# Patient Record
Sex: Female | Born: 1945
Health system: Southern US, Community
[De-identification: ages and names within clinical notes are randomized; demographics above are authoritative.]

## PROBLEM LIST (undated history)

## (undated) DIAGNOSIS — E785 Hyperlipidemia, unspecified: Secondary | ICD-10-CM

## (undated) DIAGNOSIS — M069 Rheumatoid arthritis, unspecified: Secondary | ICD-10-CM

## (undated) DIAGNOSIS — N289 Disorder of kidney and ureter, unspecified: Secondary | ICD-10-CM

## (undated) DIAGNOSIS — K2970 Gastritis, unspecified, without bleeding: Secondary | ICD-10-CM

## (undated) DIAGNOSIS — M869 Osteomyelitis, unspecified: Secondary | ICD-10-CM

## (undated) DIAGNOSIS — I10 Essential (primary) hypertension: Secondary | ICD-10-CM

## (undated) DIAGNOSIS — T7840XA Allergy, unspecified, initial encounter: Secondary | ICD-10-CM

## (undated) DIAGNOSIS — Z86010 Personal history of colonic polyps: Secondary | ICD-10-CM

## (undated) DIAGNOSIS — M1990 Unspecified osteoarthritis, unspecified site: Secondary | ICD-10-CM

## (undated) DIAGNOSIS — B9681 Helicobacter pylori [H. pylori] as the cause of diseases classified elsewhere: Secondary | ICD-10-CM

## (undated) DIAGNOSIS — J45909 Unspecified asthma, uncomplicated: Secondary | ICD-10-CM

## (undated) DIAGNOSIS — A159 Respiratory tuberculosis unspecified: Secondary | ICD-10-CM

## (undated) DIAGNOSIS — L03211 Cellulitis of face: Secondary | ICD-10-CM

## (undated) DIAGNOSIS — R7989 Other specified abnormal findings of blood chemistry: Secondary | ICD-10-CM

## (undated) DIAGNOSIS — E559 Vitamin D deficiency, unspecified: Secondary | ICD-10-CM

## (undated) DIAGNOSIS — R7689 Other specified abnormal immunological findings in serum: Secondary | ICD-10-CM

## (undated) DIAGNOSIS — M810 Age-related osteoporosis without current pathological fracture: Secondary | ICD-10-CM

## (undated) DIAGNOSIS — M109 Gout, unspecified: Secondary | ICD-10-CM

## (undated) HISTORY — DX: Other specified abnormal findings of blood chemistry: R79.89

## (undated) HISTORY — DX: Allergy, unspecified, initial encounter: T78.40XA

## (undated) HISTORY — PX: UPPER GASTROINTESTINAL ENDOSCOPY: SHX188

## (undated) HISTORY — DX: Vitamin D deficiency, unspecified: E55.9

## (undated) HISTORY — PX: OTHER SURGICAL HISTORY: SHX169

## (undated) HISTORY — PX: BREAST LUMPECTOMY: SHX2

## (undated) HISTORY — DX: Unspecified asthma, uncomplicated: J45.909

## (undated) HISTORY — DX: Cellulitis of face: L03.211

## (undated) HISTORY — DX: Rheumatoid arthritis, unspecified: M06.9

## (undated) HISTORY — DX: Respiratory tuberculosis unspecified: A15.9

## (undated) HISTORY — DX: Helicobacter pylori (H. pylori) as the cause of diseases classified elsewhere: B96.81

## (undated) HISTORY — DX: Personal history of colonic polyps: Z86.010

## (undated) HISTORY — DX: Age-related osteoporosis without current pathological fracture: M81.0

## (undated) HISTORY — DX: Gastritis, unspecified, without bleeding: K29.70

## (undated) HISTORY — PX: COLONOSCOPY: SHX174

## (undated) HISTORY — DX: Hyperlipidemia, unspecified: E78.5

## (undated) HISTORY — PX: JOINT REPLACEMENT: SHX530

## (undated) HISTORY — DX: Osteomyelitis, unspecified: M86.9

## (undated) HISTORY — DX: Essential (primary) hypertension: I10

## (undated) HISTORY — DX: Other specified abnormal immunological findings in serum: R76.89

---

## 2012-11-17 ENCOUNTER — Encounter (HOSPITAL_COMMUNITY): Payer: Self-pay | Admitting: *Deleted

## 2012-11-17 ENCOUNTER — Emergency Department (HOSPITAL_COMMUNITY): Payer: Self-pay

## 2012-11-17 ENCOUNTER — Inpatient Hospital Stay (HOSPITAL_COMMUNITY)
Admission: EM | Admit: 2012-11-17 | Discharge: 2012-11-19 | DRG: 603 | Disposition: A | Discharge status: Home / Self Care | Payer: MEDICAID | Attending: Family Medicine | Admitting: Family Medicine

## 2012-11-17 DIAGNOSIS — N289 Disorder of kidney and ureter, unspecified: Secondary | ICD-10-CM | POA: Diagnosis present

## 2012-11-17 DIAGNOSIS — F17200 Nicotine dependence, unspecified, uncomplicated: Secondary | ICD-10-CM | POA: Diagnosis present

## 2012-11-17 DIAGNOSIS — I10 Essential (primary) hypertension: Secondary | ICD-10-CM | POA: Diagnosis present

## 2012-11-17 DIAGNOSIS — K089 Disorder of teeth and supporting structures, unspecified: Secondary | ICD-10-CM | POA: Diagnosis present

## 2012-11-17 DIAGNOSIS — M129 Arthropathy, unspecified: Secondary | ICD-10-CM | POA: Diagnosis present

## 2012-11-17 DIAGNOSIS — K029 Dental caries, unspecified: Secondary | ICD-10-CM | POA: Diagnosis present

## 2012-11-17 DIAGNOSIS — M109 Gout, unspecified: Secondary | ICD-10-CM | POA: Diagnosis present

## 2012-11-17 DIAGNOSIS — H532 Diplopia: Secondary | ICD-10-CM | POA: Diagnosis present

## 2012-11-17 DIAGNOSIS — L03211 Cellulitis of face: Secondary | ICD-10-CM

## 2012-11-17 DIAGNOSIS — L0201 Cutaneous abscess of face: Principal | ICD-10-CM

## 2012-11-17 HISTORY — DX: Disorder of kidney and ureter, unspecified: N28.9

## 2012-11-17 HISTORY — DX: Unspecified osteoarthritis, unspecified site: M19.90

## 2012-11-17 HISTORY — DX: Essential (primary) hypertension: I10

## 2012-11-17 HISTORY — DX: Cellulitis of face: L03.211

## 2012-11-17 LAB — CBC
HCT: 38 % (ref 36.0–46.0)
MCHC: 35.3 g/dL (ref 30.0–36.0)
MCV: 85.6 fL (ref 78.0–100.0)
Platelets: 343 10*3/uL (ref 150–400)
RDW: 13.5 % (ref 11.5–15.5)

## 2012-11-17 LAB — CBC WITH DIFFERENTIAL/PLATELET
Eosinophils Absolute: 0.1 10*3/uL (ref 0.0–0.7)
Eosinophils Relative: 1 % (ref 0–5)
HCT: 39.8 % (ref 36.0–46.0)
Lymphs Abs: 1.4 10*3/uL (ref 0.7–4.0)
MCH: 30.5 pg (ref 26.0–34.0)
MCV: 85.6 fL (ref 78.0–100.0)
Monocytes Absolute: 0.6 10*3/uL (ref 0.1–1.0)
Platelets: 392 10*3/uL (ref 150–400)
RBC: 4.65 MIL/uL (ref 3.87–5.11)

## 2012-11-17 LAB — CREATININE, SERUM: GFR calc non Af Amer: >90 mL/min (ref >90)

## 2012-11-17 LAB — URINALYSIS, ROUTINE W REFLEX MICROSCOPIC
Leukocytes, UA: NEGATIVE
Nitrite: NEGATIVE
Specific Gravity, Urine: 1.012 (ref 1.005–1.030)
Urobilinogen, UA: 0.2 mg/dL (ref 0.0–1.0)
pH: 7.5 (ref 5.0–8.0)

## 2012-11-17 LAB — COMPREHENSIVE METABOLIC PANEL
ALT: 8 U/L (ref 0–35)
BUN: 5 mg/dL — ABNORMAL LOW (ref 6–23)
CO2: 25 mEq/L (ref 19–32)
Calcium: 9.3 mg/dL (ref 8.4–10.5)
Creatinine, Ser: 0.53 mg/dL (ref 0.50–1.10)
GFR calc Af Amer: >90 mL/min (ref >90)
GFR calc non Af Amer: >90 mL/min (ref >90)
Glucose, Bld: 96 mg/dL (ref 70–99)
Sodium: 139 mEq/L (ref 135–145)
Total Protein: 8.9 g/dL — ABNORMAL HIGH (ref 6.0–8.3)

## 2012-11-17 LAB — URINE MICROSCOPIC-ADD ON

## 2012-11-17 MED ORDER — CLINDAMYCIN PHOSPHATE 600 MG/50ML IV SOLN
600.0000 mg | Freq: Once | INTRAVENOUS | Status: AC
  Administered 2012-11-17: 600 mg via INTRAVENOUS
  Filled 2012-11-17: qty 50

## 2012-11-17 MED ORDER — SODIUM CHLORIDE 0.9 % IJ SOLN
3.0000 mL | Freq: Two times a day (BID) | INTRAMUSCULAR | Status: DC
  Administered 2012-11-17 – 2012-11-19 (×4): 3 mL via INTRAVENOUS

## 2012-11-17 MED ORDER — HYDROCODONE-ACETAMINOPHEN 5-325 MG PO TABS: 1.0000 | ORAL_TABLET | Freq: Once | ORAL | Status: DC

## 2012-11-17 MED ORDER — LABETALOL HCL 5 MG/ML IV SOLN
10.0000 mg | INTRAVENOUS | Status: DC | PRN
  Administered 2012-11-17: 10 mg via INTRAVENOUS
  Filled 2012-11-17 (×2): qty 4

## 2012-11-17 MED ORDER — CLINDAMYCIN PHOSPHATE 600 MG/50ML IV SOLN
600.0000 mg | Freq: Three times a day (TID) | INTRAVENOUS | Status: DC
  Administered 2012-11-18 – 2012-11-19 (×5): 600 mg via INTRAVENOUS
  Filled 2012-11-17 (×7): qty 50

## 2012-11-17 MED ORDER — MORPHINE SULFATE 4 MG/ML IJ SOLN
4.0000 mg | Freq: Once | INTRAMUSCULAR | Status: AC
  Administered 2012-11-17: 4 mg via INTRAVENOUS
  Filled 2012-11-17: qty 1

## 2012-11-17 MED ORDER — HEPARIN SODIUM (PORCINE) 5000 UNIT/ML IJ SOLN
5000.0000 [IU] | Freq: Three times a day (TID) | INTRAMUSCULAR | Status: DC
  Administered 2012-11-17 – 2012-11-18 (×2): 5000 [IU] via SUBCUTANEOUS
  Filled 2012-11-17 (×5): qty 1

## 2012-11-17 MED ORDER — MORPHINE SULFATE 2 MG/ML IJ SOLN: 2.0000 mg | INTRAMUSCULAR | Status: DC | PRN

## 2012-11-17 MED ORDER — LABETALOL HCL 5 MG/ML IV SOLN
10.0000 mg | Freq: Once | INTRAVENOUS | Status: AC
  Administered 2012-11-17: 10 mg via INTRAVENOUS
  Filled 2012-11-17: qty 4

## 2012-11-17 NOTE — ED Notes (Signed)
Called report to unit 5W. 

## 2012-11-17 NOTE — ED Notes (Signed)
Pt here for left side facial swelling that she first noticed this am and it has become worse throughout the day. Small amount of swelling and redness noted to face, reports having upper dental pain in that area. Airway is intact. Hypertensive at triage, manual bp 240/130 pt reports hx of htn but not currently taking meds.

## 2012-11-17 NOTE — H&P (Signed)
[facility] History and Physical  Noralee Dutko YNW:[id number] DOB: 1945-07-30 DOA: 11/17/2012  Referring physician: ED PCP: No PCP Per Patient   Chief Complaint: Facial swelling  HPI: Kristy Oliver is a 67 y.o. female with poor dentition and a history of HTN but not taking meds for > 9 months.  Patient presents to the ED with L facial swelling, redness, and pain.  Patient has chronic dental problems that have been worse for the last day.  In the ED her BP was initially 241/157 which improved to 168/85 with IV labetalol.  Hospitalist has been asked to admit.  Review of Systems: No: SOB, difficulty speaking, cp abd pain, N/V/D, does have several weeks of generalized weakness and episodic side-by-side double vision.  Past Medical History  Diagnosis Date  . Hypertension   . Arthritis   . Renal disorder    Past Surgical History  Procedure Laterality Date  . Breast lumpectomy     Social History:  reports that she has been smoking Cigarettes.  She has been smoking about 0.00 packs per day. She does not have any smokeless tobacco history on file. She reports that  drinks alcohol. She reports that she does not use illicit drugs.  Allergies  Allergen Reactions  . Iodinated Diagnostic Agents     unknown  . Motrin (Ibuprofen)     States she is only allergic to 800 mg formulation  . Shrimp (Shellfish Allergy) Hives, Swelling and Rash    History reviewed. No pertinent family history.  Prior to Admission medications   Medication Sig Start Date End Date Taking? Authorizing Provider  ibuprofen (ADVIL,MOTRIN) 200 MG tablet Take 400 mg by mouth every 6 (six) hours as needed for pain.   Yes Historical Provider, MD   Physical Exam: Filed Vitals:   11/17/12 1845 11/17/12 1900 11/17/12 1915 11/17/12 1935  BP: 194/102 177/90 179/85 168/85  Pulse: 81 77 83   Temp:      TempSrc:      Resp: 20 18 24 19   SpO2: 92% 93% 95% 93%    General:  NAD, resting comfortably in bed Eyes:  PEERLA EOMI ENT: mucous membranes moist, very poor dentition with receeding gum line, no obvious intraoral abscess Neck: supple w/o JVD Cardiovascular: RRR w/o MRG Respiratory: CTA B Abdomen: soft, nt, nd, bs+ Skin: no rash nor lesion Musculoskeletal: MAE, tophaceous gout noted in all extremities Psychiatric: normal tone and affect Neurologic: AAOx3, grossly non-focal  Labs on Admission:  Basic Metabolic Panel:  Recent Labs Lab 11/17/12 1657  NA 139  K 3.0*  CL 101  CO2 25  GLUCOSE 96  BUN 5*  CREATININE 0.53  CALCIUM 9.3   Liver Function Tests:  Recent Labs Lab 11/17/12 1657  AST 18  ALT 8  ALKPHOS 147*  BILITOT 0.4  PROT 8.9*  ALBUMIN 3.6   No results found for this basename: LIPASE, AMYLASE,  in the last 168 hours No results found for this basename: AMMONIA,  in the last 168 hours CBC:  Recent Labs Lab 11/17/12 1657  WBC 11.4*  NEUTROABS 9.3*  HGB 14.2  HCT 39.8  MCV 85.6  PLT 392   Cardiac Enzymes: No results found for this basename: CKTOTAL, CKMB, CKMBINDEX, TROPONINI,  in the last 168 hours  BNP (last 3 results) No results found for this basename: PROBNP,  in the last 8760 hours CBG: No results found for this basename: GLUCAP,  in the last 168 hours  Radiological Exams on Admission: Ct Head Wo  Contrast  11/17/2012   *RADIOLOGY REPORT*  Clinical Data:  Left facial swelling.  Double vision.  CT HEAD WITHOUT CONTRAST CT MAXILLOFACIAL WITHOUT CONTRAST  Technique:  Multidetector CT imaging of the head and maxillofacial structures were performed using the standard protocol without intravenous contrast. Multiplanar CT image reconstructions of the maxillofacial structures were also generated.  Comparison:  None  CT HEAD  Findings: Patchy hypodensity throughout the cerebral white matter bilaterally most consistent with chronic microvascular ischemia. No acute infarct.  Negative for hemorrhage or mass lesion.  Ventricle size is normal.  No midline shift.  No  skull lesion.  IMPRESSION: Moderate to advanced chronic microvascular ischemia in the white matter.  No acute abnormality.  CT MAXILLOFACIAL  Findings:   Extensive dental disease with multiple caries and periapical lucency around multiple teeth. Soft tissue swelling over the left maxilla consistent with cellulitis.  No definite abscess. Infection most likely related to dental infection in the left maxilla.  Negative for fracture.  Chronic sinusitis with mild mucosal edema in the paranasal sinuses. Mastoid sinuses are clear.  Degenerative change in the temporal mandibular joint bilaterally.  Negative for soft tissue mass or edema.  Orbital contents are normal.  The visualized intracranial contents are negative.  IMPRESSION: Extensive dental infection. Cellulitis left maxilla most likely related to dental infection.  Chronic sinusitis  No acute abnormality.   Original Report Authenticated By: Janeece Riggers, M.D.   Ct Maxillofacial Wo Cm  11/17/2012   *RADIOLOGY REPORT*  Clinical Data:  Left facial swelling.  Double vision.  CT HEAD WITHOUT CONTRAST CT MAXILLOFACIAL WITHOUT CONTRAST  Technique:  Multidetector CT imaging of the head and maxillofacial structures were performed using the standard protocol without intravenous contrast. Multiplanar CT image reconstructions of the maxillofacial structures were also generated.  Comparison:  None  CT HEAD  Findings: Patchy hypodensity throughout the cerebral white matter bilaterally most consistent with chronic microvascular ischemia. No acute infarct.  Negative for hemorrhage or mass lesion.  Ventricle size is normal.  No midline shift.  No skull lesion.  IMPRESSION: Moderate to advanced chronic microvascular ischemia in the white matter.  No acute abnormality.  CT MAXILLOFACIAL  Findings:   Extensive dental disease with multiple caries and periapical lucency around multiple teeth. Soft tissue swelling over the left maxilla consistent with cellulitis.  No definite abscess.  Infection most likely related to dental infection in the left maxilla.  Negative for fracture.  Chronic sinusitis with mild mucosal edema in the paranasal sinuses. Mastoid sinuses are clear.  Degenerative change in the temporal mandibular joint bilaterally.  Negative for soft tissue mass or edema.  Orbital contents are normal.  The visualized intracranial contents are negative.  IMPRESSION: Extensive dental infection. Cellulitis left maxilla most likely related to dental infection.  Chronic sinusitis  No acute abnormality.   Original Report Authenticated By: Janeece Riggers, M.D.    EKG: Independently reviewed.  Assessment/Plan Principal Problem:   Facial cellulitis Active Problems:   Poor dentition   HTN (hypertension), malignant   1. Facial cellulitis - putting patient on clindamycin, keeping patient NPO after midnight, Oral surgeon is coming on call tomorrow at 7am he should be consulted at that time to see if he can perform the needed tooth extraction(s) to gain source control of the infection. 2. Malignant HTN - tele monitor, has responded to labetalol, have ordered additional labetalol PRN for SBP > 200 or DBP > 110.  Likely will need to be started on PO HTN meds and get  routine follow up as outpatient for this long term. 3. Gout - needs to be treated long term with meds such as allopurinol, not in acute gout attack at this point so will hold off for now on starting this.    Code Status: Full Code (must indicate code status--if unknown or must be presumed, indicate so) Family Communication: Spoke with grandson at bedside (indicate person spoken with, if applicable, with phone number if by telephone) Disposition Plan: Admit to inpatient (indicate anticipated LOS)  Time spent: 70 min  Mahasin Riviere M. [facility] Pager [phone]  If 7PM-7AM, please contact night-coverage [email or web address] Password [facility] 11/17/2012, 8:12 PM

## 2012-11-17 NOTE — ED Notes (Signed)
Patient states she has a broken upper left tooth that has not been tended to yet. She has left facial swelling with redness. Patient states she has been having double vision left greater than right for approx a month now.

## 2012-11-17 NOTE — ED Notes (Signed)
Family at bedside. 

## 2012-11-17 NOTE — ED Provider Notes (Signed)
History    CSN: [id number] Arrival date & time 11/17/12  1548  First MD Initiated Contact with Patient 11/17/12 1642     Chief Complaint  Patient presents with  . Facial Swelling  . Hypertension   (Consider location/radiation/quality/duration/timing/severity/associated sxs/prior Treatment) HPI Pt with poor dentition and known history of HTN not taking meds >9 months. P/w L facial swelling, redness, and pain. Pt states she has chronic dental that is worse for the last day. No intraoral swelling, SOB, difficulty speaking. No HA, neck pain, SOB, CP, abd pain, N/V/D, focal weakness or numbness. Pt c/o several weeks of generalized weakness and episodic side-by-side double vision. Past Medical History  Diagnosis Date  . Hypertension   . Arthritis   . Renal disorder    Past Surgical History  Procedure Laterality Date  . Breast lumpectomy     History reviewed. No pertinent family history. History  Substance Use Topics  . Smoking status: Current Every Day Smoker    Types: Cigarettes  . Smokeless tobacco: Not on file  . Alcohol Use: Yes     Comment: occ   OB History   Grav Para Term Preterm Abortions TAB SAB Ect Mult Living                 Review of Systems  Constitutional: Negative for fever and chills.  HENT: Positive for facial swelling and dental problem. Negative for sore throat, trouble swallowing, neck pain, neck stiffness and voice change.   Eyes: Positive for visual disturbance.  Respiratory: Negative for shortness of breath and wheezing.   Cardiovascular: Negative for chest pain, palpitations and leg swelling.  Gastrointestinal: Negative for nausea, vomiting, abdominal pain and diarrhea.  Genitourinary: Negative for dysuria, frequency, hematuria and flank pain.  Musculoskeletal: Negative for myalgias and arthralgias.  Skin: Positive for color change. Negative for rash and wound.  Neurological: Negative for dizziness, syncope, weakness, light-headedness, numbness and  headaches.  All other systems reviewed and are negative.    Allergies  Iodinated diagnostic agents; Motrin; and Shrimp  Home Medications   Current Outpatient Rx  Name  Route  Sig  Dispense  Refill  . ibuprofen (ADVIL,MOTRIN) 200 MG tablet   Oral   Take 400 mg by mouth every 6 (six) hours as needed for pain.          BP 168/85  Pulse 83  Temp(Src) 99.5 F (37.5 C) (Oral)  Resp 19  SpO2 93% Physical Exam  Nursing note and vitals reviewed. Constitutional: She is oriented to person, place, and time. She appears well-developed and well-nourished. No distress.  HENT:  Head: Normocephalic and atraumatic.  Mouth/Throat: Oropharynx is clear and moist.  No intraoral swelling. Missing most teeth. Has several fractured superior anterior teeth that are TTP. No swelling or abscess.  +L infraorbital redness, swelling and tenderness. No definite abscess.   Eyes: EOM are normal. Pupils are equal, round, and reactive to light.  Neck: Normal range of motion. Neck supple.  No nuchal rigidity  Cardiovascular: Normal rate and regular rhythm.  Exam reveals no gallop and no friction rub.   No murmur heard. Pulmonary/Chest: Effort normal and breath sounds normal. No respiratory distress. She has no wheezes. She has no rales. She exhibits no tenderness.  Abdominal: Soft. Bowel sounds are normal. She exhibits no distension and no mass. There is no tenderness. There is no rebound and no guarding.  Musculoskeletal: Normal range of motion. She exhibits no edema and no tenderness.  Lymphadenopathy:  She has no cervical adenopathy.  Neurological: She is alert and oriented to person, place, and time.  5/5 motor  Skin: Skin is warm and dry. No rash noted. No erythema.  Psychiatric: She has a normal mood and affect. Her behavior is normal.    ED Course  Procedures (including critical care time) Labs Reviewed  CBC WITH DIFFERENTIAL - Abnormal; Notable for the following:    WBC 11.4 (*)     Neutrophils Relative % 81 (*)    Neutro Abs 9.3 (*)    All other components within normal limits  COMPREHENSIVE METABOLIC PANEL - Abnormal; Notable for the following:    Potassium 3.0 (*)    BUN 5 (*)    Total Protein 8.9 (*)    Alkaline Phosphatase 147 (*)    All other components within normal limits  URINALYSIS, ROUTINE W REFLEX MICROSCOPIC - Abnormal; Notable for the following:    Protein, ur 100 (*)    All other components within normal limits  URINE MICROSCOPIC-ADD ON   Ct Head Wo Contrast  11/17/2012   *RADIOLOGY REPORT*  Clinical Data:  Left facial swelling.  Double vision.  CT HEAD WITHOUT CONTRAST CT MAXILLOFACIAL WITHOUT CONTRAST  Technique:  Multidetector CT imaging of the head and maxillofacial structures were performed using the standard protocol without intravenous contrast. Multiplanar CT image reconstructions of the maxillofacial structures were also generated.  Comparison:  None  CT HEAD  Findings: Patchy hypodensity throughout the cerebral white matter bilaterally most consistent with chronic microvascular ischemia. No acute infarct.  Negative for hemorrhage or mass lesion.  Ventricle size is normal.  No midline shift.  No skull lesion.  IMPRESSION: Moderate to advanced chronic microvascular ischemia in the white matter.  No acute abnormality.  CT MAXILLOFACIAL  Findings:   Extensive dental disease with multiple caries and periapical lucency around multiple teeth. Soft tissue swelling over the left maxilla consistent with cellulitis.  No definite abscess. Infection most likely related to dental infection in the left maxilla.  Negative for fracture.  Chronic sinusitis with mild mucosal edema in the paranasal sinuses. Mastoid sinuses are clear.  Degenerative change in the temporal mandibular joint bilaterally.  Negative for soft tissue mass or edema.  Orbital contents are normal.  The visualized intracranial contents are negative.  IMPRESSION: Extensive dental infection. Cellulitis  left maxilla most likely related to dental infection.  Chronic sinusitis  No acute abnormality.   Original Report Authenticated By: Janeece Riggers, M.D.   Ct Maxillofacial Wo Cm  11/17/2012   *RADIOLOGY REPORT*  Clinical Data:  Left facial swelling.  Double vision.  CT HEAD WITHOUT CONTRAST CT MAXILLOFACIAL WITHOUT CONTRAST  Technique:  Multidetector CT imaging of the head and maxillofacial structures were performed using the standard protocol without intravenous contrast. Multiplanar CT image reconstructions of the maxillofacial structures were also generated.  Comparison:  None  CT HEAD  Findings: Patchy hypodensity throughout the cerebral white matter bilaterally most consistent with chronic microvascular ischemia. No acute infarct.  Negative for hemorrhage or mass lesion.  Ventricle size is normal.  No midline shift.  No skull lesion.  IMPRESSION: Moderate to advanced chronic microvascular ischemia in the white matter.  No acute abnormality.  CT MAXILLOFACIAL  Findings:   Extensive dental disease with multiple caries and periapical lucency around multiple teeth. Soft tissue swelling over the left maxilla consistent with cellulitis.  No definite abscess. Infection most likely related to dental infection in the left maxilla.  Negative for fracture.  Chronic sinusitis  with mild mucosal edema in the paranasal sinuses. Mastoid sinuses are clear.  Degenerative change in the temporal mandibular joint bilaterally.  Negative for soft tissue mass or edema.  Orbital contents are normal.  The visualized intracranial contents are negative.  IMPRESSION: Extensive dental infection. Cellulitis left maxilla most likely related to dental infection.  Chronic sinusitis  No acute abnormality.   Original Report Authenticated By: Janeece Riggers, M.D.   1. Facial cellulitis   2. Uncontrolled hypertension      Date: 11/17/2012  Rate: 79  Rhythm: normal sinus rhythm  QRS Axis: normal  Intervals: normal  ST/T Wave abnormalities:  normal  Conduction Disutrbances:none  Narrative Interpretation:   Old EKG Reviewed: no prev EKG  MDM  Discussed with Dr Julian Reil. Will see in ED and admit. Bp improved with IV labetalol    Loren Racer, MD 11/17/12 [phone]

## 2012-11-18 ENCOUNTER — Inpatient Hospital Stay (HOSPITAL_COMMUNITY): Payer: MEDICAID

## 2012-11-18 LAB — BASIC METABOLIC PANEL
BUN: 5 mg/dL — ABNORMAL LOW (ref 6–23)
Calcium: 8.8 mg/dL (ref 8.4–10.5)
Creatinine, Ser: 0.58 mg/dL (ref 0.50–1.10)
GFR calc non Af Amer: >90 mL/min (ref >90)
Glucose, Bld: 107 mg/dL — ABNORMAL HIGH (ref 70–99)

## 2012-11-18 LAB — CBC
HCT: 36 % (ref 36.0–46.0)
Hemoglobin: 12.7 g/dL (ref 12.0–15.0)
MCH: 30.2 pg (ref 26.0–34.0)
MCHC: 35.3 g/dL (ref 30.0–36.0)
RDW: 13.6 % (ref 11.5–15.5)

## 2012-11-18 MED ORDER — HEPARIN SODIUM (PORCINE) 5000 UNIT/ML IJ SOLN
5000.0000 [IU] | Freq: Three times a day (TID) | INTRAMUSCULAR | Status: DC
  Administered 2012-11-18 – 2012-11-19 (×2): 5000 [IU] via SUBCUTANEOUS
  Filled 2012-11-18 (×5): qty 1

## 2012-11-18 MED ORDER — CHLORHEXIDINE GLUCONATE 0.12 % MT SOLN
15.0000 mL | Freq: Two times a day (BID) | OROMUCOSAL | Status: DC
  Administered 2012-11-18 – 2012-11-19 (×3): 15 mL via OROMUCOSAL
  Filled 2012-11-18 (×5): qty 15

## 2012-11-18 MED ORDER — BIOTENE DRY MOUTH MT LIQD
15.0000 mL | Freq: Two times a day (BID) | OROMUCOSAL | Status: DC
  Administered 2012-11-18 (×2): 15 mL via OROMUCOSAL

## 2012-11-18 NOTE — Progress Notes (Signed)
Pt brought up from ed via stretcher. Pt's grandson at bedside. Pt oriented to room, safety video viewed. VS WDL except bp elevated. Pt given labetalol iv for elevated bp. rn will continue to monitor pt.

## 2012-11-18 NOTE — Consult Note (Signed)
Kristy Oliver is an 67 y.o. female.  NF:AOZH and swelling left cheek.   HPI: Patient began having facial swelling 2 days PTA.   Past Medical History  Diagnosis Date  . Hypertension   . Arthritis   . Renal disorder     Past Surgical History  Procedure Laterality Date  . Breast lumpectomy      History reviewed. No pertinent family history.  Social History:  reports that she has been smoking Cigarettes.  She has been smoking about 0.00 packs per day. She does not have any smokeless tobacco history on file. She reports that  drinks alcohol. She reports that she does not use illicit drugs.  Allergies:  Allergies  Allergen Reactions  . Iodinated Diagnostic Agents     unknown  . Motrin (Ibuprofen)     States she is only allergic to 800 mg formulation  . Shrimp (Shellfish Allergy) Hives, Swelling and Rash    Medications: I have reviewed the patient's current medications.  Results for orders placed during the hospital encounter of 11/17/12 (from the past 48 hour(s))  CBC WITH DIFFERENTIAL     Status: Abnormal   Collection Time    11/17/12  4:57 PM      Result Value Range   WBC 11.4 (*) 4.0 - 10.5 K/uL   RBC 4.65  3.87 - 5.11 MIL/uL   Hemoglobin 14.2  12.0 - 15.0 g/dL   HCT 08.6  57.8 - 46.9 %   MCV 85.6  78.0 - 100.0 fL   MCH 30.5  26.0 - 34.0 pg   MCHC 35.7  30.0 - 36.0 g/dL   RDW 62.9  52.8 - 41.3 %   Platelets 392  150 - 400 K/uL   Neutrophils Relative % 81 (*) 43 - 77 %   Neutro Abs 9.3 (*) 1.7 - 7.7 K/uL   Lymphocytes Relative 12  12 - 46 %   Lymphs Abs 1.4  0.7 - 4.0 K/uL   Monocytes Relative 5  3 - 12 %   Monocytes Absolute 0.6  0.1 - 1.0 K/uL   Eosinophils Relative 1  0 - 5 %   Eosinophils Absolute 0.1  0.0 - 0.7 K/uL   Basophils Relative 1  0 - 1 %   Basophils Absolute 0.1  0.0 - 0.1 K/uL  COMPREHENSIVE METABOLIC PANEL     Status: Abnormal   Collection Time    11/17/12  4:57 PM      Result Value Range   Sodium 139  135 - 145 mEq/L   Potassium 3.0  (*) 3.5 - 5.1 mEq/L   Chloride 101  96 - 112 mEq/L   CO2 25  19 - 32 mEq/L   Glucose, Bld 96  70 - 99 mg/dL   BUN 5 (*) 6 - 23 mg/dL   Creatinine, Ser 2.44  0.50 - 1.10 mg/dL   Calcium 9.3  8.4 - 01.0 mg/dL   Total Protein 8.9 (*) 6.0 - 8.3 g/dL   Albumin 3.6  3.5 - 5.2 g/dL   AST 18  0 - 37 U/L   ALT 8  0 - 35 U/L   Alkaline Phosphatase 147 (*) 39 - 117 U/L   Total Bilirubin 0.4  0.3 - 1.2 mg/dL   GFR calc non Af Amer >90  >90 mL/min   GFR calc Af Amer >90  >90 mL/min   Comment:            The eGFR has been calculated  using the CKD EPI equation.     This calculation has not been     validated in all clinical     situations.     eGFR's persistently     <90 mL/min signify     possible Chronic Kidney Disease.  URINALYSIS, ROUTINE W REFLEX MICROSCOPIC     Status: Abnormal   Collection Time    11/17/12  5:51 PM      Result Value Range   Color, Urine YELLOW  YELLOW   APPearance CLEAR  CLEAR   Specific Gravity, Urine 1.012  1.005 - 1.030   pH 7.5  5.0 - 8.0   Glucose, UA NEGATIVE  NEGATIVE mg/dL   Hgb urine dipstick NEGATIVE  NEGATIVE   Bilirubin Urine NEGATIVE  NEGATIVE   Ketones, ur NEGATIVE  NEGATIVE mg/dL   Protein, ur 782 (*) NEGATIVE mg/dL   Urobilinogen, UA 0.2  0.0 - 1.0 mg/dL   Nitrite NEGATIVE  NEGATIVE   Leukocytes, UA NEGATIVE  NEGATIVE  URINE MICROSCOPIC-ADD ON     Status: None   Collection Time    11/17/12  5:51 PM      Result Value Range   Squamous Epithelial / LPF RARE  RARE   RBC / HPF 3-6  <3 RBC/hpf   Bacteria, UA RARE  RARE  CBC     Status: Abnormal   Collection Time    11/17/12  8:52 PM      Result Value Range   WBC 11.6 (*) 4.0 - 10.5 K/uL   RBC 4.44  3.87 - 5.11 MIL/uL   Hemoglobin 13.4  12.0 - 15.0 g/dL   HCT 95.6  21.3 - 08.6 %   MCV 85.6  78.0 - 100.0 fL   MCH 30.2  26.0 - 34.0 pg   MCHC 35.3  30.0 - 36.0 g/dL   RDW 57.8  46.9 - 62.9 %   Platelets 343  150 - 400 K/uL  CREATININE, SERUM     Status: None   Collection Time    11/17/12   8:52 PM      Result Value Range   Creatinine, Ser 0.51  0.50 - 1.10 mg/dL   GFR calc non Af Amer >90  >90 mL/min   GFR calc Af Amer >90  >90 mL/min   Comment:            The eGFR has been calculated     using the CKD EPI equation.     This calculation has not been     validated in all clinical     situations.     eGFR's persistently     <90 mL/min signify     possible Chronic Kidney Disease.  CBC     Status: Abnormal   Collection Time    11/18/12  5:00 AM      Result Value Range   WBC 11.5 (*) 4.0 - 10.5 K/uL   RBC 4.20  3.87 - 5.11 MIL/uL   Hemoglobin 12.7  12.0 - 15.0 g/dL   HCT 52.8  41.3 - 24.4 %   MCV 85.7  78.0 - 100.0 fL   MCH 30.2  26.0 - 34.0 pg   MCHC 35.3  30.0 - 36.0 g/dL   RDW 01.0  27.2 - 53.6 %   Platelets 349  150 - 400 K/uL  BASIC METABOLIC PANEL     Status: Abnormal   Collection Time    11/18/12  5:00 AM      Result  Value Range   Sodium 137  135 - 145 mEq/L   Potassium 3.0 (*) 3.5 - 5.1 mEq/L   Chloride 100  96 - 112 mEq/L   CO2 26  19 - 32 mEq/L   Glucose, Bld 107 (*) 70 - 99 mg/dL   BUN 5 (*) 6 - 23 mg/dL   Creatinine, Ser 1.61  0.50 - 1.10 mg/dL   Calcium 8.8  8.4 - 09.6 mg/dL   GFR calc non Af Amer >90  >90 mL/min   GFR calc Af Amer >90  >90 mL/min   Comment:            The eGFR has been calculated     using the CKD EPI equation.     This calculation has not been     validated in all clinical     situations.     eGFR's persistently     <90 mL/min signify     possible Chronic Kidney Disease.    Dg Orthopantogram  11/18/2012   *RADIOLOGY REPORT*  Clinical Data: 67 year old female with left facial swelling. Dental caries.  ORTHOPANTOGRAM/PANORAMIC  Comparison: Face CT 11/17/2012.  Findings: Multifocal poor dentition read demonstrated, with dental caries diffusely affecting the maxillary incisors and bicuspid. Large dental caries affecting the right maxillary molar.  Severe dental caries and apical lucency affecting the right mandible molar.   IMPRESSION: Widespread poor dentition.   Original Report Authenticated By: Erskine Speed, M.D.   Ct Head Wo Contrast  11/17/2012   *RADIOLOGY REPORT*  Clinical Data:  Left facial swelling.  Double vision.  CT HEAD WITHOUT CONTRAST CT MAXILLOFACIAL WITHOUT CONTRAST  Technique:  Multidetector CT imaging of the head and maxillofacial structures were performed using the standard protocol without intravenous contrast. Multiplanar CT image reconstructions of the maxillofacial structures were also generated.  Comparison:  None  CT HEAD  Findings: Patchy hypodensity throughout the cerebral white matter bilaterally most consistent with chronic microvascular ischemia. No acute infarct.  Negative for hemorrhage or mass lesion.  Ventricle size is normal.  No midline shift.  No skull lesion.  IMPRESSION: Moderate to advanced chronic microvascular ischemia in the white matter.  No acute abnormality.  CT MAXILLOFACIAL  Findings:   Extensive dental disease with multiple caries and periapical lucency around multiple teeth. Soft tissue swelling over the left maxilla consistent with cellulitis.  No definite abscess. Infection most likely related to dental infection in the left maxilla.  Negative for fracture.  Chronic sinusitis with mild mucosal edema in the paranasal sinuses. Mastoid sinuses are clear.  Degenerative change in the temporal mandibular joint bilaterally.  Negative for soft tissue mass or edema.  Orbital contents are normal.  The visualized intracranial contents are negative.  IMPRESSION: Extensive dental infection. Cellulitis left maxilla most likely related to dental infection.  Chronic sinusitis  No acute abnormality.   Original Report Authenticated By: Janeece Riggers, M.D.   Ct Maxillofacial Wo Cm  11/17/2012   *RADIOLOGY REPORT*  Clinical Data:  Left facial swelling.  Double vision.  CT HEAD WITHOUT CONTRAST CT MAXILLOFACIAL WITHOUT CONTRAST  Technique:  Multidetector CT imaging of the head and maxillofacial  structures were performed using the standard protocol without intravenous contrast. Multiplanar CT image reconstructions of the maxillofacial structures were also generated.  Comparison:  None  CT HEAD  Findings: Patchy hypodensity throughout the cerebral white matter bilaterally most consistent with chronic microvascular ischemia. No acute infarct.  Negative for hemorrhage or mass lesion.  Ventricle size is normal.  No midline shift.  No skull lesion.  IMPRESSION: Moderate to advanced chronic microvascular ischemia in the white matter.  No acute abnormality.  CT MAXILLOFACIAL  Findings:   Extensive dental disease with multiple caries and periapical lucency around multiple teeth. Soft tissue swelling over the left maxilla consistent with cellulitis.  No definite abscess. Infection most likely related to dental infection in the left maxilla.  Negative for fracture.  Chronic sinusitis with mild mucosal edema in the paranasal sinuses. Mastoid sinuses are clear.  Degenerative change in the temporal mandibular joint bilaterally.  Negative for soft tissue mass or edema.  Orbital contents are normal.  The visualized intracranial contents are negative.  IMPRESSION: Extensive dental infection. Cellulitis left maxilla most likely related to dental infection.  Chronic sinusitis  No acute abnormality.   Original Report Authenticated By: Janeece Riggers, M.D.    @ROS @ Blood pressure 172/78, pulse 75, temperature 99.5 F (37.5 C), temperature source Oral, resp. rate 18, height 5\' 1"  (1.549 m), weight 66.2 kg (145 lb 15.1 oz), SpO2 97.00%. General appearance: alert, cooperative and no distress Head: Normocephalic, without obvious abnormality, atraumatic Eyes: negative Ears: normal TM's and external ear canals both ears Nose: Nares normal. Septum midline. Mucosa normal. No drainage or sinus tenderness. Throat: Moderate left canine space edema. Eye not swollen shut. Severe dental caries. No trismus. No purulence, drainage, or  exudate. Neck: no adenopathy, no JVD and supple, symmetrical, trachea midline Resp: clear to auscultation bilaterally Cardio: regular rate and rhythm, S1, S2 normal, no murmur, click, rub or gallop  Assessment/Plan: 66 YO BF HTN, Arthritis, Renal disorder with left canine space infection secondary to dental caries and abscesses. Recommend continue IV or PO Cleocin. Patient will need dental extractions in OR. Will try to schedule for Monday, June 30.   Gordie Belvin M 11/18/2012, 3:08 PM

## 2012-11-18 NOTE — Progress Notes (Signed)
2:36 PM I agree with HPI/GPe and A/P per Dr. Julian Reil  PAtient unhappy at being NPO.  States was told in the ED that a dental surgeon would be by to take care of this issue.  No n/v/cp/sob/blurred or double vision      HEENT-diffuse swelling of the L side of the face.  Poor dentition with multiple missing teeth.    Patient Active Problem List   Diagnosis Date Noted  . Facial cellulitis 11/17/2012  . Poor dentition 11/17/2012  . HTN (hypertension), malignant 11/17/2012   Spoke with Dr. Barbette Merino regarding patient this am.  HE recommend Opthantogram, which has been ordered.  We will probably be able to feed the patient once we hear back from Dr. Barbette Merino.  Further disposition dependant on him  Pleas Koch, MD [facility] [phone]

## 2012-11-19 MED ORDER — CHLORHEXIDINE GLUCONATE 0.12 % MT SOLN: 15.0000 mL | Freq: Two times a day (BID) | OROMUCOSAL | Status: DC

## 2012-11-19 MED ORDER — LISINOPRIL 20 MG PO TABS: 20.0000 mg | ORAL_TABLET | Freq: Every day | ORAL | Status: DC

## 2012-11-19 MED ORDER — CLINDAMYCIN HCL 150 MG PO CAPS: 450.0000 mg | ORAL_CAPSULE | Freq: Three times a day (TID) | ORAL | Status: DC

## 2012-11-19 NOTE — Progress Notes (Signed)
Kristy Oliver to be D/C'd Home per MD order.  Discharge instructions reviewed and discussed with the patient and daughter n law, Okey Dupre., all questions and concerns answered. Copy of instructions and scripts given to patient. Patients skin is clean, dry and intact no evidence of skin break down.  IV site discontinued and catheter remains intact. Site without signs and symptoms of complications. Dressing and pressure applied. Patient escorted to car by NT in a wheelchair,  no distress noted upon discharge.  Nena Polio Garner Nash 11/19/2012

## 2012-11-19 NOTE — Progress Notes (Signed)
   CARE MANAGEMENT NOTE 11/19/2012  Patient:  Kristy Oliver, Kristy Oliver   Account Number:  [id number]  Date Initiated:  11/19/2012  Documentation initiated by:  [facility]  Subjective/Objective Assessment:   cellulitis     Action/Plan:   Anticipated DC Date:  11/19/2012   Anticipated DC Plan:  HOME/SELF CARE      DC Planning Services  CM consult  Medication Assistance  [facility]      Choice offered to / List presented to:             Status of service:  Completed, signed off Medicare Important Message given?   (If response is "NO", the following Medicare IM given date fields will be blank) Date Medicare IM given:   Date Additional Medicare IM given:    Discharge Disposition:  HOME W HOME HEALTH SERVICES  Per UR Regulation:    If discussed at Long Length of Stay Meetings, dates discussed:    Comments:

## 2012-11-19 NOTE — Discharge Summary (Signed)
Physician Discharge Summary  Brandan Glauber BJY:[id number] DOB: November 19, 1945 DOA: 11/17/2012  PCP: No PCP Per Patient  Admit date: 11/17/2012 Discharge date: 11/19/2012  Time spent: 37 minutes  Recommendations for Outpatient Follow-up:  1. Continue clindamycin 450 mg 3 times a day for 10 days or until seen by oral surgeon 2. Restarted his lisinopril 20 mg daily 3. Need to place and basic metabolic panel 1-2 weeks  Discharge Diagnoses:  Principal Problem:   Facial cellulitis Active Problems:   Poor dentition   HTN (hypertension), malignant   Discharge Condition: Good  Diet recommendation: Heart healthy  Filed Weights   11/17/12 2143  Weight: 66.2 kg (145 lb 15.1 oz)    History of present illness:  This 67 year old African American female recently transplanted from [place] presented to the hospital x-ray 6 2014 with chronic dental swelling that was worse that 3 days. She states that she's had dental infection and pain about 7 months ago but neglected to have this looked at. She presented with obvious over left facial swelling extending up to the left eye and puffiness She reports history of being hospitalized for 2-3 months sometime last year for acute renal failure which eventually resolved off of dialysis. On admission she was hypertensive to the 200s and given labetalol. Oral surgeon Dr. Ocie Doyne saw the patient and recommended interval surgery once swelling had resolved in the OR. He recommended continuation of antibiotics and also for her she could be discharged. Case manager has been elicited to help patient with medications, and she states she has no material resources, has a grandson and town but for rest of the relatives are scattered and she may not able to afford her medications Patient understands clearly antibiotics will not be curative in this condition rather she will have to have dental surgery and she is aware of this  Procedures:  Opthantogram 11/19/2012  showed widespread poor dentition with severe dental caries in April of lucency affecting right mandibular molar   Consultations:  Ocie Doyne Oral surgeon  Discharge Exam: Filed Vitals:   11/18/12 0623 11/18/12 1700 11/18/12 2138 11/19/12 0611  BP: 172/78 174/87 153/84 172/80  Pulse: 75 76 70 69  Temp: 99.5 F (37.5 C) 98.7 F (37.1 C) 99 F (37.2 C) 99.1 F (37.3 C)  TempSrc: Oral Oral Oral Oral  Resp: 18 18 18 20   Height:      Weight:      SpO2: 97% 98% 90% 96%   Well sitting up in No distress  General: L facial swelling significantly decreased Cardiovascular:  s1 s2 no m/r/g Respiratory: cta b  Discharge Instructions  Discharge Orders   Future Orders Complete By Expires     Call MD for:  persistant dizziness or light-headedness  As directed     Call MD for:  persistant nausea and vomiting  As directed     Call MD for:  redness, tenderness, or signs of infection (pain, swelling, redness, odor or green/yellow discharge around incision site)  As directed     Call MD for:  severe uncontrolled pain  As directed     Diet - low sodium heart healthy  As directed     Discharge instructions  As directed     Comments:      Need to follow up with Dr. Barbette Merino for Definitive management of your mouth infection-the antibiotics WILL NOT cure this Start back Lisinopril 20 daily-u need labs in about 1 week.  I will send you with 6 refills  Please ensure you take your medications    Increase activity slowly  As directed         Medication List         chlorhexidine 0.12 % solution  Commonly known as:  PERIDEX  Use as directed 15 mLs in the mouth or throat 2 (two) times daily.     clindamycin 150 MG capsule  Commonly known as:  CLEOCIN  Take 3 capsules (450 mg total) by mouth 3 (three) times daily.     ibuprofen 200 MG tablet  Commonly known as:  ADVIL,MOTRIN  Take 400 mg by mouth every 6 (six) hours as needed for pain.     lisinopril 20 MG tablet  Commonly known as:   PRINIVIL,ZESTRIL  Take 1 tablet (20 mg total) by mouth daily.       Allergies  Allergen Reactions  . Iodinated Diagnostic Agents     unknown  . Motrin (Ibuprofen)     States she is only allergic to 800 mg formulation  . Shrimp (Shellfish Allergy) Hives, Swelling and Rash       Follow-up Information   Schedule an appointment as soon as possible for a visit with Georgia Lopes, DDS.   Contact information:   [address] [place] [address] [phone]        The results of significant diagnostics from this hospitalization (including imaging, microbiology, ancillary and laboratory) are listed below for reference.    Significant Diagnostic Studies: Dg Orthopantogram  11/18/2012   *RADIOLOGY REPORT*  Clinical Data: 67 year old female with left facial swelling. Dental caries.  ORTHOPANTOGRAM/PANORAMIC  Comparison: Face CT 11/17/2012.  Findings: Multifocal poor dentition read demonstrated, with dental caries diffusely affecting the maxillary incisors and bicuspid. Large dental caries affecting the right maxillary molar.  Severe dental caries and apical lucency affecting the right mandible molar.  IMPRESSION: Widespread poor dentition.   Original Report Authenticated By: Erskine Speed, M.D.   Ct Head Wo Contrast  11/17/2012   *RADIOLOGY REPORT*  Clinical Data:  Left facial swelling.  Double vision.  CT HEAD WITHOUT CONTRAST CT MAXILLOFACIAL WITHOUT CONTRAST  Technique:  Multidetector CT imaging of the head and maxillofacial structures were performed using the standard protocol without intravenous contrast. Multiplanar CT image reconstructions of the maxillofacial structures were also generated.  Comparison:  None  CT HEAD  Findings: Patchy hypodensity throughout the cerebral white matter bilaterally most consistent with chronic microvascular ischemia. No acute infarct.  Negative for hemorrhage or mass lesion.  Ventricle size is normal.  No midline shift.  No skull lesion.  IMPRESSION:  Moderate to advanced chronic microvascular ischemia in the white matter.  No acute abnormality.  CT MAXILLOFACIAL  Findings:   Extensive dental disease with multiple caries and periapical lucency around multiple teeth. Soft tissue swelling over the left maxilla consistent with cellulitis.  No definite abscess. Infection most likely related to dental infection in the left maxilla.  Negative for fracture.  Chronic sinusitis with mild mucosal edema in the paranasal sinuses. Mastoid sinuses are clear.  Degenerative change in the temporal mandibular joint bilaterally.  Negative for soft tissue mass or edema.  Orbital contents are normal.  The visualized intracranial contents are negative.  IMPRESSION: Extensive dental infection. Cellulitis left maxilla most likely related to dental infection.  Chronic sinusitis  No acute abnormality.   Original Report Authenticated By: Janeece Riggers, M.D.   Ct Maxillofacial Wo Cm  11/17/2012   *RADIOLOGY REPORT*  Clinical Data:  Left facial swelling.  Double  vision.  CT HEAD WITHOUT CONTRAST CT MAXILLOFACIAL WITHOUT CONTRAST  Technique:  Multidetector CT imaging of the head and maxillofacial structures were performed using the standard protocol without intravenous contrast. Multiplanar CT image reconstructions of the maxillofacial structures were also generated.  Comparison:  None  CT HEAD  Findings: Patchy hypodensity throughout the cerebral white matter bilaterally most consistent with chronic microvascular ischemia. No acute infarct.  Negative for hemorrhage or mass lesion.  Ventricle size is normal.  No midline shift.  No skull lesion.  IMPRESSION: Moderate to advanced chronic microvascular ischemia in the white matter.  No acute abnormality.  CT MAXILLOFACIAL  Findings:   Extensive dental disease with multiple caries and periapical lucency around multiple teeth. Soft tissue swelling over the left maxilla consistent with cellulitis.  No definite abscess. Infection most likely  related to dental infection in the left maxilla.  Negative for fracture.  Chronic sinusitis with mild mucosal edema in the paranasal sinuses. Mastoid sinuses are clear.  Degenerative change in the temporal mandibular joint bilaterally.  Negative for soft tissue mass or edema.  Orbital contents are normal.  The visualized intracranial contents are negative.  IMPRESSION: Extensive dental infection. Cellulitis left maxilla most likely related to dental infection.  Chronic sinusitis  No acute abnormality.   Original Report Authenticated By: Janeece Riggers, M.D.    Microbiology: No results found for this or any previous visit (from the past 240 hour(s)).   Labs: Basic Metabolic Panel:  Recent Labs Lab 11/17/12 1657 11/17/12 2052 11/18/12 0500  NA 139  --  137  K 3.0*  --  3.0*  CL 101  --  100  CO2 25  --  26  GLUCOSE 96  --  107*  BUN 5*  --  5*  CREATININE 0.53 0.51 0.58  CALCIUM 9.3  --  8.8   Liver Function Tests:  Recent Labs Lab 11/17/12 1657  AST 18  ALT 8  ALKPHOS 147*  BILITOT 0.4  PROT 8.9*  ALBUMIN 3.6   No results found for this basename: LIPASE, AMYLASE,  in the last 168 hours No results found for this basename: AMMONIA,  in the last 168 hours CBC:  Recent Labs Lab 11/17/12 1657 11/17/12 2052 11/18/12 0500  WBC 11.4* 11.6* 11.5*  NEUTROABS 9.3*  --   --   HGB 14.2 13.4 12.7  HCT 39.8 38.0 36.0  MCV 85.6 85.6 85.7  PLT 392 343 349   Cardiac Enzymes: No results found for this basename: CKTOTAL, CKMB, CKMBINDEX, TROPONINI,  in the last 168 hours BNP: BNP (last 3 results) No results found for this basename: PROBNP,  in the last 8760 hours CBG: No results found for this basename: GLUCAP,  in the last 168 hours     Signed:  Rhetta Mura  [facility] 11/19/2012, 8:14 AM

## 2012-11-19 NOTE — Progress Notes (Signed)
Called Case Manger for medication assistance and left message

## 2012-11-21 ENCOUNTER — Encounter (HOSPITAL_COMMUNITY): Payer: Self-pay | Admitting: Anesthesiology

## 2012-11-21 ENCOUNTER — Ambulatory Visit (HOSPITAL_COMMUNITY): Admission: RE | Admit: 2012-11-21 | Payer: MEDICAID | Source: Ambulatory Visit | Admitting: Oral Surgery

## 2012-11-21 ENCOUNTER — Encounter (HOSPITAL_COMMUNITY): Admission: RE | Payer: Self-pay | Source: Ambulatory Visit

## 2012-11-21 SURGERY — INCISION AND DRAINAGE, ABSCESS
Anesthesia: General | Laterality: Bilateral

## 2012-11-30 ENCOUNTER — Encounter (HOSPITAL_COMMUNITY): Payer: Self-pay | Admitting: Family Medicine

## 2012-11-30 ENCOUNTER — Emergency Department (HOSPITAL_COMMUNITY)
Admission: EM | Admit: 2012-11-30 | Discharge: 2012-11-30 | Disposition: A | Discharge status: Home / Self Care | Payer: Self-pay | Attending: Emergency Medicine | Admitting: Emergency Medicine

## 2012-11-30 DIAGNOSIS — I10 Essential (primary) hypertension: Secondary | ICD-10-CM | POA: Insufficient documentation

## 2012-11-30 DIAGNOSIS — Z79899 Other long term (current) drug therapy: Secondary | ICD-10-CM | POA: Insufficient documentation

## 2012-11-30 DIAGNOSIS — Z8739 Personal history of other diseases of the musculoskeletal system and connective tissue: Secondary | ICD-10-CM | POA: Insufficient documentation

## 2012-11-30 DIAGNOSIS — F17200 Nicotine dependence, unspecified, uncomplicated: Secondary | ICD-10-CM | POA: Insufficient documentation

## 2012-11-30 DIAGNOSIS — Z87448 Personal history of other diseases of urinary system: Secondary | ICD-10-CM | POA: Insufficient documentation

## 2012-11-30 DIAGNOSIS — Z792 Long term (current) use of antibiotics: Secondary | ICD-10-CM | POA: Insufficient documentation

## 2012-11-30 LAB — BASIC METABOLIC PANEL
BUN: 11 mg/dL (ref 6–23)
Calcium: 9.5 mg/dL (ref 8.4–10.5)
Creatinine, Ser: 0.66 mg/dL (ref 0.50–1.10)
GFR calc Af Amer: >90 mL/min (ref >90)
GFR calc non Af Amer: >90 mL/min (ref >90)

## 2012-11-30 NOTE — ED Notes (Signed)
Per pt sts was at the dentist this am to have surgery and was sent here because her blood pressure was too high. sts was around 200 systolic and then 190. sts she took her lisinopril this am.

## 2012-11-30 NOTE — ED Provider Notes (Signed)
History    CSN: [id number] Arrival date & time 11/30/12  1152  First MD Initiated Contact with Patient 11/30/12 1315     Chief Complaint  Patient presents with  . Hypertension   (Consider location/radiation/quality/duration/timing/severity/associated sxs/prior Treatment) HPI Comments: Patient was seen by the oral surgeon this morning and was found to have high blood pressure, pt reports 201/185.  States oral surgeon sent her to the ED to be checked.  Pt states she was feeling fine and was looking forward to getting her teeth pulled.  Pt was seen in ED and admitted to the hospital 11/17/12- she was discharged on Lisinopril 20mg  daily, which she has been taking.  Denies CP, SOB, HA.  She and family member deny any altered mental status.  Per hospital records, pt was to have BMP rechecked in two weeks.  Pt states she does not have a PCP and has not followed up for her BP or the BMP.   Patient is a 67 y.o. female presenting with hypertension. The history is provided by the patient.  Hypertension Pertinent negatives include no abdominal pain, chest pain, coughing, fever, headaches, nausea, numbness, vomiting or weakness.   Past Medical History  Diagnosis Date  . Hypertension   . Arthritis   . Renal disorder    Past Surgical History  Procedure Laterality Date  . Breast lumpectomy     History reviewed. No pertinent family history. History  Substance Use Topics  . Smoking status: Current Every Day Smoker    Types: Cigarettes  . Smokeless tobacco: Not on file  . Alcohol Use: Yes     Comment: occ   OB History   Grav Para Term Preterm Abortions TAB SAB Ect Mult Living                 Review of Systems  Constitutional: Negative for fever.  Respiratory: Negative for cough and shortness of breath.   Cardiovascular: Negative for chest pain.  Gastrointestinal: Negative for nausea, vomiting, abdominal pain and diarrhea.  Neurological: Negative for weakness, numbness and headaches.     Allergies  Iodinated diagnostic agents; Motrin; and Shrimp  Home Medications   Current Outpatient Rx  Name  Route  Sig  Dispense  Refill  . chlorhexidine (PERIDEX) 0.12 % solution   Mouth/Throat   Use as directed 15 mLs in the mouth or throat 2 (two) times daily.   120 mL   0   . clindamycin (CLEOCIN) 150 MG capsule   Oral   Take 3 capsules (450 mg total) by mouth 3 (three) times daily.   63 capsule   0   . ibuprofen (ADVIL,MOTRIN) 200 MG tablet   Oral   Take 400 mg by mouth every 6 (six) hours as needed for pain.         Marland Kitchen lisinopril (PRINIVIL,ZESTRIL) 20 MG tablet   Oral   Take 1 tablet (20 mg total) by mouth daily.   30 tablet   6    BP 164/89  Pulse 59  Temp(Src) 98 F (36.7 C) (Oral)  Resp 17  SpO2 97% Physical Exam  Nursing note and vitals reviewed. Constitutional: She appears well-developed and well-nourished. No distress.  HENT:  Head: Normocephalic and atraumatic.  Neck: Neck supple.  Cardiovascular: Normal rate and regular rhythm.   Pulmonary/Chest: Effort normal and breath sounds normal. No respiratory distress. She has no wheezes. She has no rales.  Abdominal: Soft. She exhibits no distension. There is no tenderness. There is no rebound  and no guarding.  Neurological: She is alert. GCS eye subscore is 4. GCS verbal subscore is 5. GCS motor subscore is 6.  Skin: She is not diaphoretic.    ED Course  Procedures (including critical care time) Labs Reviewed  BASIC METABOLIC PANEL   No results found.  Filed Vitals:   11/30/12 1500  BP: 154/87  Pulse: 55  Temp:   Resp: 16     1. Hypertension     MDM  Pt with HTN and no PCP, recently started on lisinopril.  No symptoms concerning for HTN emergency.  BMP ordered to check renal function.  Pt strongly advised she needs to control her blood pressure - discussed risks of persistently elevated blood pressure. Pt may have had elevated pressure that was situational - being at the dentist about  to have a procedure done.  Her blood pressure was elevated but not alarmingly so in ED.  Pt given PCP resources for follow up.  Pt given return precautions.  Pt verbalizes understanding and agrees with plan.     Trixie Dredge, PA-C 11/30/12 1541

## 2012-11-30 NOTE — ED Notes (Signed)
Report received, assumed care.  

## 2012-12-04 NOTE — ED Provider Notes (Signed)
Medical screening examination/treatment/procedure(s) were performed by non-physician practitioner and as supervising physician I was immediately available for consultation/collaboration.  Nalee Lightle T Pate Aylward, MD 12/04/12 0836 

## 2012-12-26 ENCOUNTER — Ambulatory Visit: Payer: Self-pay | Admitting: Internal Medicine

## 2012-12-26 ENCOUNTER — Ambulatory Visit: Payer: Medicaid Other | Attending: Family Medicine | Admitting: Internal Medicine

## 2012-12-26 ENCOUNTER — Encounter: Payer: Self-pay | Admitting: Internal Medicine

## 2012-12-26 DIAGNOSIS — I10 Essential (primary) hypertension: Secondary | ICD-10-CM

## 2012-12-26 DIAGNOSIS — M069 Rheumatoid arthritis, unspecified: Secondary | ICD-10-CM

## 2012-12-26 MED ORDER — MELOXICAM 15 MG PO TABS: 15.0000 mg | ORAL_TABLET | Freq: Every day | ORAL | Status: DC

## 2012-12-26 MED ORDER — LISINOPRIL 20 MG PO TABS: 20.0000 mg | ORAL_TABLET | Freq: Every day | ORAL | Status: DC

## 2012-12-26 NOTE — Progress Notes (Unsigned)
Pt here to establish care with orange card HX. Rheumatoid Arthritis Pain management

## 2012-12-26 NOTE — Progress Notes (Signed)
Patient ID: Kristy Oliver, female   DOB: 08/20/1945, 67 y.o.   MRN: [id number]  CC:  HPI:\  67 year old [place] American female who recently moved from [place]. Will has a history of premature arthritis and used to be on hydroxychloroquine and Percocet for her pain who is here for evaluation of her severe irregular arthritis. The patient has multiple rheumatoid nodules on her elbow as well as on her hands. She has multiple deformities of her fingers. She also complains of neck pain she denies any chest pain or shortness of breath. She has a history of hypertension and has run out of her lisinopril. She discontinued her DMARD treatment because she ran out of her Medicaid.    Allergies  Allergen Reactions  . Iodinated Diagnostic Agents     unknown  . Motrin (Ibuprofen)     States she is only allergic to 800 mg formulation  . Shrimp (Shellfish Allergy) Hives, Swelling and Rash   Past Medical History  Diagnosis Date  . Hypertension   . Arthritis   . Renal disorder    Current Outpatient Prescriptions on File Prior to Visit  Medication Sig Dispense Refill  . chlorhexidine (PERIDEX) 0.12 % solution Use as directed 15 mLs in the mouth or throat 2 (two) times daily.  120 mL  0  . clindamycin (CLEOCIN) 150 MG capsule Take 3 capsules (450 mg total) by mouth 3 (three) times daily.  63 capsule  0   No current facility-administered medications on file prior to visit.   No family history on file. History   Social History  . Marital Status: Single    Spouse Name: N/A    Number of Children: N/A  . Years of Education: N/A   Occupational History  . Not on file.   Social History Main Topics  . Smoking status: Current Every Day Smoker    Types: Cigarettes  . Smokeless tobacco: Not on file  . Alcohol Use: Yes     Comment: occ  . Drug Use: No  . Sexually Active: Not on file   Other Topics Concern  . Not on file   Social History Narrative  . No narrative on file    Review  of Systems  Constitutional: Negative for fever, chills, diaphoresis, activity change, appetite change and fatigue.  HENT: Negative for ear pain, nosebleeds, congestion, facial swelling, rhinorrhea, neck pain, neck stiffness and ear discharge.   Eyes: Negative for pain, discharge, redness, itching and visual disturbance.  Respiratory: Negative for cough, choking, chest tightness, shortness of breath, wheezing and stridor.   Cardiovascular: Negative for chest pain, palpitations and leg swelling.  Gastrointestinal: Negative for abdominal distention.  Genitourinary: Negative for dysuria, urgency, frequency, hematuria, flank pain, decreased urine volume, difficulty urinating and dyspareunia.  Musculoskeletal: Negative for back pain, joint swelling, arthralgias and gait problem.  Neurological: Negative for dizziness, tremors, seizures, syncope, facial asymmetry, speech difficulty, weakness, light-headedness, numbness and headaches.  Hematological: Negative for adenopathy. Does not bruise/bleed easily.  Psychiatric/Behavioral: Negative for hallucinations, behavioral problems, confusion, dysphoric mood, decreased concentration and agitation.    Objective:  There were no vitals filed for this visit.  Physical Exam  Constitutional: Appears well-developed and well-nourished. No distress.  HENT: Normocephalic. External right and left ear normal. Oropharynx is clear and moist.  Eyes: Conjunctivae and EOM are normal. PERRLA, no scleral icterus.  Neck: Normal ROM. Neck supple. No JVD. No tracheal deviation. No thyromegaly.  CVS: RRR, S1/S2 +, no murmurs, no gallops, no carotid bruit.  Pulmonary: Effort and breath sounds normal, no stridor, rhonchi, wheezes, rales.  Abdominal: Soft. BS +,  no distension, tenderness, rebound or guarding.  Musculoskeletal: Normal range of motion. No edema and no tenderness.  Lymphadenopathy: No lymphadenopathy noted, cervical, inguinal. Neuro: Alert. Normal reflexes, muscle  tone coordination. No cranial nerve deficit. Skin: Skin is warm and dry. No rash noted. Not diaphoretic. No erythema. No pallor.  Psychiatric: Normal mood and affect. Behavior, judgment, thought content normal.   Lab Results  Component Value Date   WBC 11.5* 11/18/2012   HGB 12.7 11/18/2012   HCT 36.0 11/18/2012   MCV 85.7 11/18/2012   PLT 349 11/18/2012   Lab Results  Component Value Date   CREATININE 0.66 11/30/2012   BUN 11 11/30/2012   NA 137 11/30/2012   K 3.8 11/30/2012   CL 103 11/30/2012   CO2 24 11/30/2012    No results found for this basename: HGBA1C   Lipid Panel  No results found for this basename: chol, trig, hdl, cholhdl, vldl, ldlcalc       Assessment and plan:   Patient Active Problem List   Diagnosis Date Noted  . Facial cellulitis 11/17/2012  . Poor dentition 11/17/2012  . HTN (hypertension), malignant 11/17/2012        Rheumatoid arthritis Check anti-CCP antibody ESR, PPD testing Chest x-ray C-spine x-ray Rheumatology referral Patient come back and have her PPD read Meloxicam for pain Eventually she does need to start dmard  Treatment as quickly as possible Followup in 2 weeks

## 2012-12-27 LAB — COMPREHENSIVE METABOLIC PANEL
Albumin: 4 g/dL (ref 3.5–5.2)
Alkaline Phosphatase: 144 U/L — ABNORMAL HIGH (ref 39–117)
BUN: 13 mg/dL (ref 6–23)
CO2: 25 mEq/L (ref 19–32)
Glucose, Bld: 77 mg/dL (ref 70–99)
Potassium: 4.6 mEq/L (ref 3.5–5.3)
Sodium: 138 mEq/L (ref 135–145)
Total Protein: 8.6 g/dL — ABNORMAL HIGH (ref 6.0–8.3)

## 2012-12-27 LAB — CYCLIC CITRUL PEPTIDE ANTIBODY, IGG: Cyclic Citrullin Peptide Ab: >300 U/mL — ABNORMAL HIGH (ref 0.0–5.0)

## 2012-12-28 ENCOUNTER — Ambulatory Visit: Payer: No Typology Code available for payment source | Attending: Family Medicine

## 2013-01-09 ENCOUNTER — Ambulatory Visit (HOSPITAL_COMMUNITY)
Admission: RE | Admit: 2013-01-09 | Discharge: 2013-01-09 | Disposition: A | Discharge status: Home / Self Care | Payer: No Typology Code available for payment source | Source: Ambulatory Visit | Attending: Internal Medicine | Admitting: Internal Medicine

## 2013-01-09 ENCOUNTER — Ambulatory Visit: Payer: No Typology Code available for payment source | Attending: Internal Medicine

## 2013-01-09 DIAGNOSIS — M069 Rheumatoid arthritis, unspecified: Secondary | ICD-10-CM | POA: Insufficient documentation

## 2013-01-09 DIAGNOSIS — I10 Essential (primary) hypertension: Secondary | ICD-10-CM | POA: Insufficient documentation

## 2013-01-09 DIAGNOSIS — Z96619 Presence of unspecified artificial shoulder joint: Secondary | ICD-10-CM | POA: Insufficient documentation

## 2013-01-09 DIAGNOSIS — M439 Deforming dorsopathy, unspecified: Secondary | ICD-10-CM | POA: Insufficient documentation

## 2013-01-09 DIAGNOSIS — M542 Cervicalgia: Secondary | ICD-10-CM | POA: Insufficient documentation

## 2013-03-09 DIAGNOSIS — M109 Gout, unspecified: Secondary | ICD-10-CM | POA: Insufficient documentation

## 2013-06-30 ENCOUNTER — Emergency Department (HOSPITAL_COMMUNITY): Payer: Medicare Other

## 2013-06-30 ENCOUNTER — Encounter (HOSPITAL_COMMUNITY): Payer: Self-pay | Admitting: Emergency Medicine

## 2013-06-30 ENCOUNTER — Inpatient Hospital Stay (HOSPITAL_COMMUNITY)
Admission: EM | Admit: 2013-06-30 | Discharge: 2013-07-03 | DRG: 682 | Disposition: A | Discharge status: Home / Self Care | Payer: Medicare Other | Attending: Internal Medicine | Admitting: Internal Medicine

## 2013-06-30 DIAGNOSIS — M069 Rheumatoid arthritis, unspecified: Secondary | ICD-10-CM | POA: Diagnosis present

## 2013-06-30 DIAGNOSIS — K089 Disorder of teeth and supporting structures, unspecified: Secondary | ICD-10-CM

## 2013-06-30 DIAGNOSIS — R109 Unspecified abdominal pain: Secondary | ICD-10-CM | POA: Diagnosis not present

## 2013-06-30 DIAGNOSIS — R930 Abnormal findings on diagnostic imaging of skull and head, not elsewhere classified: Secondary | ICD-10-CM | POA: Diagnosis not present

## 2013-06-30 DIAGNOSIS — N179 Acute kidney failure, unspecified: Principal | ICD-10-CM

## 2013-06-30 DIAGNOSIS — F1210 Cannabis abuse, uncomplicated: Secondary | ICD-10-CM | POA: Diagnosis present

## 2013-06-30 DIAGNOSIS — I10 Essential (primary) hypertension: Secondary | ICD-10-CM

## 2013-06-30 DIAGNOSIS — D259 Leiomyoma of uterus, unspecified: Secondary | ICD-10-CM | POA: Diagnosis present

## 2013-06-30 DIAGNOSIS — R197 Diarrhea, unspecified: Secondary | ICD-10-CM

## 2013-06-30 DIAGNOSIS — R220 Localized swelling, mass and lump, head: Secondary | ICD-10-CM | POA: Diagnosis present

## 2013-06-30 DIAGNOSIS — M1A00X1 Idiopathic chronic gout, unspecified site, with tophus (tophi): Secondary | ICD-10-CM | POA: Diagnosis present

## 2013-06-30 DIAGNOSIS — R5381 Other malaise: Secondary | ICD-10-CM | POA: Diagnosis present

## 2013-06-30 DIAGNOSIS — K529 Noninfective gastroenteritis and colitis, unspecified: Secondary | ICD-10-CM | POA: Diagnosis present

## 2013-06-30 DIAGNOSIS — E860 Dehydration: Secondary | ICD-10-CM | POA: Diagnosis not present

## 2013-06-30 DIAGNOSIS — E41 Nutritional marasmus: Secondary | ICD-10-CM | POA: Diagnosis present

## 2013-06-30 DIAGNOSIS — R221 Localized swelling, mass and lump, neck: Secondary | ICD-10-CM

## 2013-06-30 DIAGNOSIS — F17200 Nicotine dependence, unspecified, uncomplicated: Secondary | ICD-10-CM | POA: Diagnosis present

## 2013-06-30 DIAGNOSIS — A0472 Enterocolitis due to Clostridium difficile, not specified as recurrent: Secondary | ICD-10-CM | POA: Diagnosis present

## 2013-06-30 DIAGNOSIS — L03211 Cellulitis of face: Secondary | ICD-10-CM

## 2013-06-30 DIAGNOSIS — E43 Unspecified severe protein-calorie malnutrition: Secondary | ICD-10-CM | POA: Insufficient documentation

## 2013-06-30 DIAGNOSIS — Z720 Tobacco use: Secondary | ICD-10-CM

## 2013-06-30 DIAGNOSIS — Z91013 Allergy to seafood: Secondary | ICD-10-CM | POA: Diagnosis not present

## 2013-06-30 HISTORY — DX: Gout, unspecified: M10.9

## 2013-06-30 LAB — URINALYSIS, ROUTINE W REFLEX MICROSCOPIC
Glucose, UA: NEGATIVE mg/dL
Ketones, ur: 15 mg/dL — AB
Leukocytes, UA: NEGATIVE
NITRITE: NEGATIVE
Protein, ur: NEGATIVE mg/dL
SPECIFIC GRAVITY, URINE: 1.009 (ref 1.005–1.030)
UROBILINOGEN UA: 0.2 mg/dL (ref 0.0–1.0)
pH: 5 (ref 5.0–8.0)

## 2013-06-30 LAB — CBC WITH DIFFERENTIAL/PLATELET
Basophils Absolute: 0.1 10*3/uL (ref 0.0–0.1)
Basophils Relative: 1 % (ref 0–1)
Eosinophils Absolute: 0.1 10*3/uL (ref 0.0–0.7)
Eosinophils Relative: 1 % (ref 0–5)
HCT: 42.9 % (ref 36.0–46.0)
Hemoglobin: 15.4 g/dL — ABNORMAL HIGH (ref 12.0–15.0)
LYMPHS ABS: 1.6 10*3/uL (ref 0.7–4.0)
LYMPHS PCT: 19 % (ref 12–46)
MCH: 31.6 pg (ref 26.0–34.0)
MCHC: 35.9 g/dL (ref 30.0–36.0)
MCV: 88.1 fL (ref 78.0–100.0)
Monocytes Absolute: 0.4 10*3/uL (ref 0.1–1.0)
Monocytes Relative: 5 % (ref 3–12)
Neutro Abs: 6.2 10*3/uL (ref 1.7–7.7)
Neutrophils Relative %: 75 % (ref 43–77)
Platelets: 226 10*3/uL (ref 150–400)
RBC: 4.87 MIL/uL (ref 3.87–5.11)
RDW: 14 % (ref 11.5–15.5)
WBC: 8.3 10*3/uL (ref 4.0–10.5)

## 2013-06-30 LAB — COMPREHENSIVE METABOLIC PANEL
ALK PHOS: 161 U/L — AB (ref 39–117)
ALT: 22 U/L (ref 0–35)
AST: 36 U/L (ref 0–37)
Albumin: 3.6 g/dL (ref 3.5–5.2)
BUN: 65 mg/dL — AB (ref 6–23)
CALCIUM: 9.6 mg/dL (ref 8.4–10.5)
CO2: 13 meq/L — AB (ref 19–32)
Chloride: 98 mEq/L (ref 96–112)
Creatinine, Ser: 1.88 mg/dL — ABNORMAL HIGH (ref 0.50–1.10)
GFR calc Af Amer: 31 mL/min — ABNORMAL LOW (ref >90)
GFR, EST NON AFRICAN AMERICAN: 27 mL/min — AB (ref >90)
GLUCOSE: 69 mg/dL — AB (ref 70–99)
Potassium: 5.3 mEq/L (ref 3.7–5.3)
Sodium: 135 mEq/L — ABNORMAL LOW (ref 137–147)
Total Bilirubin: 0.3 mg/dL (ref 0.3–1.2)
Total Protein: 8.6 g/dL — ABNORMAL HIGH (ref 6.0–8.3)

## 2013-06-30 LAB — URINE MICROSCOPIC-ADD ON

## 2013-06-30 LAB — OCCULT BLOOD, POC DEVICE: Fecal Occult Bld: NEGATIVE

## 2013-06-30 LAB — RAPID URINE DRUG SCREEN, HOSP PERFORMED
Amphetamines: NOT DETECTED
Barbiturates: NOT DETECTED
Benzodiazepines: NOT DETECTED
COCAINE: NOT DETECTED
Opiates: POSITIVE — AB
Tetrahydrocannabinol: POSITIVE — AB

## 2013-06-30 LAB — ETHANOL

## 2013-06-30 LAB — LIPASE, BLOOD: LIPASE: 25 U/L (ref 11–59)

## 2013-06-30 LAB — CG4 I-STAT (LACTIC ACID): Lactic Acid, Venous: 1.05 mmol/L (ref 0.5–2.2)

## 2013-06-30 MED ORDER — DIPHENHYDRAMINE HCL 25 MG PO CAPS
25.0000 mg | ORAL_CAPSULE | Freq: Once | ORAL | Status: AC
  Administered 2013-06-30: 25 mg via ORAL
  Filled 2013-06-30: qty 1

## 2013-06-30 MED ORDER — ACETAMINOPHEN 650 MG RE SUPP: 650.0000 mg | Freq: Four times a day (QID) | RECTAL | Status: DC | PRN

## 2013-06-30 MED ORDER — ONDANSETRON HCL 4 MG PO TABS: 4.0000 mg | ORAL_TABLET | Freq: Four times a day (QID) | ORAL | Status: DC | PRN

## 2013-06-30 MED ORDER — SODIUM CHLORIDE 0.9 % IV BOLUS (SEPSIS)
1000.0000 mL | Freq: Once | INTRAVENOUS | Status: AC
  Administered 2013-06-30: 1000 mL via INTRAVENOUS

## 2013-06-30 MED ORDER — HYDROCODONE-ACETAMINOPHEN 5-325 MG PO TABS
1.0000 | ORAL_TABLET | ORAL | Status: DC | PRN
  Administered 2013-07-02: 1 via ORAL
  Filled 2013-06-30: qty 1

## 2013-06-30 MED ORDER — ONDANSETRON HCL 4 MG/2ML IJ SOLN: 4.0000 mg | Freq: Four times a day (QID) | INTRAMUSCULAR | Status: DC | PRN

## 2013-06-30 MED ORDER — ACETAMINOPHEN 325 MG PO TABS: 650.0000 mg | ORAL_TABLET | Freq: Four times a day (QID) | ORAL | Status: DC | PRN

## 2013-06-30 MED ORDER — SODIUM CHLORIDE 0.9 % IV SOLN
INTRAVENOUS | Status: DC
  Administered 2013-06-30 – 2013-07-01 (×3): via INTRAVENOUS
  Administered 2013-07-02: 100 mL/h via INTRAVENOUS

## 2013-06-30 MED ORDER — PNEUMOCOCCAL VAC POLYVALENT 25 MCG/0.5ML IJ INJ
0.5000 mL | INJECTION | INTRAMUSCULAR | Status: AC
  Administered 2013-07-01: 0.5 mL via INTRAMUSCULAR
  Filled 2013-06-30: qty 0.5

## 2013-06-30 MED ORDER — MORPHINE SULFATE 4 MG/ML IJ SOLN
4.0000 mg | Freq: Once | INTRAMUSCULAR | Status: AC
  Administered 2013-06-30: 4 mg via INTRAVENOUS
  Filled 2013-06-30: qty 1

## 2013-06-30 MED ORDER — HEPARIN SODIUM (PORCINE) 5000 UNIT/ML IJ SOLN
5000.0000 [IU] | Freq: Three times a day (TID) | INTRAMUSCULAR | Status: DC
  Administered 2013-06-30 – 2013-07-03 (×9): 5000 [IU] via SUBCUTANEOUS
  Filled 2013-06-30 (×11): qty 1

## 2013-06-30 NOTE — ED Notes (Signed)
Three knots noted to the back of pt head, pt reports h/o gout and has been taking a new medicine for it x3 months.

## 2013-06-30 NOTE — ED Provider Notes (Signed)
CSN: [id number]     Arrival date & time 06/30/13  1119 History   First MD Initiated Contact with Patient 06/30/13 1129     Chief Complaint  Patient presents with  . knots on head   . Diarrhea   (Consider location/radiation/quality/duration/timing/severity/associated sxs/prior Treatment) HPI  68 year old female with history of hypertension, arthritis, and renal disorder presents with multiple complaints. History obtained through patient and through grandson who is at bedside. Her grandson, patient finally had establish Medicaid and now she is here for evaluations of abdominal pain, and knots in the back of her head. Patient is a alcohol abuser, she also has history of tophaceous gout she takes meloxicam. For the past one to 2 weeks she has been having recurrent mid abdominal pain which she described as a twisting sensation usually worsened after eating. She also report having multiple bouts of nonbloody non-mucousy diarrhea quantify as 3 or 4 times a day. States she has no appetite but denies nausea or vomiting. No complaints of fever, chills, chest pain, shortness of breath, productive cough. No nausea, no vomiting, no hematemesis, no hematochezia or melena. No recent antibiotic use. No abnormal weight changes. Last alcohol intake was 3 days ago but she usually drinks several beers daily. She does consume marijuana on occasion. Denies any history of cancer.  She noticed a knot in the back of her head 2 days ago. States is tender to palpation, denies any recent trauma. Denies any numbness, weakness, lightheadedness, dizziness, neck pain, or stiffness  Past Medical History  Diagnosis Date  . Hypertension   . Arthritis   . Renal disorder    Past Surgical History  Procedure Laterality Date  . Breast lumpectomy     No family history on file. History  Substance Use Topics  . Smoking status: Current Every Day Smoker    Types: Cigarettes  . Smokeless tobacco: Not on file  . Alcohol Use: Yes    Comment: occ   OB History   Grav Para Term Preterm Abortions TAB SAB Ect Mult Living                 Review of Systems  All other systems reviewed and are negative.    Allergies  Iodinated diagnostic agents; Motrin; and Shrimp  Home Medications   Current Outpatient Rx  Name  Route  Sig  Dispense  Refill  . chlorhexidine (PERIDEX) 0.12 % solution   Mouth/Throat   Use as directed 15 mLs in the mouth or throat 2 (two) times daily.   120 mL   0   . clindamycin (CLEOCIN) 150 MG capsule   Oral   Take 3 capsules (450 mg total) by mouth 3 (three) times daily.   63 capsule   0   . lisinopril (PRINIVIL,ZESTRIL) 20 MG tablet   Oral   Take 1 tablet (20 mg total) by mouth daily.   30 tablet   6   . meloxicam (MOBIC) 15 MG tablet   Oral   Take 1 tablet (15 mg total) by mouth daily.   60 tablet   3    BP 129/78  Pulse 95  Temp(Src) 97.5 F (36.4 C) (Oral)  Resp 18  Ht 5' 2"  (1.575 m)  Wt 145 lb (65.772 kg)  BMI 26.51 kg/m2  SpO2 100% Physical Exam  Nursing note and vitals reviewed. Constitutional: She is oriented to person, place, and time. She appears well-developed and well-nourished. No distress.  HENT:  Head: Atraumatic.  Mouth/Throat: Oropharynx is  clear and moist.  Eyes: Conjunctivae are normal.  Neck: Neck supple.  Cardiovascular: Normal rate and regular rhythm.   Pulmonary/Chest: Effort normal and breath sounds normal. No respiratory distress. She has no wheezes. She has no rales. She exhibits no tenderness.  Abdominal: Soft. Bowel sounds are normal. She exhibits no distension. There is no tenderness. There is no rebound and no guarding.  Neurological: She is alert and oriented to person, place, and time.  Skin: No rash noted.  Head: occipital region with 3 slightly mobile subcutaneous nodules measuring 1cm each, mildly tender without fluctuance or overlying skin changes.  No allopecia  Tophaceous gout nodules noted to R elbow, overlying joints of fingers  from both hands.    Psychiatric: She has a normal mood and affect.    ED Course  Procedures (including critical care time)  12:19 PM Patient with history of alcohol abuse here with persistent abdominal pain and diarrhea. She has a nonsurgical abdomen on exam she is afebrile with stable normal vital signs. Workup initiated  She also has several nodules to the occipital region of her scalp I suspect that this is related to tophaceous gout however she was not forthcoming and eventual possible traumas, will obtain head CT to rule out occult fracture. I do not suspect infectious etiology such as an abscess.  1:41 PM Pt has evidence of acute renal failure with BUN 65, Cr 1.88 and an elevated alk phos of 161.  i suspect acute renal failure 2/2 dehydration and NSAIDs use.  Pt report decreased in urination for the past 2 weeks.  Since pt is allergic to contrast dye and has decreased renal function, will obtain non contrast abd/pelvis CT.  Will consult for admission.  Care discussed with Dr. Reather Converse.    5:26 PM I have consulted with [facility], Dr. Carles Collet who agrees to see pt in ER and will admit for further care.    Labs Review Labs Reviewed  CBC WITH DIFFERENTIAL - Abnormal; Notable for the following:    Hemoglobin 15.4 (*)    All other components within normal limits  COMPREHENSIVE METABOLIC PANEL - Abnormal; Notable for the following:    Sodium 135 (*)    CO2 13 (*)    Glucose, Bld 69 (*)    BUN 65 (*)    Creatinine, Ser 1.88 (*)    Total Protein 8.6 (*)    Alkaline Phosphatase 161 (*)    GFR calc non Af Amer 27 (*)    GFR calc Af Amer 31 (*)    All other components within normal limits  URINALYSIS, ROUTINE W REFLEX MICROSCOPIC - Abnormal; Notable for the following:    Hgb urine dipstick TRACE (*)    Bilirubin Urine SMALL (*)    Ketones, ur 15 (*)    All other components within normal limits  URINE RAPID DRUG SCREEN (HOSP PERFORMED) - Abnormal; Notable for the following:     Opiates POSITIVE (*)    Tetrahydrocannabinol POSITIVE (*)    All other components within normal limits  LIPASE, BLOOD  ETHANOL  URINE MICROSCOPIC-ADD ON  CG4 I-STAT (LACTIC ACID)  OCCULT BLOOD, POC DEVICE   Imaging Review Ct Abdomen Pelvis Wo Contrast  06/30/2013   CLINICAL DATA:  Diarrhea  EXAM: CT ABDOMEN AND PELVIS WITHOUT CONTRAST  TECHNIQUE: Multidetector CT imaging of the abdomen and pelvis was performed following the standard protocol without intravenous contrast.  COMPARISON:  None.  FINDINGS: Lung bases are free of acute infiltrate or sizable effusion.  The liver, gallbladder, spleen, adrenal glands and pancreas are within normal limits. Evaluation is somewhat limited due to lack of IV contrast. The kidneys are well visualized without renal calculi or obstructive changes. No definitive mass lesion is seen. The appendix is well visualized and within normal limits. No periappendiceal inflammatory changes are noted. The bladder is well distended. Some calcified uterine fibroids are noted. An IUD is seen within the pelvis but appears to lie extrinsic to the uterine body. No free pelvic fluid is seen.  IMPRESSION: No acute abnormality is identified.  An IUD is seen within the pelvis but appears to lie extrinsic to uterus. This is likely been present for quite sometime. Clinical correlation is recommended.   Electronically Signed   By: Inez Catalina M.D.   On: 06/30/2013 16:42   Ct Head Wo Contrast  06/30/2013   CLINICAL DATA:  Palpable abnormality in the occipital region.  EXAM: CT HEAD WITHOUT CONTRAST  TECHNIQUE: Contiguous axial images were obtained from the base of the skull through the vertex without intravenous contrast.  COMPARISON:  None.  FINDINGS: Calvarium is intact. There is no osseous lesion in the occipital region. There is some mild increased soft tissue attenuation over the occipital scalp (image number 15), that may represent a subcutaneous lesion.  No mass lesion, mass effect,  midline shift, hydrocephalus, hemorrhage. No acute territorial cortical ischemia/infarct. Atrophy and chronic ischemic white matter disease is present. Atrophy is age appropriate and mild. Intracranial atherosclerosis is incidentally noted.  IMPRESSION: 1. Chronic ischemic white matter disease without acute intracranial abnormality. 2. No bony abnormality in the occipital region. Very mild subcutaneous infiltration near the occiput is nonspecific on CT and may represent subcutaneous lesions.   Electronically Signed   By: Dereck Ligas M.D.   On: 06/30/2013 13:00    EKG Interpretation   None       MDM   1. Acute renal failure (ARF)   2. Dehydration   3. Diarrhea   4. Abdominal cramping    BP 101/79  Pulse 67  Temp(Src) 97.5 F (36.4 C) (Oral)  Resp 16  Ht 5' 2"  (1.575 m)  Wt 145 lb (65.772 kg)  BMI 26.51 kg/m2  SpO2 100%  I have reviewed nursing notes and vital signs. I personally reviewed the imaging tests through PACS system  I reviewed available ER/hospitalization records thought the EMR     Domenic Moras, [place] 06/30/13 1727

## 2013-06-30 NOTE — Progress Notes (Signed)
Patient had 2 grandsons visiting.  One of the grandsons is Estate agent.  Patient did not want to set up a password to release health information at the grandson's insistent.  She asked to think about it and give me an answer in the morning.  Decario Sirianni became very irate and upset.  He left the room.  Patient lives with this grandson.  I asked her if she felt safe in her current living environment and she stated yes.  She states he would just "run his mouth".  She does admit to drinking 1 to 2 beers a day.  The last one was 3 prior to admission.  She also states that she occasionally does marijuana.  She has fallen in the last 6 months and her hands and legs are week.  She only has a walker at home.  She has her prescription medications from home, glasses, clothes, wallet, and cell phone at beside.  She does not want them to be put in the safe and she does not want the prescriptions to be taken to pharmacy.  I advised that she should not take any medications from home and that we are not responsible for valuables.  She verbalized understanding.  I also stated that she is considered a high fall risk.  This RN reviewed the safety plan with her.  She signed the safety plan and was in agreement with the bed alarm and to call for help prior to getting out of bed.  Will continue to monitor patient.  Rayon Mcchristian RN, [facility]

## 2013-06-30 NOTE — Progress Notes (Signed)
Patient requested sleep medication. MD notified.

## 2013-06-30 NOTE — ED Notes (Signed)
Called CT told them pt was finished drinking contrast

## 2013-06-30 NOTE — ED Notes (Signed)
Pt transported to CT scan.

## 2013-06-30 NOTE — ED Provider Notes (Signed)
Medical screening examination/treatment/procedure(s) were conducted as a shared visit with non-physician practitioner(s) or resident and myself. I personally evaluated the patient during the encounter and agree with the findings and plan unless otherwise indicated.  I have personally reviewed any xrays and/ or EKG's with the provider and I agree with interpretation.  Worsening diarrhea with bilateral lower abdo cramping/ pain intermittent or 10 days. No recent travel or abx.  No sick contacts. Pt is on nsaids for gout. Clinically dry mm, mild tachycardia, mild lower abd pain non focal, no guarding, normal respiration, general weakness. Fluids, labs. CT with po contrast only. Concern for colitis/ enteritis. Lactate added. CT no acute findings. Ct Abdomen Pelvis Wo Contrast  06/30/2013   CLINICAL DATA:  Diarrhea  EXAM: CT ABDOMEN AND PELVIS WITHOUT CONTRAST  TECHNIQUE: Multidetector CT imaging of the abdomen and pelvis was performed following the standard protocol without intravenous contrast.  COMPARISON:  None.  FINDINGS: Lung bases are free of acute infiltrate or sizable effusion.  The liver, gallbladder, spleen, adrenal glands and pancreas are within normal limits. Evaluation is somewhat limited due to lack of IV contrast. The kidneys are well visualized without renal calculi or obstructive changes. No definitive mass lesion is seen. The appendix is well visualized and within normal limits. No periappendiceal inflammatory changes are noted. The bladder is well distended. Some calcified uterine fibroids are noted. An IUD is seen within the pelvis but appears to lie extrinsic to the uterine body. No free pelvic fluid is seen.  IMPRESSION: No acute abnormality is identified.  An IUD is seen within the pelvis but appears to lie extrinsic to uterus. This is likely been present for quite sometime. Clinical correlation is recommended.   Electronically Signed   By: Inez Catalina M.D.   On: 06/30/2013 16:42   Ct Head  Wo Contrast  06/30/2013   CLINICAL DATA:  Palpable abnormality in the occipital region.  EXAM: CT HEAD WITHOUT CONTRAST  TECHNIQUE: Contiguous axial images were obtained from the base of the skull through the vertex without intravenous contrast.  COMPARISON:  None.  FINDINGS: Calvarium is intact. There is no osseous lesion in the occipital region. There is some mild increased soft tissue attenuation over the occipital scalp (image number 15), that may represent a subcutaneous lesion.  No mass lesion, mass effect, midline shift, hydrocephalus, hemorrhage. No acute territorial cortical ischemia/infarct. Atrophy and chronic ischemic white matter disease is present. Atrophy is age appropriate and mild. Intracranial atherosclerosis is incidentally noted.  IMPRESSION: 1. Chronic ischemic white matter disease without acute intracranial abnormality. 2. No bony abnormality in the occipital region. Very mild subcutaneous infiltration near the occiput is nonspecific on CT and may represent subcutaneous lesions.   Electronically Signed   By: Dereck Ligas M.D.   On: 06/30/2013 13:00    ARF, Dehydration, Diarrhea, Abdominal pain   Mariea Clonts, MD 06/30/13 1919

## 2013-06-30 NOTE — ED Notes (Signed)
PA at bedside.

## 2013-06-30 NOTE — ED Notes (Signed)
Patient states "i have 3 knots on the back of my head".   Patient states started having problems with diarrhea and unable to eat x 3 days ago.

## 2013-06-30 NOTE — H&P (Signed)
[facility] History and Physical  Kristy Oliver [name] DOB: [birth year] DOA: 06/30/2013   PCP: Kristy Chessman, MD   Chief Complaint: Diarrhea, general weakness  HPI:  68 year old female with a history of rheumatoid arthritis, hypertension, gouty arthritis presents with 1-1/2 week history of loose stools. Approximately 4-5 days ago, the patient began having lower abdominal cramping and pain. There was no hematochezia or melena. She denied any fevers but complains of some chills. She denied any chest discomfort, shortness breath, coughing, hemoptysis, nausea, vomiting, dysuria, hematuria, new rashes. The patient states that she has had increasing generalized weakness with decreased oral intake over the past 4-5 days. The patient denies any recent travels, eating raw or undercooked foods, or sick contacts. There been no new medications. She denies any recent antibiotics. Of note, the patient was noted to have +CCP antibody on 12/26/12. She states that she went to see a physician at [facility] around September 2014. The patient was placed on culture seen 0.6 mg twice a day which she has been taking regularly. In addition, the patient has been taking Aleve twice a day for the past 1-1/2 months for her joint pains. She continues to smoke approximately 1/4 pack per day of cigarettes. However, she has smoked up to one pack per day x50 years. She has also been using marijuana for at least the last 15 years. She denies any alcohol or other illegal drugs.  In ED, the patient was noted to have a serum creatinine of 1.8, potassium 5.3, hemoglobin 15.4. Urinalysis was negative for pyuria. CT abdomen and pelvis was negative for any acute abnormalities. CT of the brain did not show any acute intracranial changes, but did show some soft tissue attenuation in the occipital scalp of what appeared to be some subQ nodules. The patient was given 2 L normal saline in the  ED. Assessment/Plan: AKI -Secondary to volume depletion compounded with lisinopril and cochicine and NSAID (aleve and mobic) use -Continue IV fluids -Renal ultrasound no improvement -Discontinue lisinopril Diarrhea -Stool pathogen panel -Seated PCR -FOBT neg in ED -CT abdomen pelvis negative for any acute intra-abdominal pathology Rheumatoid arthritis -Patient was previously on Plaquenil, but could no longer afford it after she lost her Medicaid -She follows up with what appears to be a rheumatologist at [facility] -She is currently not on DMARD tx Tophaceous gout -Does not appear to be having an acute flareup -Discontinue cochicine Scalp subcutaneous nodules -Do not appear to be inflamed on exam -Followup as an outpatient -??related to RA Polysubstance abuse -Including tobacco and THC -counseled to quit    Past Medical History  Diagnosis Date  . Hypertension   . Arthritis   . Renal disorder    Past Surgical History  Procedure Laterality Date  . Breast lumpectomy     Social History:  reports that she has been smoking Cigarettes.  She has been smoking about 0.00 packs per day. She does not have any smokeless tobacco history on file. She reports that she drinks alcohol. She reports that she does not use illicit drugs.   Family history reviewed: No pertinent family history  Allergies  Allergen Reactions  . Iodinated Diagnostic Agents     unknown  . Motrin [Ibuprofen]     States she is only allergic to 800 mg formulation  . Shrimp [Shellfish Allergy] Hives, Swelling and Rash      Prior to Admission medications   Medication Sig Start Date End Date Taking? Authorizing Provider  lisinopril (PRINIVIL,ZESTRIL) 20 MG tablet  Take 1 tablet (20 mg total) by mouth daily. 12/26/12  Yes Kristy Dumas, MD  meloxicam (MOBIC) 15 MG tablet Take 1 tablet (15 mg total) by mouth daily. 12/26/12  Yes Kristy Dumas, MD    Review of Systems:  Constitutional:  No weight loss, night sweats,  Fevers, Head&Eyes: No headache.  No vision loss.  No eye pain or scotoma ENT:  No Difficulty swallowing,Sore throat,  No ear ache, post nasal drip,  Cardio-vascular:  No chest pain, Orthopnea, PND, swelling in lower extremities,   Palpitations;  +Dizziness GI:  No  abdominal pain, nausea, vomiting,  loss of appetite, hematochezia, melena, heartburn, indigestion, Resp:  No shortness of breath with exertion or at rest. No cough. No coughing up of blood .No wheezing.No chest wall deformity  Skin:  no rash or lesions.  GU:  no dysuria, change in color of urine, no urgency or frequency. No flank pain.  Musculoskeletal:  Complains of bilateral hand pain. Psych:   No change in mood or affect. No depression or anxiety. Neurologic: No headache, no dysesthesia, no focal weakness, no vision loss. No syncope  Physical Exam: Filed Vitals:   06/30/13 1500 06/30/13 1608 06/30/13 1726 06/30/13 1740  BP: 137/85 101/79 105/71 122/72  Pulse: 91 67 69 71  Temp:      TempSrc:      Resp: 16   16  Height:      Weight:      SpO2: 100% 100% 100% 100%   General:  A&O x 3, NAD, nontoxic, pleasant/cooperative Head/Eye: No conjunctival hemorrhage, no icterus, [place]/AT, No nystagmus ENT:  No icterus,  No thrush, poor dentition, no pharyngeal exudate Neck:  No masses, no lymphadenpathy, no bruits CV:  RRR, no rub, no gallop, no S3 Lung:  CTAB, good air movement, no wheeze, no rhonchi Abdomen: soft/NT, +BS, nondistended, no peritoneal signs Ext: No cyanosis, No rashes, No petechiae, No lymphangitis, No edema; scattered rheumatoid nodules on her bilateral hands and tophi on her bilateral elbows   Labs on Admission:  Basic Metabolic Panel:  Recent Labs Lab 06/30/13 1210  NA 135*  K 5.3  CL 98  CO2 13*  GLUCOSE 69*  BUN 65*  CREATININE 1.88*  CALCIUM 9.6   Liver Function Tests:  Recent Labs Lab 06/30/13 1210  AST 36  ALT 22  ALKPHOS 161*  BILITOT 0.3  PROT 8.6*  ALBUMIN 3.6     Recent Labs Lab 06/30/13 1210  LIPASE 25   No results found for this basename: AMMONIA,  in the last 168 hours CBC:  Recent Labs Lab 06/30/13 1210  WBC 8.3  NEUTROABS 6.2  HGB 15.4*  HCT 42.9  MCV 88.1  PLT 226   Cardiac Enzymes: No results found for this basename: CKTOTAL, CKMB, CKMBINDEX, TROPONINI,  in the last 168 hours BNP: No components found with this basename: POCBNP,  CBG: No results found for this basename: GLUCAP,  in the last 168 hours  Radiological Exams on Admission: Ct Abdomen Pelvis Wo Contrast  06/30/2013   CLINICAL DATA:  Diarrhea  EXAM: CT ABDOMEN AND PELVIS WITHOUT CONTRAST  TECHNIQUE: Multidetector CT imaging of the abdomen and pelvis was performed following the standard protocol without intravenous contrast.  COMPARISON:  None.  FINDINGS: Lung bases are free of acute infiltrate or sizable effusion.  The liver, gallbladder, spleen, adrenal glands and pancreas are within normal limits. Evaluation is somewhat limited due to lack of IV contrast. The kidneys are well visualized without renal calculi or  obstructive changes. No definitive mass lesion is seen. The appendix is well visualized and within normal limits. No periappendiceal inflammatory changes are noted. The bladder is well distended. Some calcified uterine fibroids are noted. An IUD is seen within the pelvis but appears to lie extrinsic to the uterine body. No free pelvic fluid is seen.  IMPRESSION: No acute abnormality is identified.  An IUD is seen within the pelvis but appears to lie extrinsic to uterus. This is likely been present for quite sometime. Clinical correlation is recommended.   Electronically Signed   By: Inez Catalina M.D.   On: 06/30/2013 16:42   Ct Head Wo Contrast  06/30/2013   CLINICAL DATA:  Palpable abnormality in the occipital region.  EXAM: CT HEAD WITHOUT CONTRAST  TECHNIQUE: Contiguous axial images were obtained from the base of the skull through the vertex without intravenous  contrast.  COMPARISON:  None.  FINDINGS: Calvarium is intact. There is no osseous lesion in the occipital region. There is some mild increased soft tissue attenuation over the occipital scalp (image number 15), that may represent a subcutaneous lesion.  No mass lesion, mass effect, midline shift, hydrocephalus, hemorrhage. No acute territorial cortical ischemia/infarct. Atrophy and chronic ischemic white matter disease is present. Atrophy is age appropriate and mild. Intracranial atherosclerosis is incidentally noted.  IMPRESSION: 1. Chronic ischemic white matter disease without acute intracranial abnormality. 2. No bony abnormality in the occipital region. Very mild subcutaneous infiltration near the occiput is nonspecific on CT and may represent subcutaneous lesions.   Electronically Signed   By: Dereck Ligas M.D.   On: 06/30/2013 13:00        Time spent:70 minutes Code Status:   FULL Family Communication:   No Family at bedside   Jonah Nestle, DO  [facility] Pager [phone]  If 7PM-7AM, please contact night-coverage [email or web address] Password [facility] 06/30/2013, 5:47 PM

## 2013-06-30 NOTE — ED Notes (Addendum)
Called dietary tried ordering tray for patient stated we could not place order due to pt being moved in the hospital. Dietary services told me to have unit nurse place the order once pt was on the unit.

## 2013-06-30 NOTE — ED Notes (Signed)
Asked pt to provide a urine specimen pt stated she could not go at this time.

## 2013-06-30 NOTE — ED Notes (Signed)
Patient completed oral contrast.

## 2013-07-01 DIAGNOSIS — I10 Essential (primary) hypertension: Secondary | ICD-10-CM | POA: Diagnosis not present

## 2013-07-01 DIAGNOSIS — M069 Rheumatoid arthritis, unspecified: Secondary | ICD-10-CM

## 2013-07-01 DIAGNOSIS — E43 Unspecified severe protein-calorie malnutrition: Secondary | ICD-10-CM | POA: Insufficient documentation

## 2013-07-01 DIAGNOSIS — N179 Acute kidney failure, unspecified: Secondary | ICD-10-CM | POA: Diagnosis not present

## 2013-07-01 DIAGNOSIS — E860 Dehydration: Secondary | ICD-10-CM | POA: Diagnosis not present

## 2013-07-01 LAB — BASIC METABOLIC PANEL
BUN: 39 mg/dL — ABNORMAL HIGH (ref 6–23)
CALCIUM: 8.6 mg/dL (ref 8.4–10.5)
CO2: 14 mEq/L — ABNORMAL LOW (ref 19–32)
Chloride: 108 mEq/L (ref 96–112)
Creatinine, Ser: 0.99 mg/dL (ref 0.50–1.10)
GFR, EST AFRICAN AMERICAN: 68 mL/min — AB (ref >90)
GFR, EST NON AFRICAN AMERICAN: 59 mL/min — AB (ref >90)
Glucose, Bld: 64 mg/dL — ABNORMAL LOW (ref 70–99)
POTASSIUM: 5.1 meq/L (ref 3.7–5.3)
Sodium: 138 mEq/L (ref 137–147)

## 2013-07-01 LAB — CLOSTRIDIUM DIFFICILE BY PCR: Toxigenic C. Difficile by PCR: POSITIVE — AB

## 2013-07-01 LAB — CBC
HCT: 36.5 % (ref 36.0–46.0)
HEMOGLOBIN: 12.9 g/dL (ref 12.0–15.0)
MCH: 31 pg (ref 26.0–34.0)
MCHC: 35.3 g/dL (ref 30.0–36.0)
MCV: 87.7 fL (ref 78.0–100.0)
Platelets: 180 10*3/uL (ref 150–400)
RBC: 4.16 MIL/uL (ref 3.87–5.11)
RDW: 14 % (ref 11.5–15.5)
WBC: 7.3 10*3/uL (ref 4.0–10.5)

## 2013-07-01 MED ORDER — BOOST / RESOURCE BREEZE PO LIQD
1.0000 | ORAL | Status: DC
  Administered 2013-07-02 – 2013-07-03 (×2): 1 via ORAL

## 2013-07-01 MED ORDER — ENSURE COMPLETE PO LIQD
237.0000 mL | ORAL | Status: DC
  Administered 2013-07-02 – 2013-07-03 (×2): 237 mL via ORAL

## 2013-07-01 MED ORDER — METRONIDAZOLE 500 MG PO TABS
500.0000 mg | ORAL_TABLET | Freq: Three times a day (TID) | ORAL | Status: DC
  Administered 2013-07-01 – 2013-07-03 (×6): 500 mg via ORAL
  Filled 2013-07-01 (×8): qty 1

## 2013-07-01 NOTE — Progress Notes (Signed)
[facility] PROGRESS NOTE  Kristy Oliver WUJ:[id number] DOB: 04-Jun-1945 DOA: 06/30/2013 PCP: Angelica Chessman, MD  Assessment/Plan: AKI  -Secondary to volume depletion compounded with lisinopril and cochicine and NSAID (aleve and mobic) use  -Continue IV fluids  -Renal ultrasound no improvement  -Discontinue lisinopril   Diarrhea  -Stool pathogen panel  -cdiff PCR  -FOBT neg in ED  -CT abdomen pelvis negative for any acute intra-abdominal pathology   Rheumatoid arthritis  -Patient was previously on Plaquenil, but could no longer afford it after she lost her Medicaid  -She follows up with what appears to be a rheumatologist at [facility]  -She is currently not on DMARD tx   Tophaceous gout  -Does not appear to be having an acute flareup  -Discontinue cochicine   Scalp subcutaneous nodules  -Do not appear to be inflamed on exam  -Followup as an outpatient  -??related to RA   Polysubstance abuse  -Including tobacco and THC  -counseled to quit  PT eval  Code Status: full Family Communication: patient Disposition Plan: home 1-2 days   Consultants:  none  Procedures:    Antibiotics:    HPI/Subjective: Feeling weak  Objective: Filed Vitals:   07/01/13 0856  BP: 107/71  Pulse: 65  Temp: 99 F (37.2 C)  Resp: 18    Intake/Output Summary (Last 24 hours) at 07/01/13 1155 Last data filed at 07/01/13 0828  Gross per 24 hour  Intake   1110 ml  Output   2100 ml  Net   -990 ml   Filed Weights   06/30/13 1124 06/30/13 1930  Weight: 65.772 kg (145 lb) 63.186 kg (139 lb 4.8 oz)    Exam:   General:  A+Ox3, NAD  Cardiovascular: rrr  Respiratory: clear  Abdomen: +Bs, soft  Musculoskeletal: moves all 4 ext  Data Reviewed: Basic Metabolic Panel:  Recent Labs Lab 06/30/13 1210 07/01/13 0508  NA 135* 138  K 5.3 5.1  CL 98 108  CO2 13* 14*  GLUCOSE 69* 64*  BUN 65* 39*  CREATININE 1.88* 0.99  CALCIUM 9.6 8.6   Liver Function  Tests:  Recent Labs Lab 06/30/13 1210  AST 36  ALT 22  ALKPHOS 161*  BILITOT 0.3  PROT 8.6*  ALBUMIN 3.6    Recent Labs Lab 06/30/13 1210  LIPASE 25   No results found for this basename: AMMONIA,  in the last 168 hours CBC:  Recent Labs Lab 06/30/13 1210 07/01/13 0508  WBC 8.3 7.3  NEUTROABS 6.2  --   HGB 15.4* 12.9  HCT 42.9 36.5  MCV 88.1 87.7  PLT 226 180   Cardiac Enzymes: No results found for this basename: CKTOTAL, CKMB, CKMBINDEX, TROPONINI,  in the last 168 hours BNP (last 3 results) No results found for this basename: PROBNP,  in the last 8760 hours CBG: No results found for this basename: GLUCAP,  in the last 168 hours  No results found for this or any previous visit (from the past 240 hour(s)).   Studies: Ct Abdomen Pelvis Wo Contrast  06/30/2013   CLINICAL DATA:  Diarrhea  EXAM: CT ABDOMEN AND PELVIS WITHOUT CONTRAST  TECHNIQUE: Multidetector CT imaging of the abdomen and pelvis was performed following the standard protocol without intravenous contrast.  COMPARISON:  None.  FINDINGS: Lung bases are free of acute infiltrate or sizable effusion.  The liver, gallbladder, spleen, adrenal glands and pancreas are within normal limits. Evaluation is somewhat limited due to lack of IV contrast. The kidneys are well visualized  without renal calculi or obstructive changes. No definitive mass lesion is seen. The appendix is well visualized and within normal limits. No periappendiceal inflammatory changes are noted. The bladder is well distended. Some calcified uterine fibroids are noted. An IUD is seen within the pelvis but appears to lie extrinsic to the uterine body. No free pelvic fluid is seen.  IMPRESSION: No acute abnormality is identified.  An IUD is seen within the pelvis but appears to lie extrinsic to uterus. This is likely been present for quite sometime. Clinical correlation is recommended.   Electronically Signed   By: Inez Catalina M.D.   On: 06/30/2013 16:42    Ct Head Wo Contrast  06/30/2013   CLINICAL DATA:  Palpable abnormality in the occipital region.  EXAM: CT HEAD WITHOUT CONTRAST  TECHNIQUE: Contiguous axial images were obtained from the base of the skull through the vertex without intravenous contrast.  COMPARISON:  None.  FINDINGS: Calvarium is intact. There is no osseous lesion in the occipital region. There is some mild increased soft tissue attenuation over the occipital scalp (image number 15), that may represent a subcutaneous lesion.  No mass lesion, mass effect, midline shift, hydrocephalus, hemorrhage. No acute territorial cortical ischemia/infarct. Atrophy and chronic ischemic white matter disease is present. Atrophy is age appropriate and mild. Intracranial atherosclerosis is incidentally noted.  IMPRESSION: 1. Chronic ischemic white matter disease without acute intracranial abnormality. 2. No bony abnormality in the occipital region. Very mild subcutaneous infiltration near the occiput is nonspecific on CT and may represent subcutaneous lesions.   Electronically Signed   By: Dereck Ligas M.D.   On: 06/30/2013 13:00    Scheduled Meds: . heparin  5,000 Units Subcutaneous Q8H   Continuous Infusions: . sodium chloride 100 mL/hr at 07/01/13 0725    Active Problems:   AKI (acute kidney injury)   Dehydration   RA (rheumatoid arthritis)   Diarrhea   Tobacco use    Time spent: 35 min    Kristy Oliver  [facility] Pager [phone]. If 7PM-7AM, please contact night-coverage at [email or web address], password [facility] 07/01/2013, 11:55 AM  LOS: 1 day

## 2013-07-01 NOTE — Evaluation (Signed)
Physical Therapy Evaluation Patient Details Name: Kristy Oliver MRN: [id number] DOB: [birth year] Today's Date: 07/01/2013 Time: 6578-4696 PT Time Calculation (min): 25 min  PT Assessment / Plan / Recommendation History of Present Illness  68 year old female with a history of rheumatoid arthritis, hypertension, gouty arthritis presents with 1-1/2 week history of loose stools.  The patient states that she has had increasing generalized weakness with decreased oral intake over the past 4-5 days.  Patient with AKI due to volume depletion.    Clinical Impression  Patient presents with problems listed below.  Will benefit from acute PT to maximize independence prior to return home.  Recommend HHPT and 24 hour assist at discharge.    PT Assessment  Patient needs continued PT services    Follow Up Recommendations  Home health PT;Supervision/Assistance - 24 hour    Does the patient have the potential to tolerate intense rehabilitation      Barriers to Discharge Decreased caregiver support;Inaccessible home environment Question 24 hour assist present; Flight of stairs to reach bedroom/bathroom    Equipment Recommendations  3in1 (PT); Rollator    Recommendations for Other Services     Frequency Min 3X/week    Precautions / Restrictions Precautions Precautions: Fall Restrictions Weight Bearing Restrictions: No   Pertinent Vitals/Pain       Mobility  Bed Mobility Overal bed mobility: Needs Assistance Bed Mobility: Supine to Sit;Sit to Supine Supine to sit: Supervision;HOB elevated Sit to supine: Supervision;HOB elevated General bed mobility comments: Verbal cues for technique.  Supervision for safety only. Transfers Overall transfer level: Needs assistance Equipment used: Straight cane Transfers: Sit to/from Stand Sit to Stand: Min assist General transfer comment: Verbal cues for hand placement.  Assist for balance/safety during transfers. Ambulation/Gait Ambulation/Gait  assistance: Min assist Ambulation Distance (Feet): 24 Feet Assistive device: Straight cane Gait Pattern/deviations: Step-through pattern;Decreased step length - right;Decreased step length - left;Decreased stride length;Shuffle;Trunk flexed Gait velocity: Slow gait speed Gait velocity interpretation: Below normal speed for age/gender General Gait Details: Verbal cues for safe use of cane.  Assist for balance/safety.  Patient with loss of balance during turns.    Exercises     PT Diagnosis: Difficulty walking;Abnormality of gait;Generalized weakness  PT Problem List: Decreased strength;Decreased activity tolerance;Decreased balance;Decreased mobility;Decreased knowledge of use of DME;Cardiopulmonary status limiting activity;Impaired sensation PT Treatment Interventions: DME instruction;Gait training;Stair training;Functional mobility training;Therapeutic exercise;Patient/family education     PT Goals(Current goals can be found in the care plan section) Acute Rehab PT Goals Patient Stated Goal: To get stronger PT Goal Formulation: With patient Time For Goal Achievement: 07/08/13 Potential to Achieve Goals: Good  Visit Information  Last PT Received On: 07/01/13 Assistance Needed: +1 History of Present Illness: 68 year old female with a history of rheumatoid arthritis, hypertension, gouty arthritis presents with 1-1/2 week history of loose stools.  The patient states that she has had increasing generalized weakness with decreased oral intake over the past 4-5 days.  Patient with AKI due to volume depletion.         Prior [place] expects to be discharged to:: Private residence Living Arrangements: Other relatives Yolanda Bonine) Available Help at Discharge: Family;Available 24 hours/day (Per 2 grandsons, one of them is with patient at all times) Type of Home: House ([place]) Home Access: Stairs to enter Technical brewer of Steps: 1 Entrance Stairs-Rails:  None Home Layout: Two level;Bed/bath upstairs Alternate Level Stairs-Number of Steps: flight Alternate Level Stairs-Rails: Right Home Equipment: Cane - single point Prior Function Level of Independence: Independent  with assistive device(s);Needs assistance Gait / Transfers Assistance Needed: Patient uses cane ADL's / Homemaking Assistance Needed: Assist with meal prep.  Assist/supervision for bathing. Communication Communication: No difficulties    Cognition  Cognition Arousal/Alertness: Awake/alert Behavior During Therapy: WFL for tasks assessed/performed Overall Cognitive Status: Within Functional Limits for tasks assessed    Extremity/Trunk Assessment Upper Extremity Assessment Upper Extremity Assessment: Generalized weakness;RUE deficits/detail;LUE deficits/detail RUE Deficits / Details: Note arthritic changes at elbows and hands.  Decreased shoulder flexion to 90 degrees. LUE Deficits / Details: Note arthritic changes at elbows and hands.  Decreased shoulder flexion to 90 degrees. Lower Extremity Assessment Lower Extremity Assessment: Generalized weakness;RLE deficits/detail;LLE deficits/detail RLE Sensation: decreased light touch (lower leg/foot) RLE Coordination: decreased gross motor LLE Sensation: decreased light touch (lower leg/foot) LLE Coordination: decreased gross motor Cervical / Trunk Assessment Cervical / Trunk Assessment: Kyphotic   Balance Balance Overall balance assessment: Needs assistance Standing balance support: Single extremity supported Standing balance-Leahy Scale: Fair High level balance activites: Turns;Head turns High Level Balance Comments: Patient with decreased balance with high level balance activities  End of Session PT - End of Session Equipment Utilized During Treatment: Gait belt Activity Tolerance: Patient limited by fatigue Patient left: in bed;with call bell/phone within reach;with family/visitor present Nurse Communication: Mobility  status (Patient requests sleep aid; family with questions)  GP     Despina Pole 07/01/2013, 6:38 PM Carita Pian. Sanjuana Kava, [place] Pager [phone]

## 2013-07-01 NOTE — Progress Notes (Signed)
INITIAL NUTRITION ASSESSMENT  DOCUMENTATION CODES Per approved criteria  -Severe malnutrition in the context of acute illness or injury  Pt meets criteria for SEVERE MALNUTRITION in the context of ACUTE ILLNESS as evidenced by 4% weight loss in less than 2 weeks, estimated energy intake <50% of estimated energy needs for >5 days, and mild to moderate fat and muscle wasting.  INTERVENTION: Provide Ensure Complete once daily Provide Resource Breeze once daily Encourage PO intake  NUTRITION DIAGNOSIS: Inadequate oral intake related to poor appetite and diarrhea as evidenced by PO intake < 50% of meals for > 1 week and reported 4% weight loss.   Goal: Pt to meet >/= 90% of their estimated nutrition needs   Monitor:  PO intake, weight, labs  Reason for Assessment: Malnutrition Screening Tool  68 y.o. female  Admitting Dx: <principal problem not specified>  ASSESSMENT: 68 year old female with a history of rheumatoid arthritis, hypertension, gouty arthritis presents with 1-1/2 week history of loose stools. Approximately 4-5 days ago, the patient began having lower abdominal cramping and pain.   Pt states that for the past week and a half she has had diarrhea and a poor appetite. She states that during this time she has been eating very little, mostly fruit. Pt ate half a sandwich and some juice for lunch and states this is more than she was eating at meals PTA. Pt states she used to weigh 145 lbs and has lost 6 lbs in the past week and a half. Pt also reports that a year ago she weighed over 200 lbs and lost down to 145 lbs within 3 months, she has been maintaining her weight since.   Nutrition Focused Physical Exam:  Subcutaneous Fat:  Orbital Region: wnl Upper Arm Region: moderate wasting Thoracic and Lumbar Region: NA  Muscle:  Temple Region: wnl Clavicle Bone Region: moderate wasting Clavicle and Acromion Bone Region: mild wasting Scapular Bone Region: NA Dorsal Hand: mild  wasting Patellar Region: wnl Anterior Thigh Region: mild wasting Posterior Calf Region: wnl  Edema: none  Height: Ht Readings from Last 1 Encounters:  06/30/13 5\' 2"  (1.575 m)    Weight: Wt Readings from Last 1 Encounters:  06/30/13 139 lb 4.8 oz (63.186 kg)    Ideal Body Weight: 110 lbs  % Ideal Body Weight: 126%  Wt Readings from Last 10 Encounters:  06/30/13 139 lb 4.8 oz (63.186 kg)  11/17/12 145 lb 15.1 oz (66.2 kg)    Usual Body Weight: 145 lbs  % Usual Body Weight: 96%  BMI:  Body mass index is 25.47 kg/(m^2).  Estimated Nutritional Needs: Kcal: 1400-1600 Protein: 65-75 grams Fluid: 1.4-1.6 L/day  Skin: WDL  Diet Order: Cardiac  EDUCATION NEEDS: -No education needs identified at this time   Intake/Output Summary (Last 24 hours) at 07/01/13 1656 Last data filed at 07/01/13 0828  Gross per 24 hour  Intake   1110 ml  Output   2100 ml  Net   -990 ml    Last BM: 2/6   Labs:   Recent Labs Lab 06/30/13 1210 07/01/13 0508  NA 135* 138  K 5.3 5.1  CL 98 108  CO2 13* 14*  BUN 65* 39*  CREATININE 1.88* 0.99  CALCIUM 9.6 8.6  GLUCOSE 69* 64*    CBG (last 3)  No results found for this basename: GLUCAP,  in the last 72 hours  Scheduled Meds: . heparin  5,000 Units Subcutaneous Q8H    Continuous Infusions: . sodium chloride 100 mL/hr  at 07/01/13 0725    Past Medical History  Diagnosis Date  . Hypertension   . Arthritis   . Renal disorder   . Gout     Past Surgical History  Procedure Laterality Date  . Breast lumpectomy    . Joint replacement      left knee & left shoulder  . Left breast cyst removal    . C-section x 2      Pryor Ochoa RD, LDN Inpatient Clinical Dietitian Pager: [phone] After Hours Pager: [phone]

## 2013-07-02 DIAGNOSIS — M069 Rheumatoid arthritis, unspecified: Secondary | ICD-10-CM | POA: Diagnosis not present

## 2013-07-02 DIAGNOSIS — N179 Acute kidney failure, unspecified: Secondary | ICD-10-CM | POA: Diagnosis not present

## 2013-07-02 DIAGNOSIS — K529 Noninfective gastroenteritis and colitis, unspecified: Secondary | ICD-10-CM | POA: Diagnosis present

## 2013-07-02 DIAGNOSIS — E860 Dehydration: Secondary | ICD-10-CM | POA: Diagnosis not present

## 2013-07-02 DIAGNOSIS — R197 Diarrhea, unspecified: Secondary | ICD-10-CM | POA: Diagnosis not present

## 2013-07-02 NOTE — Progress Notes (Signed)
[facility] PROGRESS NOTE  Kristy Oliver [id number]:[id number] DOB: [birth year] DOA: 06/30/2013 PCP: Angelica Chessman, MD  Assessment/Plan: AKI  Resolved. D/c IVF after current bag  c diff colitis: On flagyl  Rheumatoid arthritis  -Patient was previously on Plaquenil, but could no longer afford it after she lost her Medicaid  -She follows up with what appears to be a rheumatologist at [facility]  -She is currently not on DMARD tx   Tophaceous gout  -Does not appear to be having an acute flareup  -Discontinue cochicine   Scalp subcutaneous nodules  -Do not appear to be inflamed on exam  -Followup as an outpatient  -??related to RA   Polysubstance abuse  -Including tobacco and THC  -counseled to quit  Home PT  Code Status: full Family Communication: patient Disposition Plan: home 1-2 days   Consultants:  none  Procedures:    Antibiotics:    HPI/Subjective: Stronger. Diarrhea improving.  Objective: Filed Vitals:   07/02/13 1145  BP: 97/61  Pulse: 78  Temp: 99 F (37.2 C)  Resp: 18    Intake/Output Summary (Last 24 hours) at 07/02/13 1731 Last data filed at 07/02/13 1220  Gross per 24 hour  Intake 2158.33 ml  Output    601 ml  Net 1557.33 ml   Filed Weights   06/30/13 1124 06/30/13 1930  Weight: 65.772 kg (145 lb) 63.186 kg (139 lb 4.8 oz)    Exam:   General:  A+Ox3, NAD  Cardiovascular: rrr without MGR  Respiratory: clear without WRR  Abdomen: +Bs, soft  Musculoskeletal: no CCE  Data Reviewed: Basic Metabolic Panel:  Recent Labs Lab 06/30/13 1210 07/01/13 0508  NA 135* 138  K 5.3 5.1  CL 98 108  CO2 13* 14*  GLUCOSE 69* 64*  BUN 65* 39*  CREATININE 1.88* 0.99  CALCIUM 9.6 8.6   Liver Function Tests:  Recent Labs Lab 06/30/13 1210  AST 36  ALT 22  ALKPHOS 161*  BILITOT 0.3  PROT 8.6*  ALBUMIN 3.6    Recent Labs Lab 06/30/13 1210  LIPASE 25   No results found for this basename: AMMONIA,  in the last 168  hours CBC:  Recent Labs Lab 06/30/13 1210 07/01/13 0508  WBC 8.3 7.3  NEUTROABS 6.2  --   HGB 15.4* 12.9  HCT 42.9 36.5  MCV 88.1 87.7  PLT 226 180   Cardiac Enzymes: No results found for this basename: CKTOTAL, CKMB, CKMBINDEX, TROPONINI,  in the last 168 hours BNP (last 3 results) No results found for this basename: PROBNP,  in the last 8760 hours CBG: No results found for this basename: GLUCAP,  in the last 168 hours  Recent Results (from the past 240 hour(s))  CLOSTRIDIUM DIFFICILE BY PCR     Status: Abnormal   Collection Time    07/01/13  3:30 PM      Result Value Range Status   C difficile by pcr POSITIVE (*) NEGATIVE Final   Comment: CRITICAL RESULT CALLED TO, READ BACK BY AND VERIFIED WITH:     [place] RN [id number] 07/01/13 BY Karie Chimera     Studies: No results found.  Scheduled Meds: . feeding supplement (ENSURE COMPLETE)  237 mL Oral Q24H  . feeding supplement (RESOURCE BREEZE)  1 Container Oral Q24H  . heparin  5,000 Units Subcutaneous Q8H  . metroNIDAZOLE  500 mg Oral Q8H   Continuous Infusions: . sodium chloride 100 mL/hr (07/02/13 1020)    Time spent: 25 min  Colin Ellers L  [facility] Pager [phone]. If 7PM-7AM, please contact night-coverage at [email or web address], password [facility] 07/02/2013, 5:31 PM  LOS: 2 days

## 2013-07-03 DIAGNOSIS — M069 Rheumatoid arthritis, unspecified: Secondary | ICD-10-CM | POA: Diagnosis not present

## 2013-07-03 DIAGNOSIS — A0472 Enterocolitis due to Clostridium difficile, not specified as recurrent: Secondary | ICD-10-CM

## 2013-07-03 DIAGNOSIS — N179 Acute kidney failure, unspecified: Secondary | ICD-10-CM | POA: Diagnosis not present

## 2013-07-03 LAB — GI PATHOGEN PANEL BY PCR, STOOL
C difficile toxin A/B: POSITIVE
CAMPYLOBACTER BY PCR: POSITIVE
Cryptosporidium by PCR: NEGATIVE
E COLI 0157 BY PCR: NEGATIVE
E coli (ETEC) LT/ST: NEGATIVE
E coli (STEC): NEGATIVE
G LAMBLIA BY PCR: NEGATIVE
Norovirus GI/GII: NEGATIVE
Rotavirus A by PCR: NEGATIVE
Salmonella by PCR: POSITIVE
Shigella by PCR: NEGATIVE

## 2013-07-03 LAB — BASIC METABOLIC PANEL
BUN: 7 mg/dL (ref 6–23)
CALCIUM: 8.4 mg/dL (ref 8.4–10.5)
CO2: 19 meq/L (ref 19–32)
Chloride: 108 mEq/L (ref 96–112)
Creatinine, Ser: 0.68 mg/dL (ref 0.50–1.10)
GFR calc non Af Amer: 90 mL/min — ABNORMAL LOW (ref >90)
Glucose, Bld: 117 mg/dL — ABNORMAL HIGH (ref 70–99)
Potassium: 3.8 mEq/L (ref 3.7–5.3)
SODIUM: 140 meq/L (ref 137–147)

## 2013-07-03 MED ORDER — METRONIDAZOLE 500 MG PO TABS: 500.0000 mg | ORAL_TABLET | Freq: Three times a day (TID) | ORAL | Status: DC

## 2013-07-03 MED ORDER — COLCHICINE 0.6 MG PO TABS: 0.6000 mg | ORAL_TABLET | Freq: Every day | ORAL | Status: DC

## 2013-07-03 NOTE — Discharge Summary (Signed)
Physician Discharge Summary  Kristy Oliver [id number]:[id number] DOB: 1946-03-07 DOA: 06/30/2013  PCP: Angelica Chessman, MD  Admit date: 06/30/2013 Discharge date: 07/03/2013  Time spent: greater than 30 min  Recommendations for Outpatient Follow-up:  1. Home RN arranged  Discharge Diagnoses:    Enteritis due to Clostridium difficile Active Problems:   AKI (acute kidney injury)   Dehydration   RA (rheumatoid arthritis)    Tobacco use   Protein-calorie malnutrition, severe   Gastroenteritis   Discharge Condition: stable  Filed Weights   06/30/13 1124 06/30/13 1930 07/02/13 2153  Weight: 65.772 kg (145 lb) 63.186 kg (139 lb 4.8 oz) 64.1 kg (141 lb 5 oz)    History of present illness:  68 year old female with a history of rheumatoid arthritis, hypertension, gouty arthritis presents with 1-1/2 week history of loose stools. Approximately 4-5 days ago, the patient began having lower abdominal cramping and pain. There was no hematochezia or melena. She denied any fevers but complains of some chills. She denied any chest discomfort, shortness breath, coughing, hemoptysis, nausea, vomiting, dysuria, hematuria, new rashes. The patient states that she has had increasing generalized weakness with decreased oral intake over the past 4-5 days. The patient denies any recent travels, eating raw or undercooked foods, or sick contacts. There been no new medications. She denies any recent antibiotics.  Of note, the patient was noted to have +CCP antibody on 12/26/12. She states that she went to see a physician at [place] around September 2014. The patient was placed on culture seen 0.6 mg twice a day which she has been taking regularly. In addition, the patient has been taking Aleve twice a day for the past 1-1/2 months for her joint pains. She continues to smoke approximately 1/4 pack per day of cigarettes. However, she has smoked up to one pack per day x50 years. She has also been using marijuana for at  least the last 15 years. She denies any alcohol or other illegal drugs.  In ED, the patient was noted to have a serum creatinine of 1.8, potassium 5.3, hemoglobin 15.4. Urinalysis was negative for pyuria. CT abdomen and pelvis was negative for any acute abnormalities. CT of the brain did not show any acute intracranial changes, but did show some soft tissue attenuation in the occipital scalp of what appeared to be some subQ nodules. The patient was given 2 L normal saline in the ED.   Hospital Course:  Patient started on IVF, supportive care.  Stool positive for c diff pcr. Started on flagyl. Renal function, diarrhea, hyperkalemia all improved. By discharge, normal vital signs, tolerating solids and ambulating with walker.  Procedures:  none  Consultations:  none  Discharge Exam: Filed Vitals:   07/03/13 1000  BP: 130/75  Pulse: 72  Temp: 98.3 F (36.8 C)  Resp: 18    General: nontoxic, comfortable Cardiovascular: RRR Respiratory: CTA Abd: s, nt, nd  Discharge Instructions  Discharge Orders   Future Orders Complete By Expires   Diet - low sodium heart healthy  As directed    Increase activity slowly  As directed        Medication List    STOP taking these medications       lisinopril 20 MG tablet  Commonly known as:  PRINIVIL,ZESTRIL     meloxicam 15 MG tablet  Commonly known as:  MOBIC      TAKE these medications       colchicine 0.6 MG tablet  Take 1 tablet (0.6 mg total)  by mouth daily.     metroNIDAZOLE 500 MG tablet  Commonly known as:  FLAGYL  Take 1 tablet (500 mg total) by mouth every 8 (eight) hours.       Allergies  Allergen Reactions  . Iodinated Diagnostic Agents     unknown  . Motrin [Ibuprofen]     States she is only allergic to 800 mg formulation  . Shrimp [Shellfish Allergy] Hives, Swelling and Rash       Follow-up Information   Follow up with JEGEDE, OLUGBEMIGA, MD In 1 week.   Specialty:  Internal Medicine   Contact  information:   [place] [address] [phone]        The results of significant diagnostics from this hospitalization (including imaging, microbiology, ancillary and laboratory) are listed below for reference.    Significant Diagnostic Studies: Ct Abdomen Pelvis Wo Contrast  06/30/2013   CLINICAL DATA:  Diarrhea  EXAM: CT ABDOMEN AND PELVIS WITHOUT CONTRAST  TECHNIQUE: Multidetector CT imaging of the abdomen and pelvis was performed following the standard protocol without intravenous contrast.  COMPARISON:  None.  FINDINGS: Lung bases are free of acute infiltrate or sizable effusion.  The liver, gallbladder, spleen, adrenal glands and pancreas are within normal limits. Evaluation is somewhat limited due to lack of IV contrast. The kidneys are well visualized without renal calculi or obstructive changes. No definitive mass lesion is seen. The appendix is well visualized and within normal limits. No periappendiceal inflammatory changes are noted. The bladder is well distended. Some calcified uterine fibroids are noted. An IUD is seen within the pelvis but appears to lie extrinsic to the uterine body. No free pelvic fluid is seen.  IMPRESSION: No acute abnormality is identified.  An IUD is seen within the pelvis but appears to lie extrinsic to uterus. This is likely been present for quite sometime. Clinical correlation is recommended.   Electronically Signed   By: Inez Catalina M.D.   On: 06/30/2013 16:42   Ct Head Wo Contrast  06/30/2013   CLINICAL DATA:  Palpable abnormality in the occipital region.  EXAM: CT HEAD WITHOUT CONTRAST  TECHNIQUE: Contiguous axial images were obtained from the base of the skull through the vertex without intravenous contrast.  COMPARISON:  None.  FINDINGS: Calvarium is intact. There is no osseous lesion in the occipital region. There is some mild increased soft tissue attenuation over the occipital scalp (image number 15), that may represent a  subcutaneous lesion.  No mass lesion, mass effect, midline shift, hydrocephalus, hemorrhage. No acute territorial cortical ischemia/infarct. Atrophy and chronic ischemic white matter disease is present. Atrophy is age appropriate and mild. Intracranial atherosclerosis is incidentally noted.  IMPRESSION: 1. Chronic ischemic white matter disease without acute intracranial abnormality. 2. No bony abnormality in the occipital region. Very mild subcutaneous infiltration near the occiput is nonspecific on CT and may represent subcutaneous lesions.   Electronically Signed   By: Dereck Ligas M.D.   On: 06/30/2013 13:00    Microbiology: Recent Results (from the past 240 hour(s))  CLOSTRIDIUM DIFFICILE BY PCR     Status: Abnormal   Collection Time    07/01/13  3:30 PM      Result Value Range Status   C difficile by pcr POSITIVE (*) NEGATIVE Final   Comment: CRITICAL RESULT CALLED TO, READ BACK BY AND VERIFIED WITH:     ANITA MINTZ BH [id number] 07/01/13 BY WOOLLENK     Labs: Basic Metabolic Panel:  Recent Labs  Lab 06/30/13 1210 07/01/13 0508 07/03/13 0651  NA 135* 138 140  K 5.3 5.1 3.8  CL 98 108 108  CO2 13* 14* 19  GLUCOSE 69* 64* 117*  BUN 65* 39* 7  CREATININE 1.88* 0.99 0.68  CALCIUM 9.6 8.6 8.4   Liver Function Tests:  Recent Labs Lab 06/30/13 1210  AST 36  ALT 22  ALKPHOS 161*  BILITOT 0.3  PROT 8.6*  ALBUMIN 3.6    Recent Labs Lab 06/30/13 1210  LIPASE 25   No results found for this basename: AMMONIA,  in the last 168 hours CBC:  Recent Labs Lab 06/30/13 1210 07/01/13 0508  WBC 8.3 7.3  NEUTROABS 6.2  --   HGB 15.4* 12.9  HCT 42.9 36.5  MCV 88.1 87.7  PLT 226 180   Cardiac Enzymes: No results found for this basename: CKTOTAL, CKMB, CKMBINDEX, TROPONINI,  in the last 168 hours BNP: BNP (last 3 results) No results found for this basename: PROBNP,  in the last 8760 hours CBG: No results found for this basename: GLUCAP,  in the last 168  hours     Signed:  Adreyan Carbajal L  [facility] 07/03/2013, 1:22 PM

## 2013-07-03 NOTE — Progress Notes (Signed)
   CARE MANAGEMENT NOTE 07/03/2013  Patient:  Kristy Oliver, Kristy Oliver   Account Number:  [id number]  Date Initiated:  07/03/2013  Documentation initiated by:  Lizabeth Leyden  Subjective/Objective Assessment:   admit with diarrhea, +Cdiff  lives at home with grandsons     Action/Plan:   progression of care and discharge planning  Home health RN, PT   Anticipated DC Date:  07/03/2013   Anticipated DC Plan:  [place]  CM consult      [facility] Choice  HOME HEALTH   Choice offered to / List presented to:  C-1 Patient   DME arranged  3-N-1  Vassie Moselle      DME agency  [place] arranged  [place] RN      [place].   Status of service:  Completed, signed off Medicare Important Message given?   (If response is "NO", the following Medicare IM given date fields will be blank) Date Medicare IM given:   Date Additional Medicare IM given:    Discharge Disposition:  [place]  Per UR Regulation:    If discussed at Long Length of Stay Meetings, dates discussed:    Comments:  07/03/2013  [place], [place] CM referral: home health  RN, PT  Met with patient regarding home health choice. NCM to make [facility]. She lives at home with her grandsons, will need a rollator and 3:1. Dr Conley Canal made aware of HHPT not covered.  [place] called with referral. She verified the patient has Medicare Part B, does not have part A. Medicaid does not cover HHPT.  [facility] called with referral for 3:1 and rollator

## 2013-07-03 NOTE — Progress Notes (Signed)
Physical Therapy Treatment Patient Details Name: Kristy Oliver MRN: [id number] DOB: [birth year] Today's Date: 07/03/2013 Time: 5284-1324 PT Time Calculation (min): 24 min  PT Assessment / Plan / Recommendation  History of Present Illness 68 year old female with a history of rheumatoid arthritis, hypertension, gouty arthritis presents with 1-1/2 week history of loose stools.  The patient states that she has had increasing generalized weakness with decreased oral intake over the past 4-5 days.  Patient with AKI due to volume depletion.     PT Comments   Patient more steady and able to walk farther with walker.  C/o wrist pain with standing exercises with UE support on sink.  Pt advised to speak with MD if feels gout is flaring.  Will need HHPT to educate on safe use of rollator, but feel this is best device given her other comorbidities.  Follow Up Recommendations  Home health PT;Supervision/Assistance - 24 hour     Does the patient have the potential to tolerate intense rehabilitation   N/A  Barriers to Discharge  None      Equipment Recommendations  3in1 (PT) (Rollator)    Recommendations for Other Services  None  Frequency Min 3X/week   Progress towards PT Goals Progress towards PT goals: Progressing toward goals  Plan Current plan remains appropriate    Precautions / Restrictions Precautions Precautions: Fall   Pertinent Vitals/Pain Wrist pain with weight bearing    Mobility  Bed Mobility Overal bed mobility: Modified Independent Transfers Overall transfer level: Modified independent General transfer comment: stabilized independent standing from bed no device with UE support Ambulation/Gait Ambulation/Gait assistance: Supervision Ambulation Distance (Feet): 130 Feet Assistive device: Rolling walker (2 wheeled) General Gait Details: no loss of balance with walker, did note safe technique turning walker, however due to walker dragging floor, pt lifted one side to prevent  noise (educated unsafe to lift leg of walker while walking.)    Exercises General Exercises - Lower Extremity Hip ABduction/ADduction: AROM;Standing;Both;Other reps (comment) (7) Hip Flexion/Marching: AROM;Both;Standing;Other reps (comment) (7) Heel Raises: AROM;Both;5 reps;Standing Other Exercises Other Exercises: educated on safety with walker in the home and discussed differences in 4 wheeled walker opposed to 2 wheel walker,  Also demonstrated up one step with walker     PT Goals (current goals can now be found in the care plan section)    Visit Information  Last PT Received On: 07/03/13 Assistance Needed: +1 History of Present Illness: 68 year old female with a history of rheumatoid arthritis, hypertension, gouty arthritis presents with 1-1/2 week history of loose stools.  The patient states that she has had increasing generalized weakness with decreased oral intake over the past 4-5 days.  Patient with AKI due to volume depletion.      Subjective Data      Cognition  Cognition Arousal/Alertness: Awake/alert Behavior During Therapy: WFL for tasks assessed/performed Overall Cognitive Status: Within Functional Limits for tasks assessed    Balance  Balance Standing balance-Leahy Scale: Fair  End of Session PT - End of Session Equipment Utilized During Treatment: Gait belt Activity Tolerance: Patient tolerated treatment well Patient left: in bed;with call bell/phone within reach;with bed alarm set   GP     [facility] 07/03/2013, 11:39 AM Magda Kiel, [place] 07/03/2013

## 2013-07-28 ENCOUNTER — Ambulatory Visit: Payer: Medicare Other | Attending: Internal Medicine | Admitting: Internal Medicine

## 2013-07-28 VITALS — BP 140/101 | HR 81 | Temp 98.2°F | Resp 14 | Ht 62.0 in | Wt 143.0 lb

## 2013-07-28 DIAGNOSIS — R630 Anorexia: Secondary | ICD-10-CM

## 2013-07-28 DIAGNOSIS — M069 Rheumatoid arthritis, unspecified: Secondary | ICD-10-CM

## 2013-07-28 DIAGNOSIS — I10 Essential (primary) hypertension: Secondary | ICD-10-CM

## 2013-07-28 MED ORDER — LISINOPRIL 20 MG PO TABS: 20.0000 mg | ORAL_TABLET | Freq: Every day | ORAL | Status: DC

## 2013-07-28 MED ORDER — COLCHICINE 0.6 MG PO TABS: 0.6000 mg | ORAL_TABLET | Freq: Every day | ORAL | Status: DC

## 2013-07-28 MED ORDER — DRONABINOL 2.5 MG PO CAPS: 2.5000 mg | ORAL_CAPSULE | Freq: Two times a day (BID) | ORAL | Status: DC

## 2013-07-28 NOTE — Progress Notes (Unsigned)
Pt is here following up on her arthritis and HTN

## 2013-07-28 NOTE — Progress Notes (Unsigned)
Patient ID: Kristy Oliver, female   DOB: Oct 14, 1945, 68 y.o.   MRN: [id number]   HPI: Kristy Oliver is a 68 y.o. female presenting on 07/28/2013  For hospital f/u after being admitted for C diff. She is overall doing well but was asked to stop her colchicine and Lisinopril and is having joint pains and has HTN . Needs refills as well. Also needs something to stimulate her appetite.     Past Medical History  Diagnosis Date  . Hypertension   . Arthritis   . Renal disorder   . Gout     Past Surgical History  Procedure Laterality Date  . Breast lumpectomy    . Joint replacement      left knee & left shoulder  . Left breast cyst removal    . C-section x 2      Current Outpatient Prescriptions  Medication Sig Dispense Refill  . colchicine 0.6 MG tablet Take 1 tablet (0.6 mg total) by mouth daily.  30 tablet  3  . lisinopril (PRINIVIL,ZESTRIL) 20 MG tablet Take 1 tablet (20 mg total) by mouth daily.  90 tablet  3   No current facility-administered medications for this visit.    Allergies  Allergen Reactions  . Iodinated Diagnostic Agents     unknown  . Motrin [Ibuprofen]     States she is only allergic to 800 mg formulation  . Shrimp [Shellfish Allergy] Hives, Swelling and Rash    History reviewed. No pertinent family history.  History   Social History  . Marital Status: Single    Spouse Name: N/A    Number of Children: N/A  . Years of Education: N/A   Occupational History  . Not on file.   Social History Main Topics  . Smoking status: Current Every Day Smoker -- 0.50 packs/day    Types: Cigarettes  . Smokeless tobacco: Not on file  . Alcohol Use: 8.4 oz/week    14 Cans of beer per week     Comment: 2 cans of beer a day...last one 3 days PTA  . Drug Use: Yes    Special: Marijuana     Comment: quit 60 days ago  . Sexual Activity: No     Comment: IUD still in place from 20 plus years   Other Topics Concern  . Not on file   Social History Narrative  . No  narrative on file    Review of Systems  Review of Systems  Constitutional: Negative for fever, chills, diaphoresis, activity change, appetite change and fatigue.  HENT: Negative for ear pain, nosebleeds, congestion, facial swelling, rhinorrhea, neck pain, neck stiffness and ear discharge.  Eyes: Negative for pain, discharge, redness, itching and visual disturbance.  Respiratory: Negative for cough, choking, chest tightness, shortness of breath, wheezing and stridor.  Cardiovascular: Negative for chest pain, palpitations and leg swelling.  Gastrointestinal: Negative for abdominal distention, vomiting, diarrhea or consitpation Genitourinary: Negative for dysuria, urgency, frequency, hematuria, flank pain, decreased urine volume, difficulty urinating and dyspareunia.  Musculoskeletal: Negative for back pain, joint swelling, arthralgias or gait problem.  Neurological: Negative for dizziness, tremors, seizures, syncope, facial asymmetry, speech difficulty, weakness, light-headedness, numbness and headaches.  Hematological: Negative for adenopathy. Does not bruise/bleed easily.  Psychiatric/Behavioral: Negative for hallucinations, behavioral problems, confusion, dysphoric mood   Objective:  BP 140/101  Pulse 81  Temp(Src) 98.2 F (36.8 C) (Oral)  Resp 14  Ht 5\' 2"  (1.575 m)  Wt 143 lb (64.864 kg)  BMI 26.15 kg/m2  SpO2 97% Filed Weights   07/28/13 1027  Weight: 143 lb (64.864 kg)     Physical Exam  Constitutional: Appears well-developed and well-nourished. No distress. HENT: Normocephalic. External right and left ear normal. Oropharynx is clear and moist.  Eyes: Conjunctivae and EOM are normal. PERRLA, no scleral icterus.  Neck: Normal ROM. Neck supple. No JVD. No tracheal deviation. No thyromegaly.  CVS: RRR, S1/S2 +, no murmurs, no gallops, no carotid bruit.  Pulmonary: Effort and breath sounds normal, no stridor, rhonchi, wheezes, rales.  Abdominal: Soft. BS +,  no distension,  tenderness, rebound or guarding.  Musculoskeletal: Normal range of motion. No edema and no tenderness.  Neuro: Alert. Normal reflexes, muscle tone coordination. No cranial nerve deficit. Skin: Skin is warm and dry. No rash noted. Not diaphoretic. No erythema. No pallor.  Psychiatric: Normal mood and affect. Behavior, judgment, thought content normal.   Lab Results  Component Value Date   WBC 7.3 07/01/2013   HGB 12.9 07/01/2013   HCT 36.5 07/01/2013   MCV 87.7 07/01/2013   PLT 180 07/01/2013   Lab Results  Component Value Date   CREATININE 0.68 07/03/2013   BUN 7 07/03/2013   NA 140 07/03/2013   K 3.8 07/03/2013   CL 108 07/03/2013   CO2 19 07/03/2013    No results found for this basename: HGBA1C   Lipid Panel  No results found for this basename: chol,  trig,  hdl,  cholhdl,  vldl,  ldlcalc        Patient Active Problem List   Diagnosis Date Noted  . Gastroenteritis 07/02/2013  . Protein-calorie malnutrition, severe 07/01/2013  . AKI (acute kidney injury) 06/30/2013  . Dehydration 06/30/2013  . RA (rheumatoid arthritis) 06/30/2013  . Enteritis due to Clostridium difficile 06/30/2013  . Tobacco use 06/30/2013  . Facial cellulitis 11/17/2012  . Poor dentition 11/17/2012  . HTN (hypertension), malignant 11/17/2012     Preventative Medicine:  Health Maintenance  Topic Date Due  . Tetanus/tdap  05/24/1967  . Mammogram  05/23/1998  . Colonoscopy  05/23/1998  . Zostavax  05/23/2008  . Influenza Vaccine  12/23/2012  . Pneumococcal Polysaccharide Vaccine Age 54 And Over  Completed    Adult vaccines due  Topic Date Due  . Tetanus/tdap  05/24/1967  . Zostavax  05/23/2008  . Pneumococcal Polysaccharide Vaccine Age 17 And Over  Completed       Assessment and plan: HTN (hypertension) - resume lisinopril  Rheumatoid arthritis - resume Colchicine (started by rheumatologist) and Alleve  Anorexia - Marinol trial   Return in about 3 months (around 10/28/2013).   The patient was  given clear instructions to go to ER or return to medical center if symptoms don't improve, worsen or new problems develop. The patient verbalized understanding. The patient was told to call to get lab results if they haven't heard anything in the next week.     Debbe Odea, MD

## 2013-08-02 ENCOUNTER — Telehealth: Payer: Self-pay | Admitting: Internal Medicine

## 2013-08-02 NOTE — Telephone Encounter (Signed)
[facility] calling because they have not received response for the following faxed form:  The Home health certification and plan of care form (signed and filled out by physician)  Fax : [phone]

## 2013-09-08 ENCOUNTER — Other Ambulatory Visit: Payer: Self-pay | Admitting: Emergency Medicine

## 2013-09-08 MED ORDER — DRONABINOL 2.5 MG PO CAPS: 2.5000 mg | ORAL_CAPSULE | Freq: Two times a day (BID) | ORAL | Status: DC

## 2013-09-28 ENCOUNTER — Ambulatory Visit: Payer: Medicare Other | Admitting: Internal Medicine

## 2013-10-09 NOTE — Progress Notes (Signed)
Physical Therapy Eval Addendum (late entry for G codes)   2013/07/11 1820  PT Time Calculation  PT Start Time 1731  PT Stop Time 1756  PT Time Calculation (min) 25 min  PT G-Codes **NOT FOR INPATIENT CLASS**  Functional Assessment Tool Used clinical judgement  Functional Limitation Mobility: Walking and moving around  Mobility: Walking and Moving Around Current Status (N8177) CJ  Mobility: Walking and Moving Around Goal Status (N1657) CI  PT General Charges  $$ ACUTE PT VISIT 1 Procedure  PT Evaluation  $Initial PT Evaluation Tier I 1 Procedure  PT Treatments  $Gait Training 8-22 mins  Carita Pian. Sanjuana Kava, [facility] Pager [phone]'

## 2013-10-12 ENCOUNTER — Ambulatory Visit: Payer: Medicare Other | Attending: Internal Medicine | Admitting: Internal Medicine

## 2013-10-12 ENCOUNTER — Encounter: Payer: Self-pay | Admitting: Internal Medicine

## 2013-10-12 VITALS — BP 120/70 | HR 70 | Temp 98.0°F | Resp 16 | Wt 130.0 lb

## 2013-10-12 DIAGNOSIS — F17200 Nicotine dependence, unspecified, uncomplicated: Secondary | ICD-10-CM | POA: Insufficient documentation

## 2013-10-12 DIAGNOSIS — R141 Gas pain: Secondary | ICD-10-CM | POA: Diagnosis not present

## 2013-10-12 DIAGNOSIS — Z96619 Presence of unspecified artificial shoulder joint: Secondary | ICD-10-CM | POA: Insufficient documentation

## 2013-10-12 DIAGNOSIS — Z1211 Encounter for screening for malignant neoplasm of colon: Secondary | ICD-10-CM | POA: Diagnosis not present

## 2013-10-12 DIAGNOSIS — M069 Rheumatoid arthritis, unspecified: Secondary | ICD-10-CM | POA: Diagnosis not present

## 2013-10-12 DIAGNOSIS — Z79899 Other long term (current) drug therapy: Secondary | ICD-10-CM | POA: Insufficient documentation

## 2013-10-12 DIAGNOSIS — R140 Abdominal distension (gaseous): Secondary | ICD-10-CM

## 2013-10-12 DIAGNOSIS — R142 Eructation: Secondary | ICD-10-CM

## 2013-10-12 DIAGNOSIS — Z96659 Presence of unspecified artificial knee joint: Secondary | ICD-10-CM | POA: Insufficient documentation

## 2013-10-12 DIAGNOSIS — R143 Flatulence: Secondary | ICD-10-CM

## 2013-10-12 DIAGNOSIS — M109 Gout, unspecified: Secondary | ICD-10-CM | POA: Insufficient documentation

## 2013-10-12 DIAGNOSIS — I10 Essential (primary) hypertension: Secondary | ICD-10-CM | POA: Insufficient documentation

## 2013-10-12 DIAGNOSIS — Z09 Encounter for follow-up examination after completed treatment for conditions other than malignant neoplasm: Secondary | ICD-10-CM | POA: Diagnosis present

## 2013-10-12 LAB — COMPLETE METABOLIC PANEL WITH GFR
ALT: 14 U/L (ref 0–35)
AST: 23 U/L (ref 0–37)
Albumin: 3.5 g/dL (ref 3.5–5.2)
Alkaline Phosphatase: 153 U/L — ABNORMAL HIGH (ref 39–117)
BILIRUBIN TOTAL: 0.2 mg/dL (ref 0.2–1.2)
BUN: 33 mg/dL — ABNORMAL HIGH (ref 6–23)
CO2: 23 mEq/L (ref 19–32)
Calcium: 9.7 mg/dL (ref 8.4–10.5)
Chloride: 106 mEq/L (ref 96–112)
Creat: 1.06 mg/dL (ref 0.50–1.10)
GFR, EST NON AFRICAN AMERICAN: 55 mL/min — AB
GFR, Est African American: 64 mL/min
GLUCOSE: 119 mg/dL — AB (ref 70–99)
POTASSIUM: 5.3 meq/L (ref 3.5–5.3)
Sodium: 135 mEq/L (ref 135–145)
TOTAL PROTEIN: 8.1 g/dL (ref 6.0–8.3)

## 2013-10-12 MED ORDER — SIMETHICONE 80 MG PO CHEW: 80.0000 mg | CHEWABLE_TABLET | Freq: Four times a day (QID) | ORAL | Status: DC | PRN

## 2013-10-12 NOTE — Progress Notes (Signed)
Patient here for follow up Requesting something to stimulate her appetite Also lactose intolerant and would like a supplement prescribed

## 2013-10-12 NOTE — Patient Instructions (Signed)
DASH Diet  The DASH diet stands for "Dietary Approaches to Stop Hypertension." It is a healthy eating plan that has been shown to reduce high blood pressure (hypertension) in as little as 14 days, while also possibly providing other significant health benefits. These other health benefits include reducing the risk of breast cancer after menopause and reducing the risk of type 2 diabetes, heart disease, colon cancer, and stroke. Health benefits also include weight loss and slowing kidney failure in patients with chronic kidney disease.   DIET GUIDELINES  · Limit salt (sodium). Your diet should contain less than 1500 mg of sodium daily.  · Limit refined or processed carbohydrates. Your diet should include mostly whole grains. Desserts and added sugars should be used sparingly.  · Include small amounts of heart-healthy fats. These types of fats include nuts, oils, and tub margarine. Limit saturated and trans fats. These fats have been shown to be harmful in the body.  CHOOSING FOODS   The following food groups are based on a 2000 calorie diet. See your Registered Dietitian for individual calorie needs.  Grains and Grain Products (6 to 8 servings daily)  · Eat More Often: Whole-wheat bread, brown rice, whole-grain or wheat pasta, quinoa, popcorn without added fat or salt (air popped).  · Eat Less Often: White bread, white pasta, white rice, cornbread.  Vegetables (4 to 5 servings daily)  · Eat More Often: Fresh, frozen, and canned vegetables. Vegetables may be raw, steamed, roasted, or grilled with a minimal amount of fat.  · Eat Less Often/Avoid: Creamed or fried vegetables. Vegetables in a cheese sauce.  Fruit (4 to 5 servings daily)  · Eat More Often: All fresh, canned (in natural juice), or frozen fruits. Dried fruits without added sugar. One hundred percent fruit juice (½ cup [237 mL] daily).  · Eat Less Often: Dried fruits with added sugar. Canned fruit in light or heavy syrup.  Lean Meats, Fish, and Poultry (2  servings or less daily. One serving is 3 to 4 oz [85-114 g]).  · Eat More Often: Ninety percent or leaner ground beef, tenderloin, sirloin. Round cuts of beef, chicken breast, turkey breast. All fish. Grill, bake, or broil your meat. Nothing should be fried.  · Eat Less Often/Avoid: Fatty cuts of meat, turkey, or chicken leg, thigh, or wing. Fried cuts of meat or fish.  Dairy (2 to 3 servings)  · Eat More Often: Low-fat or fat-free milk, low-fat plain or light yogurt, reduced-fat or part-skim cheese.  · Eat Less Often/Avoid: Milk (whole, 2%). Whole milk yogurt. Full-fat cheeses.  Nuts, Seeds, and Legumes (4 to 5 servings per week)  · Eat More Often: All without added salt.  · Eat Less Often/Avoid: Salted nuts and seeds, canned beans with added salt.  Fats and Sweets (limited)  · Eat More Often: Vegetable oils, tub margarines without trans fats, sugar-free gelatin. Mayonnaise and salad dressings.  · Eat Less Often/Avoid: Coconut oils, palm oils, butter, stick margarine, cream, half and half, cookies, candy, pie.  FOR MORE INFORMATION  The Dash Diet Eating Plan: www.dashdiet.org  Document Released: 04/30/2011 Document Revised: 08/03/2011 Document Reviewed: 04/30/2011  ExitCare® Patient Information ©2014 [employer].

## 2013-10-12 NOTE — Progress Notes (Signed)
MRN: [id number] Name: Kristy Oliver  Sex: female Age: 68 y.o. DOB: [birth year]  Allergies: Iodinated diagnostic agents; Motrin; and Shrimp  Chief Complaint  Patient presents with  . Follow-up    HPI: Patient is 68 y.o. female who has to of hypertension, reported arthritis, gout comes today for followup, initially her blood pressure was high, repeat manual blood pressure is 120/70 she is taking lisinopril 20 mg denies any headache dizziness chest and shortness of breath, she reported to have lot of bloating symptoms denies any change in bowel habits but does report intolerance to milk, she never had a colonoscopy done complaining of early satiety and bloating, denies any nausea vomiting. Patient is also following up with rheumatologist and has scheduled appointment next month.  Past Medical History  Diagnosis Date  . Hypertension   . Arthritis   . Renal disorder   . Gout     Past Surgical History  Procedure Laterality Date  . Breast lumpectomy    . Joint replacement      left knee & left shoulder  . Left breast cyst removal    . C-section x 2        Medication List       This list is accurate as of: 10/12/13 11:22 AM.  Always use your most recent med list.               colchicine 0.6 MG tablet  Take 1 tablet (0.6 mg total) by mouth daily.     dronabinol 2.5 MG capsule  Commonly known as:  MARINOL  Take 1 capsule (2.5 mg total) by mouth 2 (two) times daily before lunch and supper.     lisinopril 20 MG tablet  Commonly known as:  PRINIVIL,ZESTRIL  Take 1 tablet (20 mg total) by mouth daily.     simethicone 80 MG chewable tablet  Commonly known as:  GAS-X  Chew 1 tablet (80 mg total) by mouth every 6 (six) hours as needed (bloating symptoms).        Meds ordered this encounter  Medications  . simethicone (GAS-X) 80 MG chewable tablet    Sig: Chew 1 tablet (80 mg total) by mouth every 6 (six) hours as needed (bloating symptoms).    Dispense:  30 tablet     Refill:  1    Immunization History  Administered Date(s) Administered  . Pneumococcal Polysaccharide-23 07/01/2013    History reviewed. No pertinent family history.  History  Substance Use Topics  . Smoking status: Current Every Day Smoker -- 0.50 packs/day    Types: Cigarettes  . Smokeless tobacco: Not on file  . Alcohol Use: 8.4 oz/week    14 Cans of beer per week     Comment: 2 cans of beer a day...last one 3 days PTA    Review of Systems   As noted in HPI  Filed Vitals:   10/12/13 1108  BP: 120/70  Pulse:   Temp:   Resp:     Physical Exam  Physical Exam  Constitutional: No distress.  Cardiovascular: Normal rate and regular rhythm.   Pulmonary/Chest: Breath sounds normal. No respiratory distress. She has no wheezes. She has no rales.  Abdominal: Soft. There is no tenderness. There is no rebound.    CBC    Component Value Date/Time   WBC 7.3 07/01/2013 0508   RBC 4.16 07/01/2013 0508   HGB 12.9 07/01/2013 0508   HCT 36.5 07/01/2013 0508   PLT 180 07/01/2013 0508  MCV 87.7 07/01/2013 0508   LYMPHSABS 1.6 06/30/2013 1210   MONOABS 0.4 06/30/2013 1210   EOSABS 0.1 06/30/2013 1210   BASOSABS 0.1 06/30/2013 1210    CMP     Component Value Date/Time   NA 140 07/03/2013 0651   K 3.8 07/03/2013 0651   CL 108 07/03/2013 0651   CO2 19 07/03/2013 0651   GLUCOSE 117* 07/03/2013 0651   BUN 7 07/03/2013 0651   CREATININE 0.68 07/03/2013 0651   CREATININE 0.72 12/26/2012 1317   CALCIUM 8.4 07/03/2013 0651   PROT 8.6* 06/30/2013 1210   ALBUMIN 3.6 06/30/2013 1210   AST 36 06/30/2013 1210   ALT 22 06/30/2013 1210   ALKPHOS 161* 06/30/2013 1210   BILITOT 0.3 06/30/2013 1210   GFRNONAA 90* 07/03/2013 0651   GFRAA >90 07/03/2013 0651    No results found for this basename: chol,  tri,  ldl    No components found with this basename: hga1c    Lab Results  Component Value Date/Time   AST 36 06/30/2013 12:10 PM    Assessment and Plan  HTN (hypertension) - Plan: Will check her blood chemistry  continue with lisinopril 20 mg COMPLETE METABOLIC PANEL WITH GFR  Rheumatoid arthritis Following her with rheumatologist.  Special screening for malignant neoplasms, colon - Plan: Ambulatory referral to Gastroenterology  Bloating symptom - Plan: simethicone (GAS-X) 80 MG chewable tablet, Ambulatory referral to Gastroenterology  Smoker Advise patient to quit smoking. Health Maintenance -Colonoscopy: referred to GI   Return in about 3 months (around 01/12/2014) for hypertension.  Lorayne Marek, MD

## 2013-10-17 ENCOUNTER — Telehealth: Payer: Self-pay | Admitting: *Deleted

## 2013-10-17 NOTE — Telephone Encounter (Signed)
Message copied by Joan Mayans on Tue Oct 17, 2013 12:18 PM ------      Message from: Lorayne Marek      Created: Tue Oct 17, 2013 10:49 AM       Blood work reviewed noticed impaired fasting glucose, call and advise patient for low carbohydrate diet.       ------

## 2013-10-17 NOTE — Telephone Encounter (Signed)
Pt is aware of her lab results.  

## 2013-10-18 ENCOUNTER — Telehealth: Payer: Self-pay

## 2013-10-18 NOTE — Telephone Encounter (Signed)
Message copied by Dorothe Pea on Wed Oct 18, 2013  2:31 PM ------      Message from: Lorayne Marek      Created: Tue Oct 17, 2013 10:49 AM       Blood work reviewed noticed impaired fasting glucose, call and advise patient for low carbohydrate diet.       ------

## 2013-10-18 NOTE — Telephone Encounter (Signed)
Spoke with grandson about her lab results He is guardian for his grandmother He we let her know her lab results

## 2013-10-30 ENCOUNTER — Ambulatory Visit: Payer: Medicare Other | Admitting: Internal Medicine

## 2013-11-20 ENCOUNTER — Encounter: Payer: Self-pay | Admitting: Internal Medicine

## 2014-02-13 ENCOUNTER — Ambulatory Visit: Payer: Medicare Other | Admitting: Internal Medicine

## 2014-03-16 ENCOUNTER — Ambulatory Visit: Payer: Medicare Other | Admitting: Internal Medicine

## 2014-03-19 ENCOUNTER — Ambulatory Visit: Payer: Medicare Other | Admitting: Internal Medicine

## 2014-03-20 ENCOUNTER — Ambulatory Visit: Payer: Medicare Other | Admitting: Internal Medicine

## 2014-03-26 ENCOUNTER — Ambulatory Visit: Payer: Medicare Other | Admitting: Internal Medicine

## 2014-04-29 IMAGING — CR DG CHEST 2V
2 series · 2 of 2 positions shown · non-contrast
Comparison: None.

CLINICAL DATA: Hypertension

CHEST - 2 VIEW

[w chest pa]
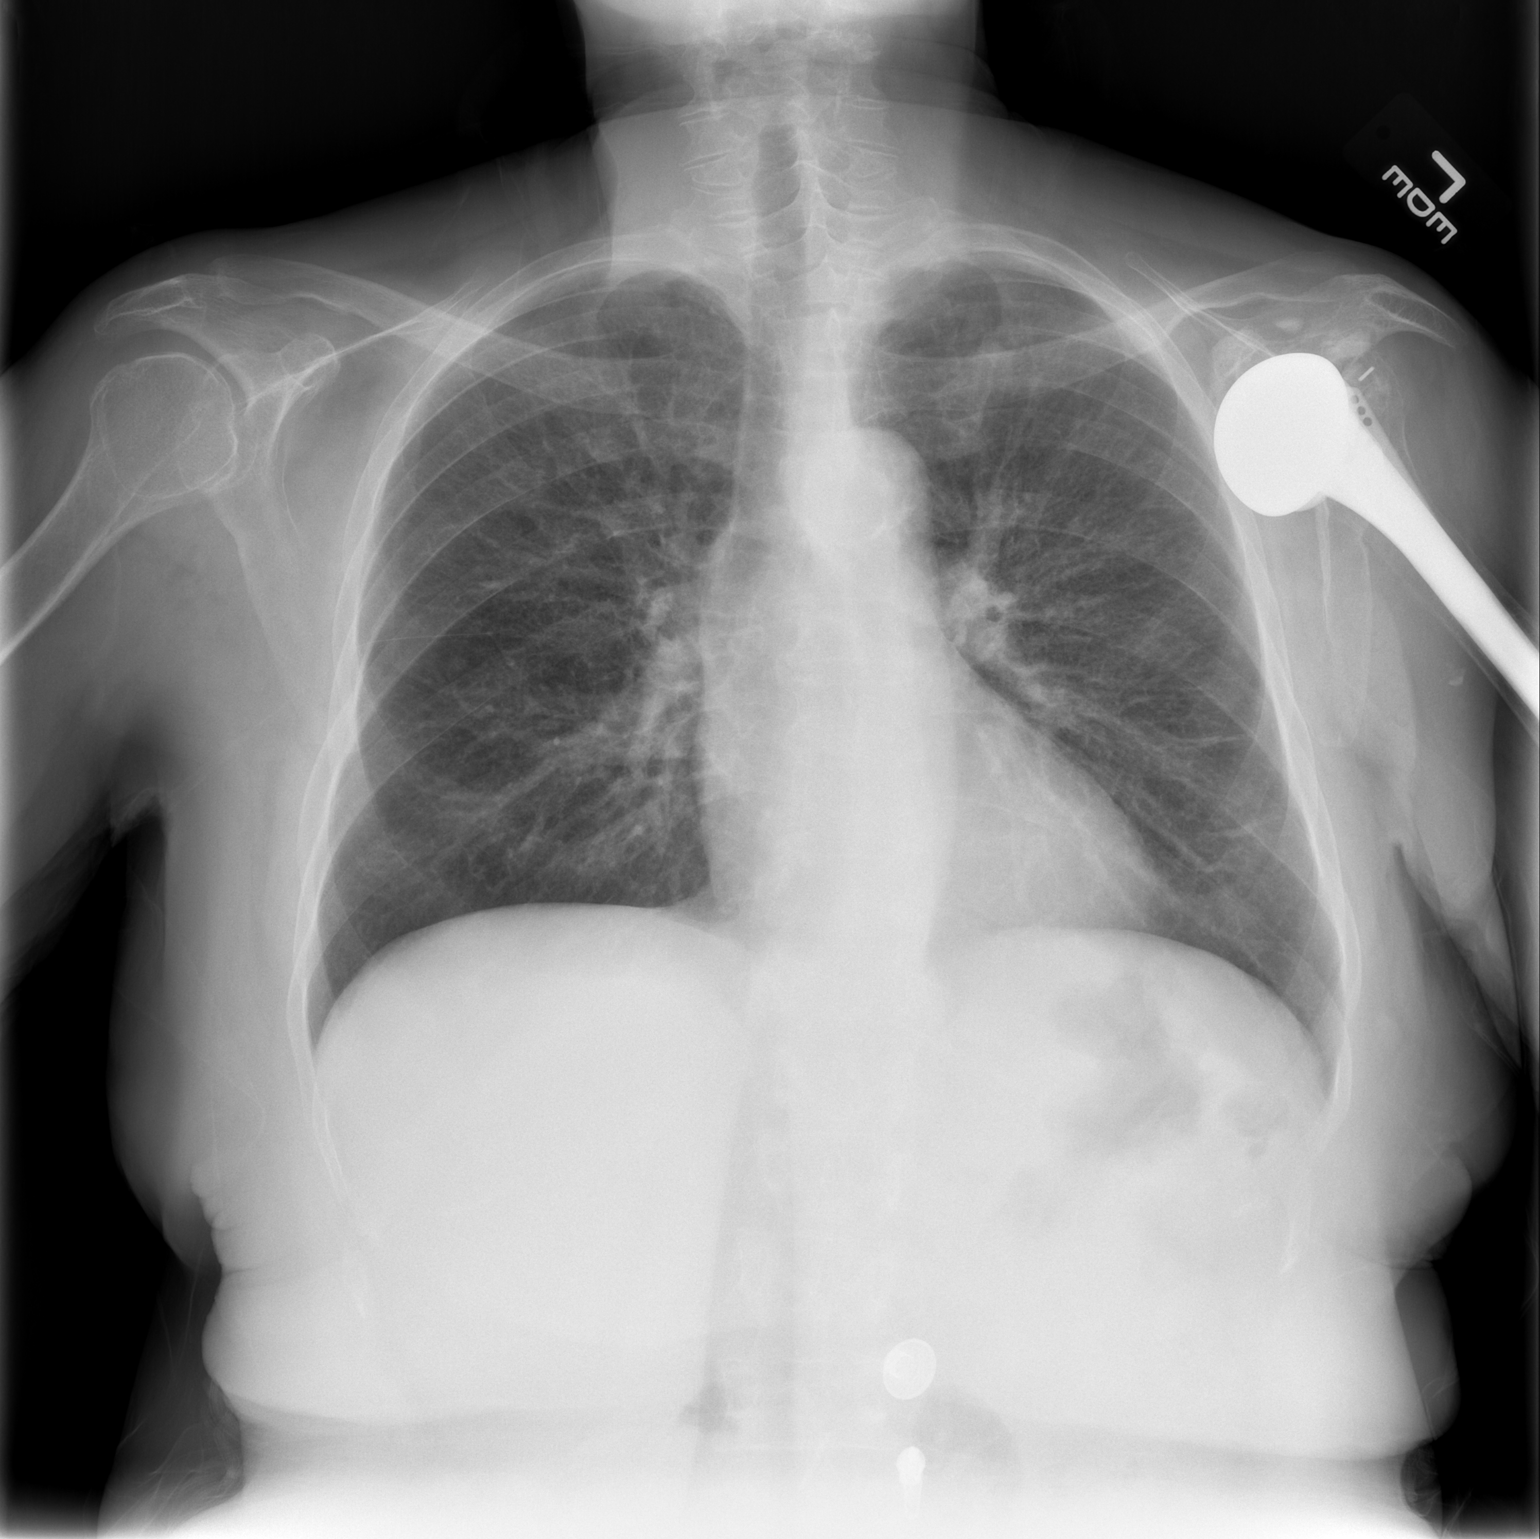

[w chest lat]
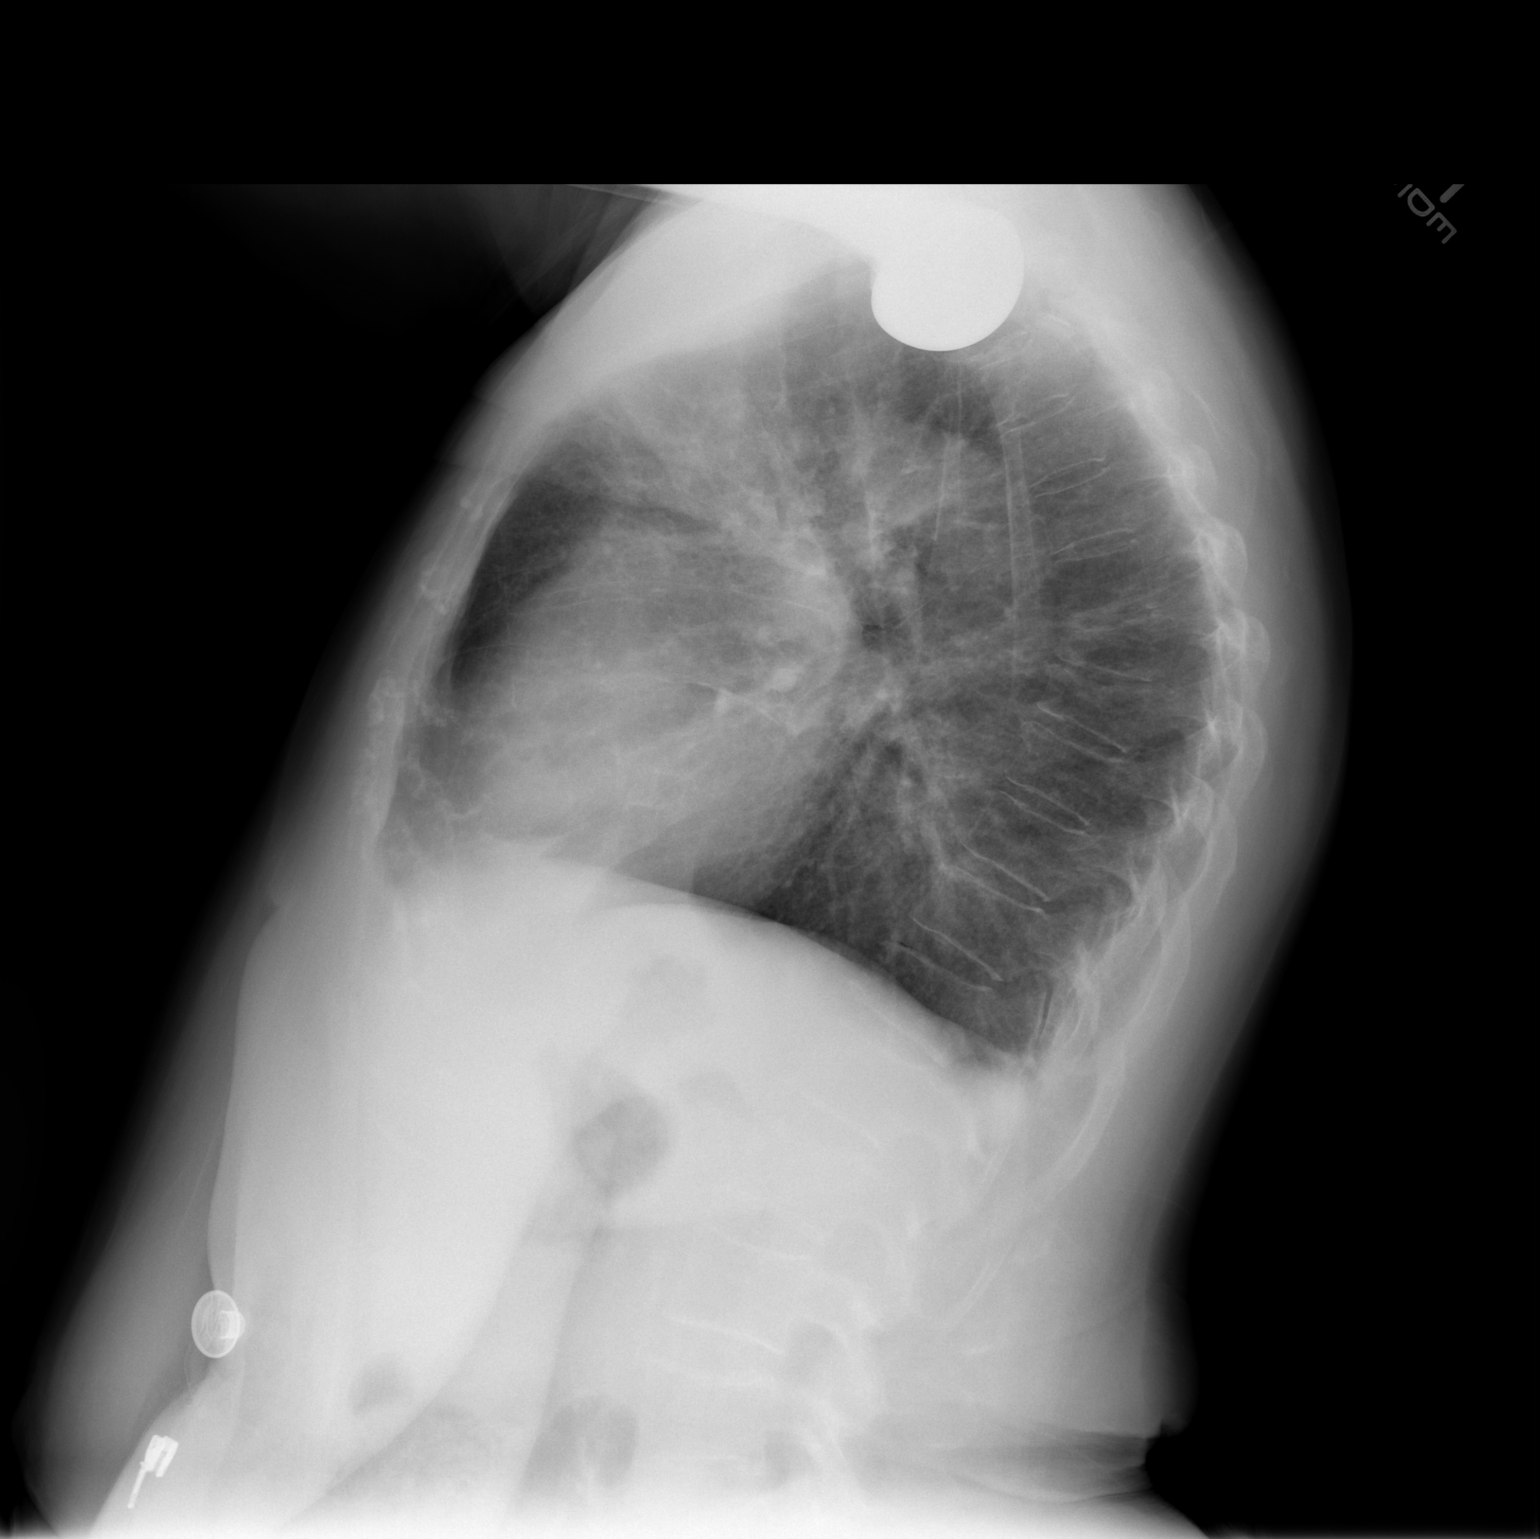

[2 of 2 positions shown; findings below may reference images not displayed]

FINDINGS: Cardiomediastinal silhouette appears normal.  Status post
left total shoulder arthroplasty.  No pneumothorax or pleural
effusion is noted.  Linear densities are noted in left lung base
most consistent with scarring.  No acute pulmonary disease is
noted.  Moderate wedge compression deformity of mid thoracic
vertebral body is noted consistent with old fracture.
IMPRESSION: No acute cardiopulmonary abnormality seen.

## 2014-05-11 ENCOUNTER — Ambulatory Visit: Payer: Medicare Other | Admitting: Internal Medicine

## 2014-05-11 ENCOUNTER — Telehealth: Payer: Self-pay | Admitting: Internal Medicine

## 2014-05-11 NOTE — Telephone Encounter (Signed)
Patient has come in today to request medication refill for Colcrys 0.6mg  Tablets and Lisinopril 20 mg Tablets; please f/u with patient about refill request

## 2014-05-15 ENCOUNTER — Telehealth: Payer: Self-pay | Admitting: Emergency Medicine

## 2014-05-15 MED ORDER — LISINOPRIL 20 MG PO TABS: 20.0000 mg | ORAL_TABLET | Freq: Every day | ORAL | Status: DC

## 2014-05-15 NOTE — Telephone Encounter (Signed)
Left message for pt that we can refill Lisinopril 20 mg tab as a one time courtesy until seen by provider Medication e-scribed to [place]

## 2014-06-01 ENCOUNTER — Ambulatory Visit: Payer: Medicare Other | Attending: Internal Medicine | Admitting: Internal Medicine

## 2014-06-01 ENCOUNTER — Ambulatory Visit (HOSPITAL_BASED_OUTPATIENT_CLINIC_OR_DEPARTMENT_OTHER): Payer: Medicare Other

## 2014-06-01 ENCOUNTER — Encounter: Payer: Self-pay | Admitting: Internal Medicine

## 2014-06-01 VITALS — BP 157/89 | HR 82 | Temp 98.0°F | Resp 16 | Wt 136.0 lb

## 2014-06-01 DIAGNOSIS — F17200 Nicotine dependence, unspecified, uncomplicated: Secondary | ICD-10-CM

## 2014-06-01 DIAGNOSIS — Z8639 Personal history of other endocrine, nutritional and metabolic disease: Secondary | ICD-10-CM | POA: Diagnosis not present

## 2014-06-01 DIAGNOSIS — M069 Rheumatoid arthritis, unspecified: Secondary | ICD-10-CM

## 2014-06-01 DIAGNOSIS — Z720 Tobacco use: Secondary | ICD-10-CM

## 2014-06-01 DIAGNOSIS — M109 Gout, unspecified: Secondary | ICD-10-CM | POA: Diagnosis not present

## 2014-06-01 DIAGNOSIS — I10 Essential (primary) hypertension: Secondary | ICD-10-CM

## 2014-06-01 DIAGNOSIS — M2550 Pain in unspecified joint: Secondary | ICD-10-CM

## 2014-06-01 DIAGNOSIS — Z8739 Personal history of other diseases of the musculoskeletal system and connective tissue: Secondary | ICD-10-CM

## 2014-06-01 DIAGNOSIS — F17210 Nicotine dependence, cigarettes, uncomplicated: Secondary | ICD-10-CM | POA: Insufficient documentation

## 2014-06-01 DIAGNOSIS — Z23 Encounter for immunization: Secondary | ICD-10-CM | POA: Diagnosis not present

## 2014-06-01 LAB — COMPLETE METABOLIC PANEL WITH GFR
ALBUMIN: 3.5 g/dL (ref 3.5–5.2)
ALK PHOS: 220 U/L — AB (ref 39–117)
ALT: 29 U/L (ref 0–35)
AST: 40 U/L — AB (ref 0–37)
BILIRUBIN TOTAL: 0.3 mg/dL (ref 0.2–1.2)
BUN: 12 mg/dL (ref 6–23)
CHLORIDE: 102 meq/L (ref 96–112)
CO2: 27 meq/L (ref 19–32)
Calcium: 10.3 mg/dL (ref 8.4–10.5)
Creat: 0.73 mg/dL (ref 0.50–1.10)
GFR, Est Non African American: 86 mL/min
Glucose, Bld: 83 mg/dL (ref 70–99)
POTASSIUM: 5 meq/L (ref 3.5–5.3)
Sodium: 138 mEq/L (ref 135–145)
TOTAL PROTEIN: 8.9 g/dL — AB (ref 6.0–8.3)

## 2014-06-01 LAB — URIC ACID: URIC ACID, SERUM: 6.6 mg/dL (ref 2.4–7.0)

## 2014-06-01 LAB — RHEUMATOID FACTOR: RHEUMATOID FACTOR: 121 [IU]/mL — AB (ref <=14)

## 2014-06-01 MED ORDER — METHYLPREDNISOLONE 4 MG PO KIT: PACK | ORAL | Status: DC

## 2014-06-01 MED ORDER — LISINOPRIL 30 MG PO TABS: 30.0000 mg | ORAL_TABLET | Freq: Every day | ORAL | Status: DC

## 2014-06-01 MED ORDER — COLCHICINE 0.6 MG PO TABS: 0.6000 mg | ORAL_TABLET | Freq: Every day | ORAL | Status: DC

## 2014-06-01 NOTE — Progress Notes (Signed)
Patient complains of bilateral wrist pain and knee pain Having some pain to her left shoulder that feels like it is being pinched Concerned about some curvature of her spine

## 2014-06-01 NOTE — Progress Notes (Signed)
MRN: [id number] Name: Kristy Oliver  Sex: female Age: 69 y.o. DOB: 01/17/1946  Allergies: Iodinated diagnostic agents; Motrin; and Shrimp  Chief Complaint  Patient presents with  . Wrist Pain    HPI: Patient is 69 y.o. female who history of hypertension, gout, rheumatoid arthritis, comes today for followup her blood pressure has been elevated, denies any headache dizziness chest and shortness of breath currently she is taking lisinopril 20 mg daily, she does complain of multiple joint pain, as per patient she was seen the rheumatologist in [place] but has lost follow ?secondary to insurance issues, she's requesting refill on her medications. Patient still smoke cigarettes, I have advised patient to quit smoking  Past Medical History  Diagnosis Date  . Hypertension   . Arthritis   . Renal disorder   . Gout     Past Surgical History  Procedure Laterality Date  . Breast lumpectomy    . Joint replacement      left knee & left shoulder  . Left breast cyst removal    . C-section x 2        Medication List       This list is accurate as of: 06/01/14  3:29 PM.  Always use your most recent med list.               colchicine 0.6 MG tablet  Take 1 tablet (0.6 mg total) by mouth daily.     dronabinol 2.5 MG capsule  Commonly known as:  MARINOL  Take 1 capsule (2.5 mg total) by mouth 2 (two) times daily before lunch and supper.     lisinopril 30 MG tablet  Commonly known as:  PRINIVIL,ZESTRIL  Take 1 tablet (30 mg total) by mouth daily.     methylPREDNISolone 4 MG tablet  Commonly known as:  MEDROL DOSEPAK  follow package directions     simethicone 80 MG chewable tablet  Commonly known as:  GAS-X  Chew 1 tablet (80 mg total) by mouth every 6 (six) hours as needed (bloating symptoms).        Meds ordered this encounter  Medications  . lisinopril (PRINIVIL,ZESTRIL) 30 MG tablet    Sig: Take 1 tablet (30 mg total) by mouth daily.    Dispense:  30 tablet      Refill:  3  . methylPREDNISolone (MEDROL DOSEPAK) 4 MG tablet    Sig: follow package directions    Dispense:  21 tablet    Refill:  0  . colchicine 0.6 MG tablet    Sig: Take 1 tablet (0.6 mg total) by mouth daily.    Dispense:  30 tablet    Refill:  3    Immunization History  Administered Date(s) Administered  . Influenza,inj,Quad PF,36+ Mos 06/01/2014  . Pneumococcal Polysaccharide-23 07/01/2013    History reviewed. No pertinent family history.  History  Substance Use Topics  . Smoking status: Current Every Day Smoker -- 0.50 packs/day    Types: Cigarettes  . Smokeless tobacco: Not on file  . Alcohol Use: 8.4 oz/week    14 Cans of beer per week     Comment: 2 cans of beer a day...last one 3 days PTA    Review of Systems   As noted in HPI  Filed Vitals:   06/01/14 1434  BP: 157/89  Pulse: 82  Temp: 98 F (36.7 C)  Resp: 16    Physical Exam  Physical Exam  Eyes: EOM are normal. Pupils are equal, round,  and reactive to light.  Cardiovascular: Normal rate and regular rhythm.   Pulmonary/Chest: Breath sounds normal. No respiratory distress. She has no wheezes. She has no rales.  Musculoskeletal:  Bilateral hand MCP joint tenderness, elbows/tophus     CBC    Component Value Date/Time   WBC 7.3 07/01/2013 0508   RBC 4.16 07/01/2013 0508   HGB 12.9 07/01/2013 0508   HCT 36.5 07/01/2013 0508   PLT 180 07/01/2013 0508   MCV 87.7 07/01/2013 0508   LYMPHSABS 1.6 06/30/2013 1210   MONOABS 0.4 06/30/2013 1210   EOSABS 0.1 06/30/2013 1210   BASOSABS 0.1 06/30/2013 1210    CMP     Component Value Date/Time   NA 135 10/12/2013 1124   K 5.3 10/12/2013 1124   CL 106 10/12/2013 1124   CO2 23 10/12/2013 1124   GLUCOSE 119* 10/12/2013 1124   BUN 33* 10/12/2013 1124   CREATININE 1.06 10/12/2013 1124   CREATININE 0.68 07/03/2013 0651   CALCIUM 9.7 10/12/2013 1124   PROT 8.1 10/12/2013 1124   ALBUMIN 3.5 10/12/2013 1124   AST 23 10/12/2013 1124   ALT 14  10/12/2013 1124   ALKPHOS 153* 10/12/2013 1124   BILITOT 0.2 10/12/2013 1124   GFRNONAA 55* 10/12/2013 1124   GFRNONAA 90* 07/03/2013 0651   GFRAA 64 10/12/2013 1124   GFRAA >90 07/03/2013 0651    No results found for: CHOL  No components found for: HGA1C  Lab Results  Component Value Date/Time   AST 23 10/12/2013 11:24 AM    Assessment and Plan  Essential hypertension - Plan:advised patient for DASH diet, also increased the dose of lisinopril  lisinopril (PRINIVIL,ZESTRIL) 30 MG tablet, COMPLETE METABOLIC PANEL WITH GFR  Rheumatoid arthritis - Plan: Ambulatory referral to Rheumatology, Rheumatoid factor  Smoker Advised patient to quit smoking.  Joint pain - Plan: methylPREDNISolone (MEDROL DOSEPAK) 4 MG tablet, Ambulatory referral to Rheumatology  History of gout - Plan: Ambulatory referral to Rheumatology, will check Uric Acid, patient takes colchicine   Needs flu shot Flu shot given today.  Health Maintenance  -Vaccinations:  Flu shot given today, up-to-date with Pneumovax  Return in about 3 months (around 08/31/2014) for hypertension.  Lorayne Marek, MD

## 2014-06-04 ENCOUNTER — Telehealth: Payer: Self-pay | Admitting: *Deleted

## 2014-06-04 NOTE — Telephone Encounter (Signed)
-----   Message from Lorayne Marek, MD sent at 06/04/2014  9:21 AM EST ----- Call and let the patient know that high uric acid level is in normal range. Rheumatoid factor is elevated patient  has history of rheumatoid arthritis, she  has already been referred to rheumatologist , help patient to schedule appointment with specialist.

## 2014-06-04 NOTE — Telephone Encounter (Signed)
Left voice message to return call 

## 2014-06-07 ENCOUNTER — Telehealth: Payer: Self-pay | Admitting: Internal Medicine

## 2014-06-07 NOTE — Telephone Encounter (Signed)
Pt said she received a call today from someone at her granddaughters phone number. Please follow up with pt.

## 2014-07-02 DIAGNOSIS — M25431 Effusion, right wrist: Secondary | ICD-10-CM | POA: Diagnosis not present

## 2014-07-02 DIAGNOSIS — M25474 Effusion, right foot: Secondary | ICD-10-CM | POA: Diagnosis not present

## 2014-07-02 DIAGNOSIS — R799 Abnormal finding of blood chemistry, unspecified: Secondary | ICD-10-CM | POA: Diagnosis not present

## 2014-07-02 DIAGNOSIS — M25441 Effusion, right hand: Secondary | ICD-10-CM | POA: Diagnosis not present

## 2014-07-02 DIAGNOSIS — M25462 Effusion, left knee: Secondary | ICD-10-CM | POA: Diagnosis not present

## 2014-07-02 DIAGNOSIS — M25432 Effusion, left wrist: Secondary | ICD-10-CM | POA: Diagnosis not present

## 2014-07-02 DIAGNOSIS — M25442 Effusion, left hand: Secondary | ICD-10-CM | POA: Diagnosis not present

## 2014-07-02 DIAGNOSIS — M1990 Unspecified osteoarthritis, unspecified site: Secondary | ICD-10-CM | POA: Diagnosis not present

## 2014-07-18 DIAGNOSIS — M25431 Effusion, right wrist: Secondary | ICD-10-CM | POA: Diagnosis not present

## 2014-07-18 DIAGNOSIS — M25432 Effusion, left wrist: Secondary | ICD-10-CM | POA: Diagnosis not present

## 2014-07-18 DIAGNOSIS — R799 Abnormal finding of blood chemistry, unspecified: Secondary | ICD-10-CM | POA: Diagnosis not present

## 2014-07-18 DIAGNOSIS — M25442 Effusion, left hand: Secondary | ICD-10-CM | POA: Diagnosis not present

## 2014-07-18 DIAGNOSIS — M25474 Effusion, right foot: Secondary | ICD-10-CM | POA: Diagnosis not present

## 2014-07-18 DIAGNOSIS — M25475 Effusion, left foot: Secondary | ICD-10-CM | POA: Diagnosis not present

## 2014-07-18 DIAGNOSIS — M25462 Effusion, left knee: Secondary | ICD-10-CM | POA: Diagnosis not present

## 2014-07-18 DIAGNOSIS — M25441 Effusion, right hand: Secondary | ICD-10-CM | POA: Diagnosis not present

## 2014-07-18 DIAGNOSIS — M1990 Unspecified osteoarthritis, unspecified site: Secondary | ICD-10-CM | POA: Diagnosis not present

## 2014-08-07 DIAGNOSIS — M25432 Effusion, left wrist: Secondary | ICD-10-CM | POA: Diagnosis not present

## 2014-08-07 DIAGNOSIS — M25512 Pain in left shoulder: Secondary | ICD-10-CM | POA: Diagnosis not present

## 2014-08-07 DIAGNOSIS — M25562 Pain in left knee: Secondary | ICD-10-CM | POA: Diagnosis not present

## 2014-08-07 DIAGNOSIS — M25431 Effusion, right wrist: Secondary | ICD-10-CM | POA: Diagnosis not present

## 2014-08-07 DIAGNOSIS — M25571 Pain in right ankle and joints of right foot: Secondary | ICD-10-CM | POA: Diagnosis not present

## 2014-08-07 DIAGNOSIS — M130 Polyarthritis, unspecified: Secondary | ICD-10-CM | POA: Diagnosis not present

## 2014-08-31 ENCOUNTER — Telehealth: Payer: Self-pay | Admitting: Emergency Medicine

## 2014-08-31 NOTE — Telephone Encounter (Signed)
Patient called and stated that she has had numerous calls to her home within the past week and she does not know what the calls are about. I told her there are no phone encounters, but I transferred her call to [place].

## 2014-10-08 DIAGNOSIS — Z79899 Other long term (current) drug therapy: Secondary | ICD-10-CM | POA: Diagnosis not present

## 2014-10-08 DIAGNOSIS — M25442 Effusion, left hand: Secondary | ICD-10-CM | POA: Diagnosis not present

## 2014-10-08 DIAGNOSIS — M25461 Effusion, right knee: Secondary | ICD-10-CM | POA: Diagnosis not present

## 2014-10-08 DIAGNOSIS — M059 Rheumatoid arthritis with rheumatoid factor, unspecified: Secondary | ICD-10-CM | POA: Diagnosis not present

## 2014-10-08 DIAGNOSIS — M25462 Effusion, left knee: Secondary | ICD-10-CM | POA: Diagnosis not present

## 2014-10-08 DIAGNOSIS — M25441 Effusion, right hand: Secondary | ICD-10-CM | POA: Diagnosis not present

## 2014-11-12 DIAGNOSIS — M059 Rheumatoid arthritis with rheumatoid factor, unspecified: Secondary | ICD-10-CM | POA: Diagnosis not present

## 2014-12-03 DIAGNOSIS — Z79899 Other long term (current) drug therapy: Secondary | ICD-10-CM | POA: Diagnosis not present

## 2014-12-03 DIAGNOSIS — M059 Rheumatoid arthritis with rheumatoid factor, unspecified: Secondary | ICD-10-CM | POA: Diagnosis not present

## 2015-01-07 DIAGNOSIS — M25532 Pain in left wrist: Secondary | ICD-10-CM | POA: Diagnosis not present

## 2015-01-07 DIAGNOSIS — M25462 Effusion, left knee: Secondary | ICD-10-CM | POA: Diagnosis not present

## 2015-01-07 DIAGNOSIS — M25461 Effusion, right knee: Secondary | ICD-10-CM | POA: Diagnosis not present

## 2015-01-07 DIAGNOSIS — M059 Rheumatoid arthritis with rheumatoid factor, unspecified: Secondary | ICD-10-CM | POA: Diagnosis not present

## 2015-01-07 DIAGNOSIS — M25531 Pain in right wrist: Secondary | ICD-10-CM | POA: Diagnosis not present

## 2015-02-19 DIAGNOSIS — M069 Rheumatoid arthritis, unspecified: Secondary | ICD-10-CM | POA: Diagnosis not present

## 2015-02-19 DIAGNOSIS — Z79899 Other long term (current) drug therapy: Secondary | ICD-10-CM | POA: Diagnosis not present

## 2015-03-11 DIAGNOSIS — M25562 Pain in left knee: Secondary | ICD-10-CM | POA: Diagnosis not present

## 2015-03-11 DIAGNOSIS — M25531 Pain in right wrist: Secondary | ICD-10-CM | POA: Diagnosis not present

## 2015-03-11 DIAGNOSIS — Z79899 Other long term (current) drug therapy: Secondary | ICD-10-CM | POA: Diagnosis not present

## 2015-03-11 DIAGNOSIS — M25532 Pain in left wrist: Secondary | ICD-10-CM | POA: Diagnosis not present

## 2015-03-11 DIAGNOSIS — M0570 Rheumatoid arthritis with rheumatoid factor of unspecified site without organ or systems involvement: Secondary | ICD-10-CM | POA: Diagnosis not present

## 2015-03-11 DIAGNOSIS — J069 Acute upper respiratory infection, unspecified: Secondary | ICD-10-CM | POA: Diagnosis not present

## 2015-06-11 DIAGNOSIS — M25442 Effusion, left hand: Secondary | ICD-10-CM | POA: Diagnosis not present

## 2015-06-11 DIAGNOSIS — M25512 Pain in left shoulder: Secondary | ICD-10-CM | POA: Diagnosis not present

## 2015-06-11 DIAGNOSIS — Z79899 Other long term (current) drug therapy: Secondary | ICD-10-CM | POA: Diagnosis not present

## 2015-06-11 DIAGNOSIS — M25441 Effusion, right hand: Secondary | ICD-10-CM | POA: Diagnosis not present

## 2015-06-11 DIAGNOSIS — M25562 Pain in left knee: Secondary | ICD-10-CM | POA: Diagnosis not present

## 2015-06-11 DIAGNOSIS — M0570 Rheumatoid arthritis with rheumatoid factor of unspecified site without organ or systems involvement: Secondary | ICD-10-CM | POA: Diagnosis not present

## 2015-06-11 DIAGNOSIS — M25511 Pain in right shoulder: Secondary | ICD-10-CM | POA: Diagnosis not present

## 2015-06-24 ENCOUNTER — Other Ambulatory Visit: Payer: Self-pay | Admitting: Internal Medicine

## 2015-06-27 ENCOUNTER — Ambulatory Visit: Payer: Medicare Other | Attending: Family Medicine | Admitting: Family Medicine

## 2015-06-27 ENCOUNTER — Encounter: Payer: Self-pay | Admitting: Family Medicine

## 2015-06-27 DIAGNOSIS — M069 Rheumatoid arthritis, unspecified: Secondary | ICD-10-CM | POA: Insufficient documentation

## 2015-06-27 DIAGNOSIS — Z79899 Other long term (current) drug therapy: Secondary | ICD-10-CM | POA: Diagnosis not present

## 2015-06-27 DIAGNOSIS — J329 Chronic sinusitis, unspecified: Secondary | ICD-10-CM | POA: Insufficient documentation

## 2015-06-27 DIAGNOSIS — R0982 Postnasal drip: Secondary | ICD-10-CM

## 2015-06-27 DIAGNOSIS — R05 Cough: Secondary | ICD-10-CM | POA: Diagnosis not present

## 2015-06-27 DIAGNOSIS — Z760 Encounter for issue of repeat prescription: Secondary | ICD-10-CM | POA: Diagnosis not present

## 2015-06-27 DIAGNOSIS — I10 Essential (primary) hypertension: Secondary | ICD-10-CM | POA: Diagnosis not present

## 2015-06-27 DIAGNOSIS — M0579 Rheumatoid arthritis with rheumatoid factor of multiple sites without organ or systems involvement: Secondary | ICD-10-CM | POA: Diagnosis not present

## 2015-06-27 MED ORDER — CETIRIZINE HCL 10 MG PO TABS: 10.0000 mg | ORAL_TABLET | Freq: Every day | ORAL | Status: DC

## 2015-06-27 MED ORDER — LISINOPRIL 30 MG PO TABS: 30.0000 mg | ORAL_TABLET | Freq: Every day | ORAL | Status: DC

## 2015-06-27 NOTE — Progress Notes (Signed)
06/27/2015  ASSESSMENT: Pt currently experiencing Adjustment disorder with mixed anxiety and depressed mood, needs to f/u with PCP and [facility]; would benefit from psychoeducation and supportive counseling, as well as community resources for social support, regarding coping with symptoms of anxiety and depression.  Stage of Change: contemplative  PLAN: 1. F/U with behavioral health consultant in two weeks 2. Psychiatric Medications: none 3. Behavioral recommendation(s):   -Consider enrolling in [facility] program for seniors -Consider contacting [employer] for social support opportunities -Consider reading educational materials regarding coping with symptoms of anxiety and depression  SUBJECTIVE: Pt. referred by Dr Jarold Song for symptoms of anxiety and depression:  Pt. reports the following symptoms/concerns: Pt states that she has been feeling anxious and depressed since her grandchildren started going to school and she is in her home alone most of the day; she would feel better if she had more opportunities to spend time with people outside her home Duration of problem: At least two months Severity: moderate-severe  OBJECTIVE: Orientation & Cognition: Oriented x3. Thought processes normal and appropriate to situation. Mood: appropriate. Affect: appropriate Appearance: appropriate Risk of harm to self or others: no known risk of harm to self or others Substance use: alcohol, tobacco Assessments administered: PHQ9: 21/ GAD7: 13  Diagnosis: Adjustment disorder with mixed anxiety and depressed mood CPT Code: F43.23 -------------------------------------------- Other(s) present in the room: grown grandson  Time spent with patient in exam room: 20 minutes

## 2015-06-27 NOTE — Progress Notes (Signed)
Patient here for medication refills.  Patient reports not feeling her best but pretty good. Patient feels weak and her bones hurt. Patient has back pain due to curve in back. Patient feels left shoulder is out of socket, had shoulder replacement about 20 years ago.  Patient reports pain in left knee, at level 7, described as constant, throb.  Patient reports bilateral wrist pain, at level 8, described as cracking. Patient reports left shoulder, at level 7, described as throbbing.  Patient has arthritis doctor, Dr. Charlestine Night.  Patient went 3-4 days without lisinopril and took last dose of lisinopril yesterday because she found 1 dose.  Patient takes methotrexate, unsure of dosage.

## 2015-06-27 NOTE — Progress Notes (Signed)
Subjective:  Patient ID: Kristy Oliver, female    DOB: 12/01/1945  Age: 70 y.o. MRN: [id number]  CC: Medication Refill   HPI Kristy Oliver is a 70 year old female with a history of rheumatoid arthritis, hypertension who comes into the clinic for follow-up visit.  She has been out of her lisinopril for 3 days but has been taking another antihypertensive which she found at home. Her rheumatoid arthritis is managed by her rheumatologist- Dr Charlestine Night. She complains of pain in her wrist joints, elbow joints and shoulder joints, lower back and has been compliant with her methotrexate. She is status post left shoulder replacement Marinol appears in her med list but she states she is not taking this.  She also complains of postnasal drip and having to cough a lot with production of clear sputum but denies any sinus pressure or sinus pain.    Outpatient Prescriptions Prior to Visit  Medication Sig Dispense Refill  . methylPREDNISolone (MEDROL DOSEPAK) 4 MG tablet follow package directions 21 tablet 0  . lisinopril (PRINIVIL,ZESTRIL) 30 MG tablet Take 1 tablet (30 mg total) by mouth daily. 30 tablet 3  . colchicine 0.6 MG tablet Take 1 tablet (0.6 mg total) by mouth daily. (Patient not taking: Reported on 06/27/2015) 30 tablet 3  . dronabinol (MARINOL) 2.5 MG capsule Take 1 capsule (2.5 mg total) by mouth 2 (two) times daily before lunch and supper. (Patient not taking: Reported on 06/27/2015) 60 capsule 3  . simethicone (GAS-X) 80 MG chewable tablet Chew 1 tablet (80 mg total) by mouth every 6 (six) hours as needed (bloating symptoms). (Patient not taking: Reported on 06/27/2015) 30 tablet 1   No facility-administered medications prior to visit.    ROS Review of Systems  Constitutional: Negative for activity change, appetite change and fatigue.  HENT: Positive for postnasal drip. Negative for congestion, sinus pressure and sore throat.   Eyes: Negative for visual disturbance.  Respiratory:  Positive for cough. Negative for chest tightness, shortness of breath and wheezing.   Cardiovascular: Negative for chest pain and palpitations.  Gastrointestinal: Negative for abdominal pain, constipation and abdominal distention.  Endocrine: Negative for polydipsia.  Genitourinary: Negative for dysuria and frequency.  Musculoskeletal:       See history of present illness  Skin: Negative for rash.  Neurological: Negative for tremors, light-headedness and numbness.  Hematological: Does not bruise/bleed easily.  Psychiatric/Behavioral: Negative for behavioral problems and agitation.    Objective:  BP 122/80 mmHg  Pulse 75  Temp(Src) 98.2 F (36.8 C) (Oral)  Resp 18  Ht 5\' 2"  (1.575 m)  Wt 150 lb (68.04 kg)  BMI 27.43 kg/m2  SpO2 96%  BP/Weight 06/27/2015 06/01/2014 [id number]  Systolic BP 123XX123 [id number]  Diastolic BP 80 89 70  Wt. (Lbs) 150 136 130  BMI 27.43 24.87 23.77      Physical Exam  Constitutional: She is oriented to person, place, and time. She appears well-developed and well-nourished.  Cardiovascular: Normal rate, normal heart sounds and intact distal pulses.   No murmur heard. Pulmonary/Chest: Effort normal and breath sounds normal. She has no wheezes. She has no rales. She exhibits no tenderness.  Abdominal: Soft. Bowel sounds are normal. She exhibits no distension and no mass. There is no tenderness.  Musculoskeletal:  Multiple rheumatoid nodules in hands and elbows.  Neurological: She is alert and oriented to person, place, and time.     Assessment & Plan:  1. Essential hypertension  Controlled. - lisinopril (PRINIVIL,ZESTRIL) 30 MG tablet; Take  1 tablet (30 mg total) by mouth daily.  Dispense: 30 tablet; Refill: 3 - COMPLETE METABOLIC PANEL WITH GFR; Future - Lipid panel; Future  2. Rheumatoid arthritis involving multiple sites with positive rheumatoid factor (HCC) Current multiple joint pain secondary to rheumatoid. Continue methotrexate 12.5 mg on  Wednesdays and Thursdays every week. Patient advised to keep appointment with rheumatologist. She has been advised to obtain pain medications from her rheumatologist and if she is unable to I will place on tramadol.  3. Post-nasal drip We'll place an antihistamine. - cetirizine (ZYRTEC) 10 MG tablet; Take 1 tablet (10 mg total) by mouth daily.  Dispense: 30 tablet; Refill: 3   Meds ordered this encounter  Medications  . lisinopril (PRINIVIL,ZESTRIL) 30 MG tablet    Sig: Take 1 tablet (30 mg total) by mouth daily.    Dispense:  30 tablet    Refill:  3  . cetirizine (ZYRTEC) 10 MG tablet    Sig: Take 1 tablet (10 mg total) by mouth daily.    Dispense:  30 tablet    Refill:  3    Follow-up: Return in about 3 months (around 09/24/2015) for Follow-up of hypertension.   Arnoldo Morale MD

## 2015-06-27 NOTE — Patient Instructions (Signed)
Hypertension Hypertension, commonly called high blood pressure, is when the force of blood pumping through your arteries is too strong. Your arteries are the blood vessels that carry blood from your heart throughout your body. A blood pressure reading consists of a higher number over a lower number, such as 110/72. The higher number (systolic) is the pressure inside your arteries when your heart pumps. The lower number (diastolic) is the pressure inside your arteries when your heart relaxes. Ideally you want your blood pressure below 120/80. Hypertension forces your heart to work harder to pump blood. Your arteries may become narrow or stiff. Having untreated or uncontrolled hypertension can cause heart attack, stroke, kidney disease, and other problems. RISK FACTORS Some risk factors for high blood pressure are controllable. Others are not.  Risk factors you cannot control include:   Race. You may be at higher risk if you are African American.  Age. Risk increases with age.  Gender. Men are at higher risk than women before age 45 years. After age 65, women are at higher risk than men. Risk factors you can control include:  Not getting enough exercise or physical activity.  Being overweight.  Getting too much fat, sugar, calories, or salt in your diet.  Drinking too much alcohol. SIGNS AND SYMPTOMS Hypertension does not usually cause signs or symptoms. Extremely high blood pressure (hypertensive crisis) may cause headache, anxiety, shortness of breath, and nosebleed. DIAGNOSIS To check if you have hypertension, your health care provider will measure your blood pressure while you are seated, with your arm held at the level of your heart. It should be measured at least twice using the same arm. Certain conditions can cause a difference in blood pressure between your right and left arms. A blood pressure reading that is higher than normal on one occasion does not mean that you need treatment. If  it is not clear whether you have high blood pressure, you may be asked to return on a different day to have your blood pressure checked again. Or, you may be asked to monitor your blood pressure at home for 1 or more weeks. TREATMENT Treating high blood pressure includes making lifestyle changes and possibly taking medicine. Living a healthy lifestyle can help lower high blood pressure. You may need to change some of your habits. Lifestyle changes may include:  Following the DASH diet. This diet is high in fruits, vegetables, and whole grains. It is low in salt, red meat, and added sugars.  Keep your sodium intake below 2,300 mg per day.  Getting at least 30-45 minutes of aerobic exercise at least 4 times per week.  Losing weight if necessary.  Not smoking.  Limiting alcoholic beverages.  Learning ways to reduce stress. Your health care provider may prescribe medicine if lifestyle changes are not enough to get your blood pressure under control, and if one of the following is true:  You are 18-59 years of age and your systolic blood pressure is above 140.  You are 60 years of age or older, and your systolic blood pressure is above 150.  Your diastolic blood pressure is above 90.  You have diabetes, and your systolic blood pressure is over 140 or your diastolic blood pressure is over 90.  You have kidney disease and your blood pressure is above 140/90.  You have heart disease and your blood pressure is above 140/90. Your personal target blood pressure may vary depending on your medical conditions, your age, and other factors. HOME CARE INSTRUCTIONS    Have your blood pressure rechecked as directed by your health care provider.   Take medicines only as directed by your health care provider. Follow the directions carefully. Blood pressure medicines must be taken as prescribed. The medicine does not work as well when you skip doses. Skipping doses also puts you at risk for  problems.  Do not smoke.   Monitor your blood pressure at home as directed by your health care provider. SEEK MEDICAL CARE IF:   You think you are having a reaction to medicines taken.  You have recurrent headaches or feel dizzy.  You have swelling in your ankles.  You have trouble with your vision. SEEK IMMEDIATE MEDICAL CARE IF:  You develop a severe headache or confusion.  You have unusual weakness, numbness, or feel faint.  You have severe chest or abdominal pain.  You vomit repeatedly.  You have trouble breathing. MAKE SURE YOU:   Understand these instructions.  Will watch your condition.  Will get help right away if you are not doing well or get worse.   This information is not intended to replace advice given to you by your health care provider. Make sure you discuss any questions you have with your health care provider.   Document Released: 05/11/2005 Document Revised: 09/25/2014 Document Reviewed: 03/03/2013 Elsevier Interactive Patient Education 2016 Elsevier Inc.  

## 2015-06-28 ENCOUNTER — Ambulatory Visit (HOSPITAL_BASED_OUTPATIENT_CLINIC_OR_DEPARTMENT_OTHER): Payer: Medicare Other | Admitting: Clinical

## 2015-06-28 DIAGNOSIS — F4323 Adjustment disorder with mixed anxiety and depressed mood: Secondary | ICD-10-CM | POA: Diagnosis not present

## 2015-07-02 ENCOUNTER — Ambulatory Visit: Payer: Medicare Other | Attending: Family Medicine

## 2015-07-02 DIAGNOSIS — I10 Essential (primary) hypertension: Secondary | ICD-10-CM | POA: Diagnosis not present

## 2015-07-02 DIAGNOSIS — M2570 Osteophyte, unspecified joint: Secondary | ICD-10-CM | POA: Diagnosis not present

## 2015-07-02 DIAGNOSIS — Z79899 Other long term (current) drug therapy: Secondary | ICD-10-CM | POA: Diagnosis not present

## 2015-07-02 LAB — COMPLETE METABOLIC PANEL WITH GFR
ALT: 25 U/L (ref 6–29)
AST: 23 U/L (ref 10–35)
Albumin: 3.6 g/dL (ref 3.6–5.1)
Alkaline Phosphatase: 140 U/L — ABNORMAL HIGH (ref 33–130)
BILIRUBIN TOTAL: 0.4 mg/dL (ref 0.2–1.2)
BUN: 18 mg/dL (ref 7–25)
CALCIUM: 9.3 mg/dL (ref 8.6–10.4)
CO2: 23 mmol/L (ref 20–31)
CREATININE: 0.92 mg/dL (ref 0.50–0.99)
Chloride: 106 mmol/L (ref 98–110)
GFR, EST AFRICAN AMERICAN: 75 mL/min (ref >=60)
GFR, Est Non African American: 65 mL/min (ref >=60)
Glucose, Bld: 85 mg/dL (ref 65–99)
Potassium: 5 mmol/L (ref 3.5–5.3)
Sodium: 141 mmol/L (ref 135–146)
TOTAL PROTEIN: 7.3 g/dL (ref 6.1–8.1)

## 2015-07-02 LAB — LIPID PANEL
Cholesterol: 250 mg/dL — ABNORMAL HIGH (ref 125–200)
HDL: 96 mg/dL (ref >=46)
LDL CALC: 134 mg/dL — AB (ref <130)
TRIGLYCERIDES: 102 mg/dL (ref <150)
Total CHOL/HDL Ratio: 2.6 Ratio (ref <=5.0)
VLDL: 20 mg/dL (ref <30)

## 2015-07-04 ENCOUNTER — Telehealth: Payer: Self-pay | Admitting: *Deleted

## 2015-07-04 ENCOUNTER — Other Ambulatory Visit: Payer: Self-pay | Admitting: Family Medicine

## 2015-07-04 DIAGNOSIS — E785 Hyperlipidemia, unspecified: Secondary | ICD-10-CM | POA: Insufficient documentation

## 2015-07-04 MED ORDER — PRAVASTATIN SODIUM 20 MG PO TABS: 20.0000 mg | ORAL_TABLET | Freq: Every day | ORAL | Status: DC

## 2015-07-04 NOTE — Telephone Encounter (Signed)
Spoke to patient and verified name and date of birth and gave instructions from MD.  Patient had no questions and verbalized understanding.

## 2015-07-04 NOTE — Telephone Encounter (Signed)
-----   Message from Arnoldo Morale, MD sent at 07/04/2015 12:39 PM EST ----- Cholesterol is elevated and so I have sent a prescription for pravastatin to the pharmacy. Advised on low cholesterol diet.

## 2015-08-02 ENCOUNTER — Encounter: Payer: Self-pay | Admitting: Clinical

## 2015-08-02 NOTE — Progress Notes (Signed)
Depression screen [facility] 2/9 06/27/2015 07/28/2013  Decreased Interest 2 0  Down, Depressed, Hopeless 3 1  PHQ - 2 Score 5 1  Altered sleeping 3 -  Tired, decreased energy 2 -  Change in appetite 3 -  Feeling bad or failure about yourself  3 -  Trouble concentrating 2 -  Moving slowly or fidgety/restless 1 -  Suicidal thoughts 2 -  PHQ-9 Score 21 -    GAD 7 : Generalized Anxiety Score 06/27/2015  Nervous, Anxious, on Edge 2  Control/stop worrying 3  Worry too much - different things 2  Trouble relaxing 0  Restless 2  Easily annoyed or irritable 2  Afraid - awful might happen 2  Total GAD 7 Score 13

## 2015-08-06 DIAGNOSIS — M0570 Rheumatoid arthritis with rheumatoid factor of unspecified site without organ or systems involvement: Secondary | ICD-10-CM | POA: Diagnosis not present

## 2015-08-06 DIAGNOSIS — Z79899 Other long term (current) drug therapy: Secondary | ICD-10-CM | POA: Diagnosis not present

## 2015-08-06 DIAGNOSIS — M25432 Effusion, left wrist: Secondary | ICD-10-CM | POA: Diagnosis not present

## 2015-08-06 DIAGNOSIS — M25431 Effusion, right wrist: Secondary | ICD-10-CM | POA: Diagnosis not present

## 2015-09-05 ENCOUNTER — Other Ambulatory Visit: Payer: Self-pay | Admitting: Family Medicine

## 2015-09-05 DIAGNOSIS — Z1231 Encounter for screening mammogram for malignant neoplasm of breast: Secondary | ICD-10-CM

## 2015-09-09 ENCOUNTER — Ambulatory Visit (HOSPITAL_BASED_OUTPATIENT_CLINIC_OR_DEPARTMENT_OTHER): Payer: Self-pay

## 2015-09-23 ENCOUNTER — Encounter: Payer: Self-pay | Admitting: Family Medicine

## 2015-09-23 ENCOUNTER — Ambulatory Visit: Payer: Medicare Other | Attending: Family Medicine | Admitting: Family Medicine

## 2015-09-23 VITALS — BP 122/82 | HR 72 | Temp 98.2°F | Resp 16 | Ht 62.0 in | Wt 151.4 lb

## 2015-09-23 DIAGNOSIS — M069 Rheumatoid arthritis, unspecified: Secondary | ICD-10-CM | POA: Insufficient documentation

## 2015-09-23 DIAGNOSIS — F329 Major depressive disorder, single episode, unspecified: Secondary | ICD-10-CM | POA: Diagnosis not present

## 2015-09-23 DIAGNOSIS — B353 Tinea pedis: Secondary | ICD-10-CM | POA: Diagnosis not present

## 2015-09-23 DIAGNOSIS — M0579 Rheumatoid arthritis with rheumatoid factor of multiple sites without organ or systems involvement: Secondary | ICD-10-CM

## 2015-09-23 DIAGNOSIS — F32A Depression, unspecified: Secondary | ICD-10-CM | POA: Insufficient documentation

## 2015-09-23 DIAGNOSIS — K121 Other forms of stomatitis: Secondary | ICD-10-CM | POA: Insufficient documentation

## 2015-09-23 DIAGNOSIS — K5900 Constipation, unspecified: Secondary | ICD-10-CM | POA: Insufficient documentation

## 2015-09-23 DIAGNOSIS — Z79899 Other long term (current) drug therapy: Secondary | ICD-10-CM | POA: Diagnosis not present

## 2015-09-23 DIAGNOSIS — I10 Essential (primary) hypertension: Secondary | ICD-10-CM | POA: Insufficient documentation

## 2015-09-23 DIAGNOSIS — K5909 Other constipation: Secondary | ICD-10-CM

## 2015-09-23 MED ORDER — TERBINAFINE HCL 1 % EX CREA: 1.0000 "application " | TOPICAL_CREAM | Freq: Two times a day (BID) | CUTANEOUS | Status: DC

## 2015-09-23 MED ORDER — CARVEDILOL 6.25 MG PO TABS: 6.2500 mg | ORAL_TABLET | Freq: Two times a day (BID) | ORAL | Status: DC

## 2015-09-23 MED ORDER — LACTULOSE 10 GM/15ML PO SOLN: 10.0000 g | Freq: Three times a day (TID) | ORAL | Status: DC

## 2015-09-23 NOTE — Addendum Note (Signed)
Addended by: Wonda Olds L on: 09/23/2015 05:07 PM   Modules accepted: Miquel Dunn
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
## 2015-09-23 NOTE — Progress Notes (Signed)
Patient's here for HTN f/up and to discuss sores on arms and shin.  Patient reports having an allergic reaction to milk causing her bottom lip to swell.

## 2015-09-26 ENCOUNTER — Telehealth: Payer: Self-pay

## 2015-09-26 NOTE — Telephone Encounter (Signed)
Dr. Jarold Song ordered [facility] SN services for disease process teaching, symptom management. This Case Manager placed call to patient to determine if patient had a home health agency preference. Patient declined home health agency preference and passed phone to her grandson who also declined agency preference. This Case Manager placed call to [facility] and spoke with Estill Bamberg in Intake who indicated [facility] accepting outpatient referrals. She requested referral to be sent to Fax [phone]. Information faxed as requested. Estill Bamberg informed this Case Manager she would update this Case Manager if [facility] unable to accept.

## 2015-09-29 DIAGNOSIS — M059 Rheumatoid arthritis with rheumatoid factor, unspecified: Secondary | ICD-10-CM | POA: Diagnosis not present

## 2015-09-29 DIAGNOSIS — R2689 Other abnormalities of gait and mobility: Secondary | ICD-10-CM | POA: Diagnosis not present

## 2015-09-29 DIAGNOSIS — I10 Essential (primary) hypertension: Secondary | ICD-10-CM | POA: Diagnosis not present

## 2015-09-29 DIAGNOSIS — F329 Major depressive disorder, single episode, unspecified: Secondary | ICD-10-CM | POA: Diagnosis not present

## 2015-09-29 DIAGNOSIS — Z9181 History of falling: Secondary | ICD-10-CM | POA: Diagnosis not present

## 2015-09-29 DIAGNOSIS — F17210 Nicotine dependence, cigarettes, uncomplicated: Secondary | ICD-10-CM | POA: Diagnosis not present

## 2015-09-29 DIAGNOSIS — M0639 Rheumatoid nodule, multiple sites: Secondary | ICD-10-CM | POA: Diagnosis not present

## 2015-09-29 DIAGNOSIS — R278 Other lack of coordination: Secondary | ICD-10-CM | POA: Diagnosis not present

## 2015-09-30 ENCOUNTER — Telehealth: Payer: Self-pay | Admitting: Family Medicine

## 2015-09-30 DIAGNOSIS — R278 Other lack of coordination: Secondary | ICD-10-CM | POA: Diagnosis not present

## 2015-09-30 DIAGNOSIS — M0639 Rheumatoid nodule, multiple sites: Secondary | ICD-10-CM | POA: Diagnosis not present

## 2015-09-30 DIAGNOSIS — F329 Major depressive disorder, single episode, unspecified: Secondary | ICD-10-CM | POA: Diagnosis not present

## 2015-09-30 DIAGNOSIS — I10 Essential (primary) hypertension: Secondary | ICD-10-CM | POA: Diagnosis not present

## 2015-09-30 DIAGNOSIS — R2689 Other abnormalities of gait and mobility: Secondary | ICD-10-CM | POA: Diagnosis not present

## 2015-09-30 DIAGNOSIS — M059 Rheumatoid arthritis with rheumatoid factor, unspecified: Secondary | ICD-10-CM | POA: Diagnosis not present

## 2015-09-30 NOTE — Telephone Encounter (Signed)
Almira from [facility] called to get verbal orders for physical therapy 2 times a week for 3 week. Please f/u

## 2015-10-01 ENCOUNTER — Telehealth: Payer: Self-pay

## 2015-10-01 NOTE — Telephone Encounter (Signed)
Call placed to [facility] # [phone] and spoke to [place] who confirmed that the patient is active with them and was opened 09/29/15.

## 2015-10-02 NOTE — Telephone Encounter (Signed)
This Case Manager called Almira from [facility] and verbal order given for [facility] services. No additional needs identified.

## 2015-10-03 DIAGNOSIS — F329 Major depressive disorder, single episode, unspecified: Secondary | ICD-10-CM | POA: Diagnosis not present

## 2015-10-03 DIAGNOSIS — R278 Other lack of coordination: Secondary | ICD-10-CM | POA: Diagnosis not present

## 2015-10-03 DIAGNOSIS — R2689 Other abnormalities of gait and mobility: Secondary | ICD-10-CM | POA: Diagnosis not present

## 2015-10-03 DIAGNOSIS — M059 Rheumatoid arthritis with rheumatoid factor, unspecified: Secondary | ICD-10-CM | POA: Diagnosis not present

## 2015-10-03 DIAGNOSIS — I10 Essential (primary) hypertension: Secondary | ICD-10-CM | POA: Diagnosis not present

## 2015-10-03 DIAGNOSIS — M0639 Rheumatoid nodule, multiple sites: Secondary | ICD-10-CM | POA: Diagnosis not present

## 2015-10-04 DIAGNOSIS — R2689 Other abnormalities of gait and mobility: Secondary | ICD-10-CM | POA: Diagnosis not present

## 2015-10-04 DIAGNOSIS — F329 Major depressive disorder, single episode, unspecified: Secondary | ICD-10-CM | POA: Diagnosis not present

## 2015-10-04 DIAGNOSIS — R278 Other lack of coordination: Secondary | ICD-10-CM | POA: Diagnosis not present

## 2015-10-04 DIAGNOSIS — I10 Essential (primary) hypertension: Secondary | ICD-10-CM | POA: Diagnosis not present

## 2015-10-04 DIAGNOSIS — M0639 Rheumatoid nodule, multiple sites: Secondary | ICD-10-CM | POA: Diagnosis not present

## 2015-10-04 DIAGNOSIS — M059 Rheumatoid arthritis with rheumatoid factor, unspecified: Secondary | ICD-10-CM | POA: Diagnosis not present

## 2015-10-07 DIAGNOSIS — M0570 Rheumatoid arthritis with rheumatoid factor of unspecified site without organ or systems involvement: Secondary | ICD-10-CM | POA: Diagnosis not present

## 2015-10-07 DIAGNOSIS — Z79899 Other long term (current) drug therapy: Secondary | ICD-10-CM | POA: Diagnosis not present

## 2015-10-08 DIAGNOSIS — M059 Rheumatoid arthritis with rheumatoid factor, unspecified: Secondary | ICD-10-CM | POA: Diagnosis not present

## 2015-10-08 DIAGNOSIS — R278 Other lack of coordination: Secondary | ICD-10-CM | POA: Diagnosis not present

## 2015-10-08 DIAGNOSIS — I10 Essential (primary) hypertension: Secondary | ICD-10-CM | POA: Diagnosis not present

## 2015-10-08 DIAGNOSIS — M0639 Rheumatoid nodule, multiple sites: Secondary | ICD-10-CM | POA: Diagnosis not present

## 2015-10-08 DIAGNOSIS — R2689 Other abnormalities of gait and mobility: Secondary | ICD-10-CM | POA: Diagnosis not present

## 2015-10-08 DIAGNOSIS — F329 Major depressive disorder, single episode, unspecified: Secondary | ICD-10-CM | POA: Diagnosis not present

## 2015-10-09 DIAGNOSIS — I10 Essential (primary) hypertension: Secondary | ICD-10-CM | POA: Diagnosis not present

## 2015-10-09 DIAGNOSIS — R2689 Other abnormalities of gait and mobility: Secondary | ICD-10-CM | POA: Diagnosis not present

## 2015-10-09 DIAGNOSIS — M0639 Rheumatoid nodule, multiple sites: Secondary | ICD-10-CM | POA: Diagnosis not present

## 2015-10-09 DIAGNOSIS — M059 Rheumatoid arthritis with rheumatoid factor, unspecified: Secondary | ICD-10-CM | POA: Diagnosis not present

## 2015-10-09 DIAGNOSIS — R278 Other lack of coordination: Secondary | ICD-10-CM | POA: Diagnosis not present

## 2015-10-09 DIAGNOSIS — F329 Major depressive disorder, single episode, unspecified: Secondary | ICD-10-CM | POA: Diagnosis not present

## 2015-10-10 DIAGNOSIS — D649 Anemia, unspecified: Secondary | ICD-10-CM | POA: Diagnosis not present

## 2015-10-10 DIAGNOSIS — M0609 Rheumatoid arthritis without rheumatoid factor, multiple sites: Secondary | ICD-10-CM | POA: Diagnosis not present

## 2015-10-10 DIAGNOSIS — M25431 Effusion, right wrist: Secondary | ICD-10-CM | POA: Diagnosis not present

## 2015-10-10 DIAGNOSIS — M059 Rheumatoid arthritis with rheumatoid factor, unspecified: Secondary | ICD-10-CM | POA: Diagnosis not present

## 2015-10-10 DIAGNOSIS — Z79899 Other long term (current) drug therapy: Secondary | ICD-10-CM | POA: Diagnosis not present

## 2015-10-10 DIAGNOSIS — R278 Other lack of coordination: Secondary | ICD-10-CM | POA: Diagnosis not present

## 2015-10-10 DIAGNOSIS — R799 Abnormal finding of blood chemistry, unspecified: Secondary | ICD-10-CM | POA: Diagnosis not present

## 2015-10-10 DIAGNOSIS — M545 Low back pain: Secondary | ICD-10-CM | POA: Diagnosis not present

## 2015-10-10 DIAGNOSIS — M25432 Effusion, left wrist: Secondary | ICD-10-CM | POA: Diagnosis not present

## 2015-10-10 DIAGNOSIS — I10 Essential (primary) hypertension: Secondary | ICD-10-CM | POA: Diagnosis not present

## 2015-10-10 DIAGNOSIS — M0639 Rheumatoid nodule, multiple sites: Secondary | ICD-10-CM | POA: Diagnosis not present

## 2015-10-10 DIAGNOSIS — R2689 Other abnormalities of gait and mobility: Secondary | ICD-10-CM | POA: Diagnosis not present

## 2015-10-10 DIAGNOSIS — F329 Major depressive disorder, single episode, unspecified: Secondary | ICD-10-CM | POA: Diagnosis not present

## 2015-10-11 DIAGNOSIS — I10 Essential (primary) hypertension: Secondary | ICD-10-CM | POA: Diagnosis not present

## 2015-10-11 DIAGNOSIS — R278 Other lack of coordination: Secondary | ICD-10-CM | POA: Diagnosis not present

## 2015-10-11 DIAGNOSIS — M0639 Rheumatoid nodule, multiple sites: Secondary | ICD-10-CM | POA: Diagnosis not present

## 2015-10-11 DIAGNOSIS — F329 Major depressive disorder, single episode, unspecified: Secondary | ICD-10-CM | POA: Diagnosis not present

## 2015-10-11 DIAGNOSIS — M059 Rheumatoid arthritis with rheumatoid factor, unspecified: Secondary | ICD-10-CM | POA: Diagnosis not present

## 2015-10-11 DIAGNOSIS — R2689 Other abnormalities of gait and mobility: Secondary | ICD-10-CM | POA: Diagnosis not present

## 2015-10-14 DIAGNOSIS — F329 Major depressive disorder, single episode, unspecified: Secondary | ICD-10-CM | POA: Diagnosis not present

## 2015-10-14 DIAGNOSIS — R278 Other lack of coordination: Secondary | ICD-10-CM | POA: Diagnosis not present

## 2015-10-14 DIAGNOSIS — I10 Essential (primary) hypertension: Secondary | ICD-10-CM | POA: Diagnosis not present

## 2015-10-14 DIAGNOSIS — M0639 Rheumatoid nodule, multiple sites: Secondary | ICD-10-CM | POA: Diagnosis not present

## 2015-10-14 DIAGNOSIS — R2689 Other abnormalities of gait and mobility: Secondary | ICD-10-CM | POA: Diagnosis not present

## 2015-10-14 DIAGNOSIS — M059 Rheumatoid arthritis with rheumatoid factor, unspecified: Secondary | ICD-10-CM | POA: Diagnosis not present

## 2015-10-15 DIAGNOSIS — R2689 Other abnormalities of gait and mobility: Secondary | ICD-10-CM | POA: Diagnosis not present

## 2015-10-15 DIAGNOSIS — F329 Major depressive disorder, single episode, unspecified: Secondary | ICD-10-CM | POA: Diagnosis not present

## 2015-10-15 DIAGNOSIS — M0639 Rheumatoid nodule, multiple sites: Secondary | ICD-10-CM | POA: Diagnosis not present

## 2015-10-15 DIAGNOSIS — R278 Other lack of coordination: Secondary | ICD-10-CM | POA: Diagnosis not present

## 2015-10-15 DIAGNOSIS — M059 Rheumatoid arthritis with rheumatoid factor, unspecified: Secondary | ICD-10-CM | POA: Diagnosis not present

## 2015-10-15 DIAGNOSIS — I10 Essential (primary) hypertension: Secondary | ICD-10-CM | POA: Diagnosis not present

## 2015-10-16 DIAGNOSIS — F329 Major depressive disorder, single episode, unspecified: Secondary | ICD-10-CM | POA: Diagnosis not present

## 2015-10-16 DIAGNOSIS — R278 Other lack of coordination: Secondary | ICD-10-CM | POA: Diagnosis not present

## 2015-10-16 DIAGNOSIS — R2689 Other abnormalities of gait and mobility: Secondary | ICD-10-CM | POA: Diagnosis not present

## 2015-10-16 DIAGNOSIS — I10 Essential (primary) hypertension: Secondary | ICD-10-CM | POA: Diagnosis not present

## 2015-10-16 DIAGNOSIS — M0639 Rheumatoid nodule, multiple sites: Secondary | ICD-10-CM | POA: Diagnosis not present

## 2015-10-16 DIAGNOSIS — M059 Rheumatoid arthritis with rheumatoid factor, unspecified: Secondary | ICD-10-CM | POA: Diagnosis not present

## 2015-10-17 DIAGNOSIS — M059 Rheumatoid arthritis with rheumatoid factor, unspecified: Secondary | ICD-10-CM | POA: Diagnosis not present

## 2015-10-17 DIAGNOSIS — F329 Major depressive disorder, single episode, unspecified: Secondary | ICD-10-CM | POA: Diagnosis not present

## 2015-10-17 DIAGNOSIS — I10 Essential (primary) hypertension: Secondary | ICD-10-CM | POA: Diagnosis not present

## 2015-10-17 DIAGNOSIS — M0639 Rheumatoid nodule, multiple sites: Secondary | ICD-10-CM | POA: Diagnosis not present

## 2015-10-17 DIAGNOSIS — R2689 Other abnormalities of gait and mobility: Secondary | ICD-10-CM | POA: Diagnosis not present

## 2015-10-17 DIAGNOSIS — R278 Other lack of coordination: Secondary | ICD-10-CM | POA: Diagnosis not present

## 2015-10-18 DIAGNOSIS — M059 Rheumatoid arthritis with rheumatoid factor, unspecified: Secondary | ICD-10-CM | POA: Diagnosis not present

## 2015-10-18 DIAGNOSIS — F329 Major depressive disorder, single episode, unspecified: Secondary | ICD-10-CM | POA: Diagnosis not present

## 2015-10-18 DIAGNOSIS — R278 Other lack of coordination: Secondary | ICD-10-CM | POA: Diagnosis not present

## 2015-10-18 DIAGNOSIS — R2689 Other abnormalities of gait and mobility: Secondary | ICD-10-CM | POA: Diagnosis not present

## 2015-10-18 DIAGNOSIS — I10 Essential (primary) hypertension: Secondary | ICD-10-CM | POA: Diagnosis not present

## 2015-10-18 DIAGNOSIS — M0639 Rheumatoid nodule, multiple sites: Secondary | ICD-10-CM | POA: Diagnosis not present

## 2015-10-22 DIAGNOSIS — I10 Essential (primary) hypertension: Secondary | ICD-10-CM | POA: Diagnosis not present

## 2015-10-22 DIAGNOSIS — R278 Other lack of coordination: Secondary | ICD-10-CM | POA: Diagnosis not present

## 2015-10-22 DIAGNOSIS — M059 Rheumatoid arthritis with rheumatoid factor, unspecified: Secondary | ICD-10-CM | POA: Diagnosis not present

## 2015-10-22 DIAGNOSIS — F329 Major depressive disorder, single episode, unspecified: Secondary | ICD-10-CM | POA: Diagnosis not present

## 2015-10-22 DIAGNOSIS — R2689 Other abnormalities of gait and mobility: Secondary | ICD-10-CM | POA: Diagnosis not present

## 2015-10-22 DIAGNOSIS — M0639 Rheumatoid nodule, multiple sites: Secondary | ICD-10-CM | POA: Diagnosis not present

## 2015-10-23 DIAGNOSIS — M069 Rheumatoid arthritis, unspecified: Secondary | ICD-10-CM | POA: Diagnosis not present

## 2015-10-23 DIAGNOSIS — Z79899 Other long term (current) drug therapy: Secondary | ICD-10-CM | POA: Diagnosis not present

## 2015-10-24 ENCOUNTER — Telehealth: Payer: Self-pay | Admitting: Family Medicine

## 2015-10-24 DIAGNOSIS — R278 Other lack of coordination: Secondary | ICD-10-CM | POA: Diagnosis not present

## 2015-10-24 DIAGNOSIS — I10 Essential (primary) hypertension: Secondary | ICD-10-CM | POA: Diagnosis not present

## 2015-10-24 DIAGNOSIS — M0639 Rheumatoid nodule, multiple sites: Secondary | ICD-10-CM | POA: Diagnosis not present

## 2015-10-24 DIAGNOSIS — R2689 Other abnormalities of gait and mobility: Secondary | ICD-10-CM | POA: Diagnosis not present

## 2015-10-24 DIAGNOSIS — F329 Major depressive disorder, single episode, unspecified: Secondary | ICD-10-CM | POA: Diagnosis not present

## 2015-10-24 DIAGNOSIS — M059 Rheumatoid arthritis with rheumatoid factor, unspecified: Secondary | ICD-10-CM | POA: Diagnosis not present

## 2015-10-24 NOTE — Telephone Encounter (Signed)
Pt. Called stating that her PCP wanted her out of methotrexate (RHEUMATREX) 2.5 MG tablet  And her arthritis doctor put her on hydroxychloroquine 200 mg. Pt. Is worried because her A1C Is 3. Please f/u

## 2015-10-24 NOTE — Telephone Encounter (Signed)
This Case Manager placed return call to patient. Patient had medication concerns and put her grandson on the phone to speak with this Case Manager. Patient's grandson indicated patient had an appointment on 10/09/15 with Rheumatologist-Dr. Charlestine Night. Patient's grandson indicated patient's Methotrexate (2.5 mg tablet) dose was decreased to 4 tablets on Wednesday and Thursday. Patient's grandson indicated Dr. Charlestine Night also added hydroxychloroquine 200 mg (two tablets daily). Patient and patient's grandson concerned because "medications do the same thing" and uncertain if patient should be taking both medications. Patient's grandson also concerned that methotrexate may be causing "mouth sores."  Encouraged patient and patient's grandson to contact Dr. Elmon Else office for medication clarification and to discuss concerns; however, patient's grandson indicated office on holiday this week. Encouraged him to call office as soon as possible and informed patient and patient's grandson that encounter will be routed to Dr. Jarold Song as well.

## 2015-10-25 DIAGNOSIS — R2689 Other abnormalities of gait and mobility: Secondary | ICD-10-CM | POA: Diagnosis not present

## 2015-10-25 DIAGNOSIS — R278 Other lack of coordination: Secondary | ICD-10-CM | POA: Diagnosis not present

## 2015-10-25 DIAGNOSIS — M0639 Rheumatoid nodule, multiple sites: Secondary | ICD-10-CM | POA: Diagnosis not present

## 2015-10-25 DIAGNOSIS — M059 Rheumatoid arthritis with rheumatoid factor, unspecified: Secondary | ICD-10-CM | POA: Diagnosis not present

## 2015-10-25 DIAGNOSIS — F329 Major depressive disorder, single episode, unspecified: Secondary | ICD-10-CM | POA: Diagnosis not present

## 2015-10-25 DIAGNOSIS — I10 Essential (primary) hypertension: Secondary | ICD-10-CM | POA: Diagnosis not present

## 2015-11-05 ENCOUNTER — Other Ambulatory Visit: Payer: Self-pay | Admitting: Pharmacist

## 2015-11-05 DIAGNOSIS — I10 Essential (primary) hypertension: Secondary | ICD-10-CM

## 2015-11-05 DIAGNOSIS — R0982 Postnasal drip: Secondary | ICD-10-CM

## 2015-11-05 MED ORDER — CARVEDILOL 6.25 MG PO TABS: 6.2500 mg | ORAL_TABLET | Freq: Two times a day (BID) | ORAL | Status: DC

## 2015-11-05 MED ORDER — CETIRIZINE HCL 10 MG PO TABS: 10.0000 mg | ORAL_TABLET | Freq: Every day | ORAL | Status: DC

## 2015-11-05 NOTE — Telephone Encounter (Signed)
Writer called [facility] back and spoke with a pharmacist.  Writer gave her the information that was in her chart.  Motrin (ibuprofen), shell fish (shrimp), Iodine dye used for diagnostic procedures.

## 2015-11-05 NOTE — Telephone Encounter (Signed)
Amy from [facility] called stating that the pt. Is allergic to some things And she would like to know what allergies the pt. Has on file. Please f/u

## 2015-11-08 DIAGNOSIS — R2689 Other abnormalities of gait and mobility: Secondary | ICD-10-CM | POA: Diagnosis not present

## 2015-11-08 DIAGNOSIS — I10 Essential (primary) hypertension: Secondary | ICD-10-CM | POA: Diagnosis not present

## 2015-11-08 DIAGNOSIS — F329 Major depressive disorder, single episode, unspecified: Secondary | ICD-10-CM | POA: Diagnosis not present

## 2015-11-08 DIAGNOSIS — R278 Other lack of coordination: Secondary | ICD-10-CM | POA: Diagnosis not present

## 2015-11-08 DIAGNOSIS — M0639 Rheumatoid nodule, multiple sites: Secondary | ICD-10-CM | POA: Diagnosis not present

## 2015-11-08 DIAGNOSIS — M059 Rheumatoid arthritis with rheumatoid factor, unspecified: Secondary | ICD-10-CM | POA: Diagnosis not present

## 2015-11-15 ENCOUNTER — Ambulatory Visit: Payer: Medicare Other | Attending: Family Medicine | Admitting: Family Medicine

## 2015-11-15 ENCOUNTER — Encounter: Payer: Self-pay | Admitting: Family Medicine

## 2015-11-15 VITALS — BP 130/80 | HR 63 | Temp 98.6°F | Resp 20 | Ht 62.0 in | Wt 149.0 lb

## 2015-11-15 DIAGNOSIS — L304 Erythema intertrigo: Secondary | ICD-10-CM | POA: Insufficient documentation

## 2015-11-15 DIAGNOSIS — Z79899 Other long term (current) drug therapy: Secondary | ICD-10-CM | POA: Diagnosis not present

## 2015-11-15 DIAGNOSIS — I10 Essential (primary) hypertension: Secondary | ICD-10-CM | POA: Insufficient documentation

## 2015-11-15 MED ORDER — CLOTRIMAZOLE 1 % EX CREA: 1.0000 "application " | TOPICAL_CREAM | Freq: Two times a day (BID) | CUTANEOUS | Status: DC

## 2015-11-15 NOTE — Patient Instructions (Signed)
Intertrigo Intertrigo is a skin condition that occurs in between folds of skin in places on the body that rub together a lot and do not get much ventilation. It is caused by heat, moisture, friction, sweat retention, and lack of air circulation, which produces red, irritated patches and, sometimes, scaling or drainage. People who have diabetes, who are obese, or who have treatment with antibiotics are at increased risk for intertrigo. The most common sites for intertrigo to occur include:  The groin.  The breasts.  The armpits.  Folds of abdominal skin.  Webbed spaces between the fingers or toes. Intertrigo may be aggravated by:  Sweat.  Feces.  Yeast or bacteria that are present near skin folds.  Urine.  Vaginal discharge. HOME CARE INSTRUCTIONS  The following steps can be taken to reduce friction and keep the affected area cool and dry:  Expose skin folds to the air.  Keep deep skin folds separated with cotton or linen cloth. Avoid tight fitting clothing that could cause chafing.  Wear open-toed shoes or sandals to help reduce moisture between the toes.  Apply absorbent powders to affected areas as directed by your caregiver.  Apply over-the-counter barrier pastes, such as zinc oxide, as directed by your caregiver.  If you develop a fungal infection in the affected area, your caregiver may have you use antifungal creams. SEEK MEDICAL CARE IF:   The rash is not improving after 1 week of treatment.  The rash is getting worse (more red, more swollen, more painful, or spreading).  You have a fever or chills. MAKE SURE YOU:   Understand these instructions.  Will watch your condition.  Will get help right away if you are not doing well or get worse.   This information is not intended to replace advice given to you by your health care provider. Make sure you discuss any questions you have with your health care provider.   Document Released: 05/11/2005 Document  Revised: 08/03/2011 Document Reviewed: 11/12/2014 Elsevier Interactive Patient Education 2016 Elsevier Inc.  

## 2015-11-15 NOTE — Progress Notes (Signed)
New rash under breast-duration 2 months Plaquenil questions

## 2015-11-15 NOTE — Progress Notes (Signed)
Subjective:  Patient ID: Kristy Oliver, female    DOB: 1945/06/25  Age: 70 y.o. MRN: [id number]  CC: Rash   HPI Kristy Oliver is a 70 year old female with a history of rheumatoid arthritis, hypertension who was treated for tinea pedis at her last office visit and comes today for follow-up. She reports improvement in her foot symptoms however complains of pruritic rash beneath both breasts for the last 2 months.  She is wondering if she should remain on Plaquenil and if it is good for her but I have explained to her that this is prescribed by rheumatology and is needed to treat her rheumatoid arthritis. Further questions regarding this medication should be directed to her rheumatologist.  Outpatient Prescriptions Prior to Visit  Medication Sig Dispense Refill  . carvedilol (COREG) 6.25 MG tablet Take 1 tablet (6.25 mg total) by mouth 2 (two) times daily with a meal. 60 tablet 2  . cetirizine (ZYRTEC) 10 MG tablet Take 1 tablet (10 mg total) by mouth daily. 30 tablet 2  . folic acid (FOLVITE) A999333 MCG tablet Take 400 mcg by mouth daily.    . methotrexate (RHEUMATREX) 2.5 MG tablet Take 12.5 mg by mouth 2 (two) times a week. Methotrexate 2.5mg , 5 tablets on Wednesday and Thrusday    . predniSONE (DELTASONE) 10 MG tablet Take 10 mg by mouth daily with breakfast.    . terbinafine (LAMISIL AT) 1 % cream Apply 1 application topically 2 (two) times daily. 30 g 2  . colchicine 0.6 MG tablet Take 1 tablet (0.6 mg total) by mouth daily. (Patient not taking: Reported on 09/23/2015) 30 tablet 3  . dronabinol (MARINOL) 2.5 MG capsule Take 1 capsule (2.5 mg total) by mouth 2 (two) times daily before lunch and supper. (Patient not taking: Reported on 09/23/2015) 60 capsule 3  . folic acid (FOLVITE) 1 MG tablet Take 1 mg by mouth daily. Reported on 11/15/2015    . lactulose (CHRONULAC) 10 GM/15ML solution Take 15 mLs (10 g total) by mouth 3 (three) times daily. (Patient not taking: Reported on 11/15/2015) 946  mL 2  . methylPREDNISolone (MEDROL DOSEPAK) 4 MG tablet follow package directions (Patient not taking: Reported on 11/15/2015) 21 tablet 0  . pravastatin (PRAVACHOL) 20 MG tablet Take 1 tablet (20 mg total) by mouth daily. (Patient not taking: Reported on 11/15/2015) 30 tablet 3  . simethicone (GAS-X) 80 MG chewable tablet Chew 1 tablet (80 mg total) by mouth every 6 (six) hours as needed (bloating symptoms). 30 tablet 1   No facility-administered medications prior to visit.    ROS Review of Systems  Constitutional: Negative for activity change and appetite change.  HENT: Negative for sinus pressure and sore throat.   Respiratory: Negative for chest tightness, shortness of breath and wheezing.   Cardiovascular: Negative for chest pain and palpitations.  Gastrointestinal: Negative for abdominal pain, constipation and abdominal distention.  Genitourinary: Negative.   Musculoskeletal: Positive for joint swelling, arthralgias and gait problem.  Skin:       See history of present illness  Psychiatric/Behavioral: Negative for behavioral problems and dysphoric mood.    Objective:  BP 130/80 mmHg  Pulse 63  Temp(Src) 98.6 F (37 C) (Oral)  Resp 20  Ht 5\' 2"  (1.575 m)  Wt 149 lb (67.586 kg)  BMI 27.25 kg/m2  SpO2 94%  BP/Weight 11/15/2015 0000000 [id number]  Systolic BP [id number]  Diastolic BP 80 82 80  Wt. (Lbs) 149 151.4 150  BMI 27.25 27.68 27.43  Physical Exam Constitutional: She is oriented to person, place, and time. She appears well-developed and well-nourished.  HEENT: ulcerations on buccal mucosa and angle of the mouth Cardiovascular: Normal rate, normal heart sounds and intact distal pulses.   No murmur heard. Pulmonary/Chest: Effort normal and breath sounds normal. She has no wheezes. She has no rales. She exhibits no tenderness.  Abdominal: Soft. Bowel sounds are normal. She exhibits no distension and no mass. There is no tenderness.  Musculoskeletal:  Multiple  rheumatoid nodules in hands and elbows.  Skin: Intertrigo rash with hyperpigmentation in inframammary region and erythematous scabs in between breasts Neurological: She is alert and oriented to person, place, and time.   Assessment & Plan:   1. Intertrigo - clotrimazole (LOTRIMIN) 1 % cream; Apply 1 application topically 2 (two) times daily.  Dispense: 30 g; Refill: 2  2. Essential hypertension Blood pressure was initially elevated but repeat performed manually was normal.   Meds ordered this encounter  Medications  . clotrimazole (LOTRIMIN) 1 % cream    Sig: Apply 1 application topically 2 (two) times daily.    Dispense:  30 g    Refill:  2    Follow-up: Return in about 3 months (around 02/15/2016) for Follow-up on hypertension.   Arnoldo Morale MD

## 2015-12-09 DIAGNOSIS — M25431 Effusion, right wrist: Secondary | ICD-10-CM | POA: Diagnosis not present

## 2015-12-09 DIAGNOSIS — M0609 Rheumatoid arthritis without rheumatoid factor, multiple sites: Secondary | ICD-10-CM | POA: Diagnosis not present

## 2015-12-09 DIAGNOSIS — M25441 Effusion, right hand: Secondary | ICD-10-CM | POA: Diagnosis not present

## 2015-12-09 DIAGNOSIS — M25442 Effusion, left hand: Secondary | ICD-10-CM | POA: Diagnosis not present

## 2015-12-09 DIAGNOSIS — L309 Dermatitis, unspecified: Secondary | ICD-10-CM | POA: Diagnosis not present

## 2015-12-09 DIAGNOSIS — Z79899 Other long term (current) drug therapy: Secondary | ICD-10-CM | POA: Diagnosis not present

## 2015-12-09 DIAGNOSIS — M25432 Effusion, left wrist: Secondary | ICD-10-CM | POA: Diagnosis not present

## 2015-12-10 DIAGNOSIS — Z79899 Other long term (current) drug therapy: Secondary | ICD-10-CM | POA: Diagnosis not present

## 2015-12-10 DIAGNOSIS — L309 Dermatitis, unspecified: Secondary | ICD-10-CM | POA: Diagnosis not present

## 2015-12-10 DIAGNOSIS — M0609 Rheumatoid arthritis without rheumatoid factor, multiple sites: Secondary | ICD-10-CM | POA: Diagnosis not present

## 2016-01-07 ENCOUNTER — Other Ambulatory Visit: Payer: Self-pay | Admitting: Family Medicine

## 2016-01-07 DIAGNOSIS — I10 Essential (primary) hypertension: Secondary | ICD-10-CM

## 2016-01-16 DIAGNOSIS — R072 Precordial pain: Secondary | ICD-10-CM | POA: Diagnosis not present

## 2016-01-16 DIAGNOSIS — J9811 Atelectasis: Secondary | ICD-10-CM | POA: Diagnosis not present

## 2016-01-16 DIAGNOSIS — R079 Chest pain, unspecified: Secondary | ICD-10-CM | POA: Diagnosis not present

## 2016-01-16 DIAGNOSIS — I10 Essential (primary) hypertension: Secondary | ICD-10-CM | POA: Diagnosis not present

## 2016-01-16 DIAGNOSIS — S299XXA Unspecified injury of thorax, initial encounter: Secondary | ICD-10-CM | POA: Diagnosis not present

## 2016-01-16 DIAGNOSIS — R0789 Other chest pain: Secondary | ICD-10-CM | POA: Diagnosis not present

## 2016-01-16 DIAGNOSIS — F17210 Nicotine dependence, cigarettes, uncomplicated: Secondary | ICD-10-CM | POA: Diagnosis not present

## 2016-02-10 ENCOUNTER — Telehealth: Payer: Self-pay | Admitting: Family Medicine

## 2016-02-10 DIAGNOSIS — M0570 Rheumatoid arthritis with rheumatoid factor of unspecified site without organ or systems involvement: Secondary | ICD-10-CM | POA: Diagnosis not present

## 2016-02-10 DIAGNOSIS — M25441 Effusion, right hand: Secondary | ICD-10-CM | POA: Diagnosis not present

## 2016-02-10 DIAGNOSIS — M25442 Effusion, left hand: Secondary | ICD-10-CM | POA: Diagnosis not present

## 2016-02-10 DIAGNOSIS — M25431 Effusion, right wrist: Secondary | ICD-10-CM | POA: Diagnosis not present

## 2016-02-10 DIAGNOSIS — M25432 Effusion, left wrist: Secondary | ICD-10-CM | POA: Diagnosis not present

## 2016-02-10 DIAGNOSIS — M545 Low back pain: Secondary | ICD-10-CM | POA: Diagnosis not present

## 2016-02-10 NOTE — Telephone Encounter (Signed)
Patient came in to report that methotrexate and hydrochloriquine are giving her reactions. Patient stated that she's experiencing swelling, sores and rash in mouth.   Patient would like advice on what to do

## 2016-02-11 NOTE — Telephone Encounter (Signed)
Just like I have informed her in the past, she will need to inform her rheumatologist who prescribes this medications for her.

## 2016-02-15 DIAGNOSIS — I10 Essential (primary) hypertension: Secondary | ICD-10-CM | POA: Diagnosis not present

## 2016-02-15 DIAGNOSIS — G8929 Other chronic pain: Secondary | ICD-10-CM | POA: Diagnosis not present

## 2016-02-15 DIAGNOSIS — M549 Dorsalgia, unspecified: Secondary | ICD-10-CM | POA: Diagnosis not present

## 2016-02-15 DIAGNOSIS — F17210 Nicotine dependence, cigarettes, uncomplicated: Secondary | ICD-10-CM | POA: Diagnosis not present

## 2016-02-15 DIAGNOSIS — N858 Other specified noninflammatory disorders of uterus: Secondary | ICD-10-CM | POA: Diagnosis not present

## 2016-02-15 DIAGNOSIS — M818 Other osteoporosis without current pathological fracture: Secondary | ICD-10-CM | POA: Diagnosis not present

## 2016-02-15 DIAGNOSIS — M545 Low back pain: Secondary | ICD-10-CM | POA: Diagnosis not present

## 2016-02-15 DIAGNOSIS — Z780 Asymptomatic menopausal state: Secondary | ICD-10-CM | POA: Diagnosis not present

## 2016-02-15 DIAGNOSIS — M47819 Spondylosis without myelopathy or radiculopathy, site unspecified: Secondary | ICD-10-CM | POA: Diagnosis not present

## 2016-02-15 DIAGNOSIS — M546 Pain in thoracic spine: Secondary | ICD-10-CM | POA: Diagnosis not present

## 2016-02-18 ENCOUNTER — Other Ambulatory Visit: Payer: Self-pay | Admitting: Family Medicine

## 2016-02-18 DIAGNOSIS — L304 Erythema intertrigo: Secondary | ICD-10-CM

## 2016-02-18 DIAGNOSIS — R0982 Postnasal drip: Secondary | ICD-10-CM

## 2016-02-24 ENCOUNTER — Telehealth: Payer: Self-pay

## 2016-02-24 ENCOUNTER — Ambulatory Visit: Payer: Medicare Other | Attending: Family Medicine | Admitting: Family Medicine

## 2016-02-24 ENCOUNTER — Encounter: Payer: Self-pay | Admitting: Family Medicine

## 2016-02-24 ENCOUNTER — Encounter: Payer: Self-pay | Admitting: Internal Medicine

## 2016-02-24 VITALS — BP 140/70 | HR 63 | Temp 98.6°F | Ht 62.0 in | Wt 138.4 lb

## 2016-02-24 DIAGNOSIS — R0982 Postnasal drip: Secondary | ICD-10-CM

## 2016-02-24 DIAGNOSIS — Z23 Encounter for immunization: Secondary | ICD-10-CM | POA: Insufficient documentation

## 2016-02-24 DIAGNOSIS — K121 Other forms of stomatitis: Secondary | ICD-10-CM | POA: Insufficient documentation

## 2016-02-24 DIAGNOSIS — M069 Rheumatoid arthritis, unspecified: Secondary | ICD-10-CM | POA: Diagnosis not present

## 2016-02-24 DIAGNOSIS — H538 Other visual disturbances: Secondary | ICD-10-CM | POA: Insufficient documentation

## 2016-02-24 DIAGNOSIS — K5900 Constipation, unspecified: Secondary | ICD-10-CM | POA: Insufficient documentation

## 2016-02-24 DIAGNOSIS — M0579 Rheumatoid arthritis with rheumatoid factor of multiple sites without organ or systems involvement: Secondary | ICD-10-CM | POA: Diagnosis not present

## 2016-02-24 DIAGNOSIS — M546 Pain in thoracic spine: Secondary | ICD-10-CM | POA: Diagnosis not present

## 2016-02-24 DIAGNOSIS — E7800 Pure hypercholesterolemia, unspecified: Secondary | ICD-10-CM

## 2016-02-24 DIAGNOSIS — Z79899 Other long term (current) drug therapy: Secondary | ICD-10-CM

## 2016-02-24 DIAGNOSIS — I10 Essential (primary) hypertension: Secondary | ICD-10-CM | POA: Diagnosis not present

## 2016-02-24 DIAGNOSIS — K5909 Other constipation: Secondary | ICD-10-CM | POA: Diagnosis not present

## 2016-02-24 DIAGNOSIS — G8929 Other chronic pain: Secondary | ICD-10-CM

## 2016-02-24 DIAGNOSIS — M549 Dorsalgia, unspecified: Secondary | ICD-10-CM | POA: Insufficient documentation

## 2016-02-24 DIAGNOSIS — R195 Other fecal abnormalities: Secondary | ICD-10-CM

## 2016-02-24 LAB — CBC WITH DIFFERENTIAL/PLATELET
BASOS ABS: 96 {cells}/uL (ref 0–200)
Basophils Relative: 1 %
EOS PCT: 2 %
Eosinophils Absolute: 192 cells/uL (ref 15–500)
HCT: 37.9 % (ref 35.0–45.0)
HEMOGLOBIN: 12.9 g/dL (ref 11.7–15.5)
Lymphocytes Relative: 22 %
Lymphs Abs: 2112 cells/uL (ref 850–3900)
MCH: 32.9 pg (ref 27.0–33.0)
MCHC: 34 g/dL (ref 32.0–36.0)
MCV: 96.7 fL (ref 80.0–100.0)
MONOS PCT: 1 %
MPV: 10.6 fL (ref 7.5–12.5)
Monocytes Absolute: 96 cells/uL — ABNORMAL LOW (ref 200–950)
NEUTROS PCT: 74 %
Neutro Abs: 7104 cells/uL (ref 1500–7800)
PLATELETS: 322 10*3/uL (ref 140–400)
RBC: 3.92 MIL/uL (ref 3.80–5.10)
RDW: 17.6 % — ABNORMAL HIGH (ref 11.0–15.0)
WBC: 9.6 10*3/uL (ref 3.8–10.8)

## 2016-02-24 LAB — VITAMIN B12: VITAMIN B 12: 1201 pg/mL — AB (ref 200–1100)

## 2016-02-24 MED ORDER — LACTULOSE 10 GM/15ML PO SOLN
10.0000 g | Freq: Three times a day (TID) | ORAL | 2 refills | Status: DC
Start: 2016-02-24 — End: 2016-08-20

## 2016-02-24 MED ORDER — CARVEDILOL 6.25 MG PO TABS: 6.2500 mg | ORAL_TABLET | Freq: Two times a day (BID) | ORAL | 5 refills | Status: DC

## 2016-02-24 MED ORDER — CETIRIZINE HCL 10 MG PO TABS: 10.0000 mg | ORAL_TABLET | Freq: Every day | ORAL | 5 refills | Status: DC

## 2016-02-24 MED ORDER — ACYCLOVIR 400 MG PO TABS: 400.0000 mg | ORAL_TABLET | Freq: Every day | ORAL | 0 refills | Status: DC

## 2016-02-24 MED ORDER — TIZANIDINE HCL 4 MG PO TABS: 4.0000 mg | ORAL_TABLET | Freq: Three times a day (TID) | ORAL | 1 refills | Status: DC | PRN

## 2016-02-24 MED ORDER — LIDOCAINE VISCOUS 2 % MT SOLN: 15.0000 mL | OROMUCOSAL | 0 refills | Status: DC | PRN

## 2016-02-24 MED ORDER — PRAVASTATIN SODIUM 20 MG PO TABS: 20.0000 mg | ORAL_TABLET | Freq: Every day | ORAL | 5 refills | Status: DC

## 2016-02-24 NOTE — Patient Instructions (Signed)
Stomatitis Stomatitis is a condition that causes inflammation in your mouth. It can affect a part of your mouth or your whole mouth. The condition often affects your cheek, teeth, gums, lips, and tongue. Stomatitis can also affect the mucous membranes that surround your mouth (mucosa). Pain from stomatitis can make it hard for you to eat or drink. Severe cases of this condition can lead to dehydration or poor nutrition. CAUSES Common causes of this condition include:  Viruses, such as cold sores or oral herpes and shingles.  Canker sores.  Bacterial infections.  Fungus or yeast infections, such as oral thrush.  Not getting adequate nutrition.  Injury to your mouth. This can be from:  Dentures or braces that do not fit well.  Biting your tongue or cheek.  Burning your mouth.  Having sharp or broken teeth.  Gum disease.  Using tobacco, especially chewing tobacco.  Allergies to foods, medicines, or substances that are used in your mouth.  Medicines, including cancer medicines (chemotherapy), antihistamines, and seizure medicines. In some cases, the cause may not be known. RISK FACTORS This condition is more likely to develop in people who:  Have poor oral hygiene or poor nutrition.  Have any condition that causes a dry mouth.  Are under a lot of physical or emotional stress.  Have any condition that weakens the body's defense system (immune system).  Are being treated for cancer.  Smoke. SYMPTOMS The most common symptoms of this condition are pain, swelling, and redness inside your mouth. The pain may feel like burning or stinging. It may get worse from eating or drinking. Other symptoms include:  Painful, shallow sores (ulcers) in the mouth.  Blisters in the mouth.  Bleeding gums.  Swollen gums.  Irritability and fatigue.  Bad breath.  Bad taste in the mouth.  Fever. DIAGNOSIS This condition is diagnosed with a physical exam to check for bleeding gums  and mouth ulcers. You may also have other tests, including:  Blood tests to look for infection or vitamin deficiencies.  Mouth swab to get a fluid sample to test for bacteria (culture).  Tissue sample from an ulcer to examine under a microscope (biopsy). TREATMENT Treatment for stomatitis depends on the cause. Treatment may include medicines, such as:  Over-the counter (OTC) pain medicines.  Topical anesthetic to numb the area if you have severe pain.  Antibiotics to treat a bacterial infection.  Antifungals to treat a fungal infection.  Antivirals to treat a viral infection.  Mouth rinses that contain steroids to reduce the swelling in your mouth.  Other medicines to coat or numb your mouth. HOME CARE INSTRUCTIONS Medicines  Take medicines only as directed by your health care provider.  If you were prescribed an antibiotic, finish all of it even if you start to feel better. Lifestyle  Practice good oral hygiene:  Gently brush your teeth with a soft, nylon-bristled toothbrush two times each day.  Floss your teeth every day.  Have your teeth cleaned regularly, as recommended by your dentist.  Eat a balanced diet.Do not eat:  Spicy foods.  Citrus, such as oranges.  Foods that have sharp edges, such as chips.  Avoid any foods or other allergens that you think may be causing your stomatitis.  If you have dentures, make sure that they are properly fitted.  Do not use any tobacco products, including cigarettes, chewing tobacco, or electronic cigarettes. If you need help quitting, ask your health care provider.  Find ways to reduce stress. Try yoga  or meditation. Ask your health care provider for other ideas. General Instructions  Use a salt-water rinse for pain as directed by your health care provider. Mix 1 tsp of salt in 2 cups of water.  Drink enough fluid to keep your urine clear or pale yellow. This will keep you hydrated. SEEK MEDICAL CARE IF:  Your  symptoms get worse.  You develop new symptoms, especially:  A rash.  New symptoms that do not involve your mouth area.  Your symptoms last longer than three weeks.  Your stomatitis goes away and then returns.  You have a harder time eating and drinking normally.  You have increasing fatigue or weakness.  You lose your appetite or you feel nauseous.  You have a fever.   This information is not intended to replace advice given to you by your health care provider. Make sure you discuss any questions you have with your health care provider.   Document Released: 03/08/2007 Document Revised: 09/25/2014 Document Reviewed: 05/07/2014 Elsevier Interactive Patient Education [employer].

## 2016-02-24 NOTE — Progress Notes (Signed)
Dark black stool- constipation Sores on and in her mouth After January she wont be seeing her regular rheamatologist

## 2016-02-24 NOTE — Telephone Encounter (Signed)
Met with the patient and her grandson, Decario Butrick to discuss transportation services at the request of Dr Amao.  Decario explained that the patient receives a home health aide 1.5 hours x 3 days a week but he would like information about services available in the community that would offer the her opportunity for more socialization. Explained that medicaid transportation would provide transportation to her medical appointments if she qualified. He said that he thinks she may have contacted medicaid but provided him with the # [phone] and encouraged him to call again to to inquire if she is registered.  Also provided him with the phone # for [facility] in [place] # [phone] to inquire about meals on wheels as well as the phone # for [facility] in [place] # [phone] and provided him with an application for [employer] transportation. He said that he would complete the application for the patient and return to [facility] for Dr Amao to sign.   As per EPIC, she does not have [place] [employer] medicaid to be eligible for [facility] services.    

## 2016-02-25 ENCOUNTER — Telehealth: Payer: Self-pay

## 2016-02-25 NOTE — Progress Notes (Signed)
Subjective:  Patient ID: Kristy Oliver, female    DOB: 10-03-1945  Age: 70 y.o. MRN: [id number]  CC: Hypertension and Back Pain   HPI Kristy Oliver s a 70 year old female with a history of rheumatoid arthritis, hypertension, hyperlipidemia who comes in for a follow-up visit.  Her hypertension is controlled and she has been compliant with her antihypertensive; remains on statin even though lipids have been elevated.  Currently sees rheumatology but is not satisfied with her care; has been informed that her rheumatologist no longer takes Medicaid and she is needing a referral for a new rheumatologist. She continues to have ulcers in her mouth which are painful and I had advised her to discuss this with her rheumatologist at her last visit but symptoms have not improved. This prevents her from eating and drinking.  Also complains of constipation and has dark red blood in her stool. Denies abdominal pain and has not had a colonoscopy.  Continues to have thoracic back pain which prevents her from getting out of the bed; she has kyphosis in her thoracic spine and states bending forward. Has an appointment with pain management next week. Would like to be referred to ophthalmology as she states her vision has worsened ever since she was placed on Plaquenil.  Accompanied by her grandson who is not happy that his grandma does not feel well; they are seeking assistance with transportation. All of the above symptoms also make her depressed evidenced by her pH Q9 score was in the fact that she sometimes feels she would be better off not being alive. Case manager called in to assist with transportation; case management to contact the patient to discuss depression and counseling.  Past Medical History:  Diagnosis Date  . Arthritis   . Gout   . Hypertension   . Renal disorder     Past Surgical History:  Procedure Laterality Date  . BREAST LUMPECTOMY    . c-section x 2    . JOINT REPLACEMENT      left knee & left shoulder  . left breast cyst removal       Outpatient Medications Prior to Visit  Medication Sig Dispense Refill  . clotrimazole (LOTRIMIN) 1 % cream APPLY TOPICALLY TWICE DAILY 30 g 1  . folic acid (FOLVITE) 1 MG tablet Take 1 mg by mouth daily. Reported on 11/15/2015    . hydroxychloroquine (PLAQUENIL) 200 MG tablet Take 400 mg by mouth daily. Reported on 11/15/2015    . methotrexate (RHEUMATREX) 2.5 MG tablet Take 12.5 mg by mouth 2 (two) times a week. Methotrexate 2.5mg , 5 tablets on Wednesday and Thrusday    . terbinafine (LAMISIL AT) 1 % cream Apply 1 application topically 2 (two) times daily. 30 g 2  . carvedilol (COREG) 6.25 MG tablet TAKE 1  TABLET BY MOUTH TWICE DAILY 60 tablet 2  . cetirizine (ZYRTEC) 10 MG tablet TAKE 1 TABLET BY MOUTH EVERY DAY 30 tablet 3  . predniSONE (DELTASONE) 10 MG tablet Take 10 mg by mouth daily with breakfast.    . colchicine 0.6 MG tablet Take 1 tablet (0.6 mg total) by mouth daily. (Patient not taking: Reported on 02/24/2016) 30 tablet 3  . dronabinol (MARINOL) 2.5 MG capsule Take 1 capsule (2.5 mg total) by mouth 2 (two) times daily before lunch and supper. (Patient not taking: Reported on 02/24/2016) 60 capsule 3  . folic acid (FOLVITE) A999333 MCG tablet Take 400 mcg by mouth daily.    Marland Kitchen lactulose (CHRONULAC) 10 GM/15ML  solution Take 15 mLs (10 g total) by mouth 3 (three) times daily. (Patient not taking: Reported on 02/24/2016) 946 mL 2  . methylPREDNISolone (MEDROL DOSEPAK) 4 MG tablet follow package directions (Patient not taking: Reported on 02/24/2016) 21 tablet 0  . pravastatin (PRAVACHOL) 20 MG tablet Take 1 tablet (20 mg total) by mouth daily. (Patient not taking: Reported on 02/24/2016) 30 tablet 3   No facility-administered medications prior to visit.     ROS Review of Systems  Constitutional: Negative for activity change, appetite change and fatigue.  HENT: Positive for mouth sores. Negative for congestion, sinus pressure and  sore throat.   Eyes: Positive for visual disturbance.  Respiratory: Negative for cough, chest tightness, shortness of breath and wheezing.   Cardiovascular: Negative for chest pain and palpitations.  Gastrointestinal: Positive for blood in stool and constipation. Negative for abdominal distention and abdominal pain.  Endocrine: Negative for polydipsia.  Genitourinary: Negative for dysuria and frequency.  Musculoskeletal: Positive for back pain. Negative for arthralgias.  Skin: Negative for rash.  Neurological: Negative for tremors, light-headedness and numbness.  Hematological: Does not bruise/bleed easily.  Psychiatric/Behavioral: Negative for agitation and behavioral problems.    Objective:  BP 140/70 (BP Location: Right Arm, Patient Position: Sitting, Cuff Size: Small)   Pulse 63   Temp 98.6 F (37 C) (Oral)   Ht 5\' 2"  (1.575 m)   Wt 138 lb 6.4 oz (62.8 kg)   SpO2 96%   BMI 25.31 kg/m   BP/Weight 02/24/2016 [id number] 0000000  Systolic BP [id number]  Diastolic BP 70 80 82  Wt. (Lbs) 138.4 149 151.4  BMI 25.31 27.25 27.68      Physical Exam Constitutional: She is oriented to person, place, and time. She appears well-developed and well-nourished.  HEENT: ulcerations on buccal mucosa and angle of the mouth Cardiovascular: Normal rate, normal heart sounds and intact distal pulses.   No murmur heard. Pulmonary/Chest: Effort normal and breath sounds normal. She has no wheezes. She has no rales. She exhibits no tenderness.  Abdominal: Soft. Bowel sounds are normal. She exhibits no distension and no mass. There is no tenderness.  Musculoskeletal:  Multiple rheumatoid nodules in hands and elbows.  Kyphosis of thoracic spine Skin: Intertrigo rash with hyperpigmentation in inframammary region and erythematous scabs in between breasts Neurological: She is alert and oriented to person, place, and time.   Lipid Panel     Component Value Date/Time   CHOL 250 (H) 07/02/2015 0915     TRIG 102 07/02/2015 0915   HDL 96 07/02/2015 0915   CHOLHDL 2.6 07/02/2015 0915   VLDL 20 07/02/2015 0915   LDLCALC 134 (H) 07/02/2015 0915    Assessment & Plan:   1. Other constipation - lactulose (CHRONULAC) 10 GM/15ML solution; Take 15 mLs (10 g total) by mouth 3 (three) times daily.  Dispense: 946 mL; Refill: 2  2. Essential hypertension Controlled - carvedilol (COREG) 6.25 MG tablet; Take 1 tablet (6.25 mg total) by mouth 2 (two) times daily.  Dispense: 60 tablet; Refill: 5  3. Post-nasal drip Stable - cetirizine (ZYRTEC) 10 MG tablet; Take 1 tablet (10 mg total) by mouth daily.  Dispense: 30 tablet; Refill: 5  4. Pure hypercholesterolemia Uncontrolled Low-cholesterol, DASH diet - pravastatin (PRAVACHOL) 20 MG tablet; Take 1 tablet (20 mg total) by mouth daily.  Dispense: 30 tablet; Refill: 5  5. Dark red stool - Ambulatory referral to Gastroenterology - CBC with Differential/Platelet  6. Stomatitis Could be secondary to systemic effects  of rheumatoid vs side effects of plaquenil vs gingivostomatitis - lidocaine (XYLOCAINE) 2 % solution; Use as directed 15 mLs in the mouth or throat as needed for mouth pain.  Dispense: 100 mL; Refill: 0 - Vitamin B12 - acyclovir (ZOVIRAX) 400 MG tablet; Take 1 tablet (400 mg total) by mouth 5 (five) times daily.  Dispense: 35 tablet; Refill: 0  7. Rheumatoid arthritis involving multiple sites with positive rheumatoid factor (Ethel) - Ambulatory referral to Ophthalmology - Ambulatory referral to Rheumatology - Vitamin B12  8. Chronic midline thoracic back pain Has an upcoming appointment with pain clinic - tiZANidine (ZANAFLEX) 4 MG tablet; Take 1 tablet (4 mg total) by mouth every 8 (eight) hours as needed for muscle spasms.  Dispense: 90 tablet; Refill: 1  9. Blurry vision, bilateral - Ambulatory referral to Ophthalmology  10. High risk medication use - Vitamin B12  11. Encounter for immunization - Flu Vaccine QUAD 36+ mos  IM   Meds ordered this encounter  Medications  . lactulose (CHRONULAC) 10 GM/15ML solution    Sig: Take 15 mLs (10 g total) by mouth 3 (three) times daily.    Dispense:  946 mL    Refill:  2  . carvedilol (COREG) 6.25 MG tablet    Sig: Take 1 tablet (6.25 mg total) by mouth 2 (two) times daily.    Dispense:  60 tablet    Refill:  5  . cetirizine (ZYRTEC) 10 MG tablet    Sig: Take 1 tablet (10 mg total) by mouth daily.    Dispense:  30 tablet    Refill:  5  . pravastatin (PRAVACHOL) 20 MG tablet    Sig: Take 1 tablet (20 mg total) by mouth daily.    Dispense:  30 tablet    Refill:  5  . lidocaine (XYLOCAINE) 2 % solution    Sig: Use as directed 15 mLs in the mouth or throat as needed for mouth pain.    Dispense:  100 mL    Refill:  0  . tiZANidine (ZANAFLEX) 4 MG tablet    Sig: Take 1 tablet (4 mg total) by mouth every 8 (eight) hours as needed for muscle spasms.    Dispense:  90 tablet    Refill:  1  . acyclovir (ZOVIRAX) 400 MG tablet    Sig: Take 1 tablet (400 mg total) by mouth 5 (five) times daily.    Dispense:  35 tablet    Refill:  0    Follow-up: Return in about 3 months (around 05/26/2016), or if symptoms worsen or fail to improve, for Follow-up on chronic medical conditions.   Arnoldo Morale MD

## 2016-02-25 NOTE — Telephone Encounter (Signed)
Writer spoke with patient's grandson regarding her lab results per Dr. Jarold Song  He stated understanding and will tell patient.

## 2016-02-25 NOTE — Telephone Encounter (Signed)
-----   Message from Arnoldo Morale, MD sent at 02/25/2016  9:34 AM EDT ----- Labs are negative for anemia and she is not vitamin B12 deficient

## 2016-03-06 ENCOUNTER — Telehealth: Payer: Self-pay | Admitting: Licensed Clinical Social Worker

## 2016-03-06 NOTE — Telephone Encounter (Signed)
03/06/2016 4:58 PM  LCSWA contacted pt via telephone. LCSWA introduced self and explained role at [place] disclosed that she received a consult from Dr. Jarold Song to address depression and anxiety.  Pt reported that she has never been diagnosed with any mental health disorder. She reports feeling stressed "sometimes" from staying to herself. LCSWA educated pt on the cycle of depression and discussed different activities pt can participate in to decrease symptoms.  LCSWA discussed community resources to assist pt with increased social interaction. Pt asked LCSWA to relay information to grandson, [employer]. LCSWA left a detailed message for Mr. Farrey to contact her for resource information.   Plan of Action: LCSWA will make another attempt to contact pt's grandson to follow up on [employer] application provided at last medical visit, in addition, to providing additional resources for pt.   Christa See, MSW, LCSWA 03/06/16 5:15 PM

## 2016-03-06 NOTE — Telephone Encounter (Signed)
02/25/2016 5:15 PM  LCSWA made attempt to speak with pt, per request by Dr. Jarold Song. Pt was unavailable but LCSWA got in contact with grandson.  LCSWA introduced self and explained role at [facility] Kristy Oliver reported that he received a SCAT application for pt at last medical visit and plans to complete packet by the end of the week. Kristy Oliver is interested in resources to assist pt in becoming more active in the community. LCSWA provided resources.   Plan of Action: LCSWA will make another attempt to address mental health with pt and follow-up on provided resources.   Christa See, MSW, LCSWA 03/06/16 5:20 PM

## 2016-03-17 ENCOUNTER — Ambulatory Visit: Payer: Self-pay | Admitting: Internal Medicine

## 2016-03-17 ENCOUNTER — Telehealth: Payer: Self-pay | Admitting: Internal Medicine

## 2016-03-17 NOTE — Telephone Encounter (Signed)
No charge. 

## 2016-03-26 DIAGNOSIS — M549 Dorsalgia, unspecified: Secondary | ICD-10-CM | POA: Diagnosis not present

## 2016-03-26 DIAGNOSIS — M47816 Spondylosis without myelopathy or radiculopathy, lumbar region: Secondary | ICD-10-CM | POA: Diagnosis not present

## 2016-03-27 ENCOUNTER — Other Ambulatory Visit: Payer: Self-pay | Admitting: Family Medicine

## 2016-03-27 DIAGNOSIS — K121 Other forms of stomatitis: Secondary | ICD-10-CM

## 2016-03-27 DIAGNOSIS — M546 Pain in thoracic spine: Principal | ICD-10-CM

## 2016-03-27 DIAGNOSIS — G8929 Other chronic pain: Secondary | ICD-10-CM

## 2016-04-06 DIAGNOSIS — R079 Chest pain, unspecified: Secondary | ICD-10-CM | POA: Diagnosis not present

## 2016-04-06 DIAGNOSIS — F17210 Nicotine dependence, cigarettes, uncomplicated: Secondary | ICD-10-CM | POA: Diagnosis not present

## 2016-04-06 DIAGNOSIS — R0789 Other chest pain: Secondary | ICD-10-CM | POA: Diagnosis not present

## 2016-04-06 DIAGNOSIS — R05 Cough: Secondary | ICD-10-CM | POA: Diagnosis not present

## 2016-04-06 DIAGNOSIS — R0602 Shortness of breath: Secondary | ICD-10-CM | POA: Diagnosis not present

## 2016-04-29 ENCOUNTER — Other Ambulatory Visit: Payer: Self-pay | Admitting: Internal Medicine

## 2016-04-29 ENCOUNTER — Other Ambulatory Visit: Payer: Self-pay | Admitting: Family Medicine

## 2016-04-29 DIAGNOSIS — L304 Erythema intertrigo: Secondary | ICD-10-CM

## 2016-04-29 DIAGNOSIS — M546 Pain in thoracic spine: Principal | ICD-10-CM

## 2016-04-29 DIAGNOSIS — G8929 Other chronic pain: Secondary | ICD-10-CM

## 2016-04-29 DIAGNOSIS — K121 Other forms of stomatitis: Secondary | ICD-10-CM

## 2016-05-08 DIAGNOSIS — Z79899 Other long term (current) drug therapy: Secondary | ICD-10-CM | POA: Insufficient documentation

## 2016-05-08 DIAGNOSIS — Z96612 Presence of left artificial shoulder joint: Secondary | ICD-10-CM | POA: Insufficient documentation

## 2016-05-08 NOTE — Progress Notes (Signed)
Office Visit Note  Patient: Kristy Oliver             Date of Birth: Feb 27, 1946           MRN: [id number]             PCP: Arnoldo Morale, MD Referring: Arnoldo Morale, MD Visit Date: 05/11/2016 Occupation: Retired    Subjective:  Pain hands   History of Present Illness: Kristy Oliver is a 70 y.o. female with history of rheumatoid arthritis seen in consultation per request of Dr. Charlane Ferretti. According to patient she was diagnosed with rheumatoid arthritis when she was 70 years old at the time she developed difficulty walking and nodules on her feet. She was seen by her rheumatologist in [place] she states at the time she did not receive any treatment except for treatment for asthma which was mostly with inhalers. In 2014 she moved to [facility] and was under care of Dr. Charlestine Night. She was started on methotrexate and prednisone and later Plaquenil was added. She states the medications helped her initially and then they quit working. She was also given a cortisone injection which helped her for short time. She continues to have a lot of pain and stiffness she describes discomfort in her bilateral hands bilateral wrist joints bilateral knee joints and her feet. She is also suffering from some mid back pain. She reports that she had 3 episodes of renal failure in her life, for which they she has been hospitalized each time. She states her asthma is fairly well controlled. She has history of chronic smoking she has cut down her smoking to 4 cigarettes a day. She still drinks alcohol only on occasions.  Activities of Daily Living:  Patient reports morning stiffness for all day hours.   Patient Reports nocturnal pain.  Difficulty dressing/grooming: Reports Difficulty climbing stairs: Denies Difficulty getting out of chair: Reports Difficulty using hands for taps, buttons, cutlery, and/or writing: Reports   Review of Systems  Constitutional: Positive for fatigue. Negative for night sweats,  weight gain, weight loss and weakness.  HENT: Negative for mouth sores, trouble swallowing, trouble swallowing, mouth dryness and nose dryness.   Eyes: Negative for pain, redness, visual disturbance and dryness.  Respiratory: Positive for shortness of breath. Negative for cough and difficulty breathing.   Cardiovascular: Positive for palpitations. Negative for chest pain, hypertension, irregular heartbeat and swelling in legs/feet.  Gastrointestinal: Negative for blood in stool, constipation and diarrhea.  Endocrine: Negative for increased urination.  Genitourinary: Negative for vaginal dryness.  Musculoskeletal: Positive for arthralgias, joint pain, joint swelling and morning stiffness. Negative for myalgias, muscle weakness, muscle tenderness and myalgias.  Skin: Negative for color change, rash, hair loss, skin tightness, ulcers and sensitivity to sunlight.  Allergic/Immunologic: Negative for susceptible to infections.  Neurological: Negative for dizziness, memory loss and night sweats.  Hematological: Negative for swollen glands.  Psychiatric/Behavioral: Positive for depressed mood and sleep disturbance. The patient is not nervous/anxious.     PMFS History:  Patient Active Problem List   Diagnosis Date Noted  . Moderate asthma without complication 08/65/7846  . High risk medication use 05/08/2016  . History of left shoulder replacement 05/08/2016  . Back pain 02/24/2016  . Stomatitis 09/23/2015  . Tinea pedis 09/23/2015  . Depression 09/23/2015  . Hyperlipidemia 07/04/2015  . Post-nasal drip 06/27/2015  . HTN (hypertension) 10/12/2013  . Smoker 10/12/2013  . Bloating symptom 10/12/2013  . Gastroenteritis 07/02/2013  . Protein-calorie malnutrition, severe ([place]) 07/01/2013  . AKI (  acute kidney injury) ([place]) 06/30/2013  . Dehydration 06/30/2013  . RA (rheumatoid arthritis) ([place]) 06/30/2013  . Enteritis due to Clostridium difficile 06/30/2013  . Tobacco use 06/30/2013  . Facial  cellulitis 11/17/2012  . Poor dentition 11/17/2012  . HTN (hypertension), malignant 11/17/2012    Past Medical History:  Diagnosis Date  . Arthritis   . Gout   . Hypertension   . Renal disorder     No family history on file. Past Surgical History:  Procedure Laterality Date  . BREAST LUMPECTOMY    . c-section x 2    . JOINT REPLACEMENT     left knee & left shoulder  . left breast cyst removal     Social History   Social History Narrative  . No narrative on file     Objective: Vital Signs: BP 120/76   Pulse 76   Ht 5' 2"  (1.575 m)   Wt 139 lb (63 kg)   BMI 25.42 kg/m    Physical Exam  Constitutional: She is oriented to person, place, and time. She appears well-developed and well-nourished.  HENT:  Head: Normocephalic and atraumatic.  Eyes: Conjunctivae and EOM are normal.  Neck: Normal range of motion.  Cardiovascular: Normal rate, regular rhythm, normal heart sounds and intact distal pulses.   Pulmonary/Chest: Effort normal and breath sounds normal.  Abdominal: Soft. Bowel sounds are normal.  Lymphadenopathy:    She has no cervical adenopathy.  Neurological: She is alert and oriented to person, place, and time.  Skin: Skin is warm and dry. Capillary refill takes less than 2 seconds.  Psychiatric: She has a normal mood and affect. Her behavior is normal.  Nursing note and vitals reviewed.    Musculoskeletal Exam: Thoracic kyphosis with tenderness over T6-T7. Left shoulder joint abduction limited to 90. Elbow joints with full range of motion with nodulosis. She has very limited range of motion of her bilateral wrist joints with synovitis and synovial thickening. She has synovial thickening and subluxation of most of her MCP joints. She has contractures of several of her PIP joints. Hip joints with good range of motion knee joints are good range of motion with synovial thickening. Ankle joints has limited range of motion she has tenderness or MTP joints with  hammertoes bilaterally.  CDAI Exam: CDAI Homunculus Exam:   Tenderness:  RUE: wrist LUE: acromioclavicular and wrist Right hand: 1st MCP, 2nd MCP, 3rd MCP, 4th MCP, 5th MCP, 2nd PIP, 3rd PIP, 4th PIP and 5th PIP Left hand: 1st MCP, 2nd MCP, 3rd MCP, 4th MCP, 5th MCP, 2nd PIP, 3rd PIP, 4th PIP and 5th PIP RLE: tibiofemoral LLE: tibiofemoral Right foot: 1st MTP, 2nd MTP, 3rd MTP, 4th MTP and 5th MTP Left foot: 1st MTP, 2nd MTP, 3rd MTP, 4th MTP and 5th MTP  Swelling:  RUE: wrist LUE: wrist Right hand: 2nd MCP Left hand: 2nd MCP RLE: tibiofemoral  Joint Counts:  CDAI Tender Joint count: 22 CDAI Swollen Joint count: 5  Global Assessments:  Patient Global Assessment: 8 Provider Global Assessment: 8  CDAI Calculated Score: 43    Investigation: Findings:  Record review from Dr. Elmon Else office CBC 12/10/2015 hemoglobin 12.2 CMP AST 30 ALT 26 creatinine 0.9, febrile 24 2016 ESR 74  Chart review January 2016 rheumatid factor 121, uric acid 6.6, August 2014: CCP > 300, ESR 55, 02/24/2016 CBC normal    Imaging: Xr Knee 1-2 Views Left  Result Date: 05/11/2016 Moderate medial and lateral compartment narrowing with lateral osteophytes. Moderate patellofemoral  narrowing. Impression: These findings are consistent with inflammatory arthritis of the knee joint with moderate joint space narrowing and chondromalacia patella  Xr Thoracic Spine 2 View  Result Date: 05/11/2016 Severe thoracic kyphosis with the severe compression fractures at multiple levels. Impression: Severe thoracic kyphosis with compression fractures and osteopenia  Xr Foot 2 Views Left  Result Date: 05/11/2016 All MTP PIP/DIP narrowing with erosive changes in the most of her MTP joints and intertarsal joints. Generalized osteopenia. Impression: these findings are consistent with inflammatory erosive disease rheumatoid arthritis  Xr Foot 2 Views Right  Result Date: 05/11/2016 All MTP narrowing with erosive  changes and subluxation.. All PIP and DIP narrowing with subluxation. Juxta articular osteopenia noted. Impression: These findings are consistent with rheumatoid arthritis inflammatory erosive disease  Xr Hand 2 View Left  Result Date: 05/11/2016 Severe erosive changes in intercarpal joints with intercarpal joint space narrowing. She has all MCP, PIP and DIP narrowing. She is invagination of her right second MCP narrowing. She has left first PIP subluxation noted erosive change. Juxta articular osteopenia noted. Impression: These findings are consistent with severe rheumatoid arthritis with erosive changes.  Xr Hand 2 View Right  Result Date: 05/11/2016 All MCP, PIP and DIP narrowing. Right first PIP erosive changes and subluxation, right second MCP erosive change. Intercarpal joints almost fused with severe erosive changes. Erosive change of ulnar styloid. Juxta articular osteopenia. Impression: Severe arthritis with erosive changes and joint space narrowing consistent with rheumatoid arthritis.  Xr Knee 1-2 Views Right  Result Date: 05/11/2016 Moderate medial and lateral compartment narrowing with lateral osteophytes. Moderate patellofemoral narrowing. Impression: These findings are consistent with inflammatory arthritis of the knee joint with moderate joint space narrowing and chondromalacia patella   Speciality Comments: No specialty comments available.    Procedures:  No procedures performed Allergies: Iodinated diagnostic agents; Motrin [ibuprofen]; Milk-related compounds; and Shrimp [shellfish allergy]   Assessment / Plan:     Visit Diagnoses: Rheumatoid arthritis involving multiple sites with positive rheumatoid factor (HCC) -severe rheumatoid arthritis for multiple years. She's been treated with methotrexate, Plaquenil, prednisone for the last 4 years she has multiple contractures nodulosis to the Vitas and synovial thickening she continues to have pain in her bilateral hands  bilateral feet and knee joints. I'll obtain following x-rays today we also discussed different treatment options and their side effects. I will obtain labs today in case we can use Biologics in future. Plan: XR Foot 2 Views Left, XR Foot 2 Views Right, XR Hand 2 View Left, XR Hand 2 View Right. X-rays of her hands and feet reveals severe end-stage rheumatoid arthritis with joint space narrowing subluxation erosive changes.  Rheumatoid nodulosis: She has multiple nodules on elbows, hands and feet  Thoracic pain: She has severe thoracic kyphosis. I'll obtain x-ray of her thoracic spine today. Which revealed severe thoracic kyphosis and multiple compression fractures. I will schedule MRI of her thoracic spine to evaluate this further.  High risk medication use - Methotrexate 20 mg per week folic acid 295 g daily Plaquenil 200 mg twice a day prednisone 10 mg daily, eye exam Dr. Zenia Resides office 10/23/2015 normal  Essential hypertension: Appears to be well controlled  Mixed hyperlipidemia: Followed by PCP  Tobacco use: Chronic tobacco use and chronic bronchitis  Episode of recurrent major depressive disorder, unspecified depression episode severity ([place]): Fairly well controlled per patient  History of left shoulder replacement - 2010: Limited range of motion  Moderate asthma   Pain in thoracic spine - Plan:  XR Thoracic Spine 2 View  Chronic pain of left knee - Plan: XR Knee 1-2 Views Left  Chronic pain of right knee - Plan: XR Knee 1-2 Views Right   Her x-rays show severe bone thinning. She has multiple compression fractures in her thoracic spine. She needs bone density. I've advised her to schedule it through her PCP and she may need treatment for osteoporosis as well.   Orders: Orders Placed This Encounter  Procedures  . XR Foot 2 Views Left  . XR Foot 2 Views Right  . XR Hand 2 View Left  . XR Hand 2 View Right  . XR Thoracic Spine 2 View  . XR Knee 1-2 Views Left  . XR Knee 1-2  Views Right  . CBC with Differential/Platelet  . COMPLETE METABOLIC PANEL WITH GFR  . Sedimentation rate  . Quantiferon tb gold assay (blood)  . HIV antibody  . Protein Electrophoresis, (serum)  . IgG, IgA, IgM  . Urinalysis, Routine w reflex microscopic  . Hepatitis B Surface AntiGEN  . Hepatitis B Core Antibody, IgM  . Hepatitis A antibody, IgM  . Hepatitis C Antibody   No orders of the defined types were placed in this encounter.   Face-to-face time spent with patient was 90 miinutes. 50% of time was spent in counseling and coordination of care.  Follow-Up Instructions: Return for Rheumatoid arthritis.   Bo Merino, MD

## 2016-05-11 ENCOUNTER — Ambulatory Visit (INDEPENDENT_AMBULATORY_CARE_PROVIDER_SITE_OTHER): Payer: Medicare Other

## 2016-05-11 ENCOUNTER — Ambulatory Visit (INDEPENDENT_AMBULATORY_CARE_PROVIDER_SITE_OTHER): Payer: Medicare Other | Admitting: Rheumatology

## 2016-05-11 ENCOUNTER — Encounter: Payer: Self-pay | Admitting: Rheumatology

## 2016-05-11 ENCOUNTER — Ambulatory Visit (INDEPENDENT_AMBULATORY_CARE_PROVIDER_SITE_OTHER): Payer: Self-pay

## 2016-05-11 VITALS — BP 120/76 | HR 76 | Ht 62.0 in | Wt 139.0 lb

## 2016-05-11 DIAGNOSIS — M25562 Pain in left knee: Secondary | ICD-10-CM | POA: Diagnosis not present

## 2016-05-11 DIAGNOSIS — M0579 Rheumatoid arthritis with rheumatoid factor of multiple sites without organ or systems involvement: Secondary | ICD-10-CM | POA: Diagnosis not present

## 2016-05-11 DIAGNOSIS — Z96612 Presence of left artificial shoulder joint: Secondary | ICD-10-CM

## 2016-05-11 DIAGNOSIS — Z1159 Encounter for screening for other viral diseases: Secondary | ICD-10-CM

## 2016-05-11 DIAGNOSIS — G8929 Other chronic pain: Secondary | ICD-10-CM

## 2016-05-11 DIAGNOSIS — F339 Major depressive disorder, recurrent, unspecified: Secondary | ICD-10-CM

## 2016-05-11 DIAGNOSIS — Z720 Tobacco use: Secondary | ICD-10-CM | POA: Diagnosis not present

## 2016-05-11 DIAGNOSIS — R634 Abnormal weight loss: Secondary | ICD-10-CM

## 2016-05-11 DIAGNOSIS — Z9225 Personal history of immunosupression therapy: Secondary | ICD-10-CM | POA: Diagnosis not present

## 2016-05-11 DIAGNOSIS — Z114 Encounter for screening for human immunodeficiency virus [HIV]: Secondary | ICD-10-CM

## 2016-05-11 DIAGNOSIS — M25561 Pain in right knee: Secondary | ICD-10-CM | POA: Diagnosis not present

## 2016-05-11 DIAGNOSIS — M546 Pain in thoracic spine: Secondary | ICD-10-CM | POA: Diagnosis not present

## 2016-05-11 DIAGNOSIS — Z111 Encounter for screening for respiratory tuberculosis: Secondary | ICD-10-CM

## 2016-05-11 DIAGNOSIS — J45909 Unspecified asthma, uncomplicated: Secondary | ICD-10-CM

## 2016-05-11 DIAGNOSIS — E782 Mixed hyperlipidemia: Secondary | ICD-10-CM

## 2016-05-11 DIAGNOSIS — I10 Essential (primary) hypertension: Secondary | ICD-10-CM | POA: Diagnosis not present

## 2016-05-11 DIAGNOSIS — Z79899 Other long term (current) drug therapy: Secondary | ICD-10-CM

## 2016-05-11 NOTE — Patient Instructions (Signed)
Discuss your bone density with your primary care your prednisone use puts you at high risk for osteoporosis . You may need a bone density study

## 2016-05-11 NOTE — Progress Notes (Signed)
Plan per Dr. Estanislado Pandy is to consider biologics in the future.  Reviewed patient's insurance.  She has medicare and medicaid.  Reviewed patient's insurance plan using [email or web address]  It appears Humira and Cimzia are the two injectable TNF inhibitors on patient's formulary (Enbrel and Simponi are not on formulary).  Discussed with Dr. Estanislado Pandy and provided patient with educational information on Humira for her to read prior to her next visit.     Elisabeth Most, Pharm.D., BCPS Clinical Pharmacist Pager: [phone] Phone: [phone] 05/11/2016 12:03 PM

## 2016-05-12 LAB — COMPLETE METABOLIC PANEL WITH GFR
ALBUMIN: 3.8 g/dL (ref 3.6–5.1)
ALK PHOS: 256 U/L — AB (ref 33–130)
ALT: 16 U/L (ref 6–29)
AST: 27 U/L (ref 10–35)
BILIRUBIN TOTAL: 0.4 mg/dL (ref 0.2–1.2)
BUN: 12 mg/dL (ref 7–25)
CALCIUM: 9.8 mg/dL (ref 8.6–10.4)
CO2: 26 mmol/L (ref 20–31)
CREATININE: 0.87 mg/dL (ref 0.50–0.99)
Chloride: 106 mmol/L (ref 98–110)
GFR, EST AFRICAN AMERICAN: 80 mL/min (ref >=60)
GFR, Est Non African American: 69 mL/min (ref >=60)
Glucose, Bld: 81 mg/dL (ref 65–99)
Potassium: 4.6 mmol/L (ref 3.5–5.3)
Sodium: 141 mmol/L (ref 135–146)
Total Protein: 8.1 g/dL (ref 6.1–8.1)

## 2016-05-12 LAB — CBC WITH DIFFERENTIAL/PLATELET
BASOS PCT: 1 %
Basophils Absolute: 133 cells/uL (ref 0–200)
EOS ABS: 532 {cells}/uL — AB (ref 15–500)
Eosinophils Relative: 4 %
HEMATOCRIT: 40.1 % (ref 35.0–45.0)
HEMOGLOBIN: 13.1 g/dL (ref 11.7–15.5)
LYMPHS ABS: 3059 {cells}/uL (ref 850–3900)
LYMPHS PCT: 23 %
MCH: 32.3 pg (ref 27.0–33.0)
MCHC: 32.7 g/dL (ref 32.0–36.0)
MCV: 98.8 fL (ref 80.0–100.0)
MONO ABS: 1197 {cells}/uL — AB (ref 200–950)
MPV: 10.6 fL (ref 7.5–12.5)
Monocytes Relative: 9 %
NEUTROS PCT: 63 %
Neutro Abs: 8379 cells/uL — ABNORMAL HIGH (ref 1500–7800)
Platelets: 406 10*3/uL — ABNORMAL HIGH (ref 140–400)
RBC: 4.06 MIL/uL (ref 3.80–5.10)
RDW: 15.2 % — ABNORMAL HIGH (ref 11.0–15.0)
WBC: 13.3 10*3/uL — AB (ref 3.8–10.8)

## 2016-05-12 LAB — URINALYSIS, ROUTINE W REFLEX MICROSCOPIC
Bilirubin Urine: NEGATIVE
GLUCOSE, UA: NEGATIVE
HGB URINE DIPSTICK: NEGATIVE
Ketones, ur: NEGATIVE
LEUKOCYTES UA: NEGATIVE
NITRITE: NEGATIVE
PROTEIN: NEGATIVE
Specific Gravity, Urine: 1.007 (ref 1.001–1.035)
pH: 6.5 (ref 5.0–8.0)

## 2016-05-12 LAB — IGG, IGA, IGM
IGA: 563 mg/dL — AB (ref 81–463)
IGM, SERUM: 169 mg/dL (ref 48–271)
IgG (Immunoglobin G), Serum: 1979 mg/dL — ABNORMAL HIGH (ref 694–1618)

## 2016-05-12 LAB — HEPATITIS A ANTIBODY, IGM: Hep A IgM: NONREACTIVE

## 2016-05-12 LAB — HEPATITIS C ANTIBODY: HCV AB: REACTIVE — AB

## 2016-05-12 LAB — HEPATITIS B SURFACE ANTIGEN: HEP B S AG: NEGATIVE

## 2016-05-12 LAB — HEPATITIS B CORE ANTIBODY, IGM: Hep B C IgM: NONREACTIVE

## 2016-05-12 LAB — SEDIMENTATION RATE

## 2016-05-12 LAB — HIV ANTIBODY (ROUTINE TESTING W REFLEX): HIV 1&2 Ab, 4th Generation: NONREACTIVE

## 2016-05-13 LAB — QUANTIFERON TB GOLD ASSAY (BLOOD)
INTERFERON GAMMA RELEASE ASSAY: POSITIVE — AB
Mitogen-Nil: 2.78 IU/mL
Quantiferon Nil Value: 0.02 IU/mL
Quantiferon Tb Ag Minus Nil Value: 6.71 IU/mL

## 2016-05-13 LAB — PROTEIN ELECTROPHORESIS, SERUM
ALPHA-1-GLOBULIN: 0.4 g/dL — AB (ref 0.2–0.3)
ALPHA-2-GLOBULIN: 0.9 g/dL (ref 0.5–0.9)
Albumin ELP: 3.8 g/dL (ref 3.8–4.8)
Beta 2: 0.7 g/dL — ABNORMAL HIGH (ref 0.2–0.5)
Beta Globulin: 0.6 g/dL (ref 0.4–0.6)
Gamma Globulin: 1.7 g/dL (ref 0.8–1.7)
Total Protein, Serum Electrophoresis: 8.1 g/dL (ref 6.1–8.1)

## 2016-05-14 ENCOUNTER — Telehealth: Payer: Self-pay | Admitting: *Deleted

## 2016-05-14 DIAGNOSIS — R7612 Nonspecific reaction to cell mediated immunity measurement of gamma interferon antigen response without active tuberculosis: Secondary | ICD-10-CM

## 2016-05-14 LAB — HEPATITIS C RNA QUANTITATIVE: HCV Quantitative: NOT DETECTED IU/mL (ref <15)

## 2016-05-14 NOTE — Telephone Encounter (Signed)
Patient's grandson advised of test results and verbalized understanding. Referral made to the [facility].

## 2016-05-14 NOTE — Progress Notes (Signed)
Please forward a copy of these labs to PCP. Patient will need treatment for positive tuberculosis test  at the health department. She should discontinue methotrexate for right now.

## 2016-05-14 NOTE — Telephone Encounter (Signed)
-----   Message from Bo Merino, MD sent at 05/14/2016 12:54 PM EST ----- Please forward a copy of these labs to PCP. Patient will need treatment for positive tuberculosis test  at the health department. She should discontinue methotrexate for right now.

## 2016-05-19 DIAGNOSIS — H5203 Hypermetropia, bilateral: Secondary | ICD-10-CM | POA: Diagnosis not present

## 2016-05-19 DIAGNOSIS — H52203 Unspecified astigmatism, bilateral: Secondary | ICD-10-CM | POA: Diagnosis not present

## 2016-05-19 DIAGNOSIS — H524 Presbyopia: Secondary | ICD-10-CM | POA: Diagnosis not present

## 2016-05-19 DIAGNOSIS — H2513 Age-related nuclear cataract, bilateral: Secondary | ICD-10-CM | POA: Diagnosis not present

## 2016-05-20 ENCOUNTER — Ambulatory Visit (HOSPITAL_COMMUNITY)
Admission: RE | Admit: 2016-05-20 | Discharge: 2016-05-20 | Disposition: A | Discharge status: Home / Self Care | Payer: Medicare Other | Source: Ambulatory Visit | Attending: Rheumatology | Admitting: Rheumatology

## 2016-05-20 DIAGNOSIS — M4802 Spinal stenosis, cervical region: Secondary | ICD-10-CM | POA: Diagnosis not present

## 2016-05-20 DIAGNOSIS — M546 Pain in thoracic spine: Secondary | ICD-10-CM

## 2016-05-20 DIAGNOSIS — S22000A Wedge compression fracture of unspecified thoracic vertebra, initial encounter for closed fracture: Secondary | ICD-10-CM | POA: Diagnosis not present

## 2016-05-22 ENCOUNTER — Telehealth: Payer: Self-pay | Admitting: Family Medicine

## 2016-05-22 DIAGNOSIS — S22060A Wedge compression fracture of T7-T8 vertebra, initial encounter for closed fracture: Secondary | ICD-10-CM

## 2016-05-22 NOTE — Telephone Encounter (Signed)
Received call from rheumatology PA regarding findings on MRI which they had ordered and neurosurgery referral suggested. Neurosurgery referral placed

## 2016-05-22 NOTE — Plan of Care (Cosign Needed)
  Patient was seen on initial visit by Dr. Estanislado Pandy are in mid December 2017.  An MRI of the thoracic spine was ordered and the results were obtained and reviewed today.  Please see MRI results for full details.  Based on the MRI findings, I spoke with Dr. Estanislado Pandy are (who is out of the office). She recommended that a consult with patient's PCP and informed them of the results and send him a copy as well as advised him that we feel that neurosurgery referral is appropriate.  I spoke with Dr. Jarold Song of [facility] and addressed the details described above with her. She will contact the patient and address patient's MRI results with her.  We also tried to contact the patient before we contacted the PCP. Both of the numbers that we have on file for the patient had no one answered the phone according to Ms. Amy Littrell. There was another number for another contact person and we left a message for the patient to call us back at our office.  Patient has a follow-up appointment in February 2018 with Dr. Estanislado Pandy.

## 2016-05-24 NOTE — Progress Notes (Signed)
Patient needs referral to neurosurgery. Please, forward the report to her PCP.

## 2016-05-25 HISTORY — PX: WRIST SURGERY: SHX841

## 2016-05-26 ENCOUNTER — Other Ambulatory Visit: Payer: Self-pay | Admitting: Family Medicine

## 2016-05-26 NOTE — Telephone Encounter (Signed)
I spoke with the nephew,Decario Mulugeta, at [phone].  He called our office this morning wanted follow-up regarding the call that we may to him on Friday, 05/22/2016 about 4:15 in the afternoon.  I explained to the nephew that I spoke with patient's PCP and updated them on the MRI finding.  I advised the nephew to call the PCP for follow-up on this issue. We had advised the PCP to consider making a referral to a neurosurgeon for evaluation and treatment of patient's thoracic pain and for the findings noted on the MRI.  Nephew is agreeable and has the PCPs number and will call them today.

## 2016-05-28 ENCOUNTER — Other Ambulatory Visit: Payer: Self-pay | Admitting: Family Medicine

## 2016-05-28 DIAGNOSIS — K121 Other forms of stomatitis: Secondary | ICD-10-CM

## 2016-05-28 DIAGNOSIS — M546 Pain in thoracic spine: Principal | ICD-10-CM

## 2016-05-28 DIAGNOSIS — G8929 Other chronic pain: Secondary | ICD-10-CM

## 2016-05-28 DIAGNOSIS — S22058A Other fracture of T5-T6 vertebra, initial encounter for closed fracture: Secondary | ICD-10-CM | POA: Diagnosis not present

## 2016-05-29 ENCOUNTER — Ambulatory Visit: Payer: Self-pay | Admitting: Rheumatology

## 2016-06-05 DIAGNOSIS — R05 Cough: Secondary | ICD-10-CM | POA: Diagnosis not present

## 2016-06-05 DIAGNOSIS — R7611 Nonspecific reaction to tuberculin skin test without active tuberculosis: Secondary | ICD-10-CM | POA: Diagnosis not present

## 2016-06-18 DIAGNOSIS — G950 Syringomyelia and syringobulbia: Secondary | ICD-10-CM | POA: Diagnosis not present

## 2016-06-18 DIAGNOSIS — S22000A Wedge compression fracture of unspecified thoracic vertebra, initial encounter for closed fracture: Secondary | ICD-10-CM | POA: Diagnosis not present

## 2016-06-24 ENCOUNTER — Other Ambulatory Visit: Payer: Self-pay | Admitting: Internal Medicine

## 2016-06-24 DIAGNOSIS — L304 Erythema intertrigo: Secondary | ICD-10-CM

## 2016-06-26 ENCOUNTER — Telehealth: Payer: Self-pay | Admitting: Radiology

## 2016-06-26 NOTE — Telephone Encounter (Signed)
I faxed information to health dept [phone]

## 2016-06-26 NOTE — Telephone Encounter (Signed)
Left message for Tammy at health department to make sure she has gotten the information on patients positive TB gold results. If not she will call me back

## 2016-06-29 ENCOUNTER — Telehealth: Payer: Self-pay | Admitting: Rheumatology

## 2016-06-29 ENCOUNTER — Ambulatory Visit: Payer: Medicare Other | Attending: Family Medicine | Admitting: Family Medicine

## 2016-06-29 ENCOUNTER — Encounter: Payer: Self-pay | Admitting: Family Medicine

## 2016-06-29 VITALS — BP 180/116 | HR 73 | Temp 98.3°F | Ht <= 58 in | Wt 134.6 lb

## 2016-06-29 DIAGNOSIS — J329 Chronic sinusitis, unspecified: Secondary | ICD-10-CM | POA: Diagnosis not present

## 2016-06-29 DIAGNOSIS — I10 Essential (primary) hypertension: Secondary | ICD-10-CM | POA: Diagnosis not present

## 2016-06-29 DIAGNOSIS — J328 Other chronic sinusitis: Secondary | ICD-10-CM

## 2016-06-29 DIAGNOSIS — E785 Hyperlipidemia, unspecified: Secondary | ICD-10-CM | POA: Insufficient documentation

## 2016-06-29 DIAGNOSIS — Z79899 Other long term (current) drug therapy: Secondary | ICD-10-CM | POA: Insufficient documentation

## 2016-06-29 DIAGNOSIS — E2839 Other primary ovarian failure: Secondary | ICD-10-CM | POA: Diagnosis not present

## 2016-06-29 DIAGNOSIS — M069 Rheumatoid arthritis, unspecified: Secondary | ICD-10-CM | POA: Insufficient documentation

## 2016-06-29 DIAGNOSIS — R7612 Nonspecific reaction to cell mediated immunity measurement of gamma interferon antigen response without active tuberculosis: Secondary | ICD-10-CM | POA: Insufficient documentation

## 2016-06-29 DIAGNOSIS — M8580 Other specified disorders of bone density and structure, unspecified site: Secondary | ICD-10-CM | POA: Diagnosis not present

## 2016-06-29 DIAGNOSIS — M0579 Rheumatoid arthritis with rheumatoid factor of multiple sites without organ or systems involvement: Secondary | ICD-10-CM

## 2016-06-29 MED ORDER — FLUTICASONE PROPIONATE 50 MCG/ACT NA SUSP: 2.0000 | Freq: Every day | NASAL | 6 refills | Status: DC

## 2016-06-29 MED ORDER — CLONIDINE HCL 0.1 MG PO TABS
0.1000 mg | ORAL_TABLET | Freq: Once | ORAL | Status: AC
  Administered 2016-06-29: 0.1 mg via ORAL

## 2016-06-29 NOTE — Telephone Encounter (Signed)
Patient's granddaughter called about TB test patient had done and has questions about resuming some of her medications that she was told to stop. Please advise.

## 2016-06-29 NOTE — Telephone Encounter (Signed)
Attempted to contact the patient and left message for patient to call the office.  

## 2016-06-29 NOTE — Progress Notes (Signed)
Subjective:  Patient ID: Kristy Oliver, female    DOB: August 16, 1945  Age: 71 y.o. MRN: [id number]  CC: Hypertension   HPI Kristy Oliver s a 71 year old female with a history of rheumatoid arthritis, hypertension, hyperlipidemia who comes in for a follow-up visit.  Her blood pressure is elevated and her granddaughter states she does have some whitecoat hypertension however they have not been keeping a blood pressure log at home. She endorses compliance with antihypertensives.  She complains of pain in the dorsum of both hands and associated swelling; she is not taking the tramadol which she was prescribed as her grandson was afraid of the side effects. She has a positive QuantiFERON gold test  and was referred to the [facility] by rheumatology however the patient informs me that she was told she would not need any treatment for latent TB.  She did have a series of imaging ordered by rheumatology which revealed juxta articular osteopenia, severe arthritis with erosive changes in joint space narrowing consistent with rheumatoid arthritis. MRI thoracic spine revealed acute T6 and acute to subacute T7 severe compression versus burst fractures. Mild canal stenosis at C6-7, 3 mm syrinx T3-4 through C5-6  Currently wearing a brace which she received from neurosurgery.  Past Medical History:  Diagnosis Date  . Arthritis   . Gout   . Hypertension   . Renal disorder     Past Surgical History:  Procedure Laterality Date  . BREAST LUMPECTOMY    . c-section x 2    . JOINT REPLACEMENT     left knee & left shoulder  . left breast cyst removal      Allergies  Allergen Reactions  . Iodinated Diagnostic Agents     unknown  . Motrin [Ibuprofen]     States she is only allergic to 800 mg formulation  . Milk-Related Compounds Diarrhea  . Shrimp [Shellfish Allergy] Hives, Swelling and Rash     Outpatient Medications Prior to Visit  Medication Sig Dispense Refill  . carvedilol  (COREG) 6.25 MG tablet Take 1 tablet (6.25 mg total) by mouth 2 (two) times daily. 60 tablet 5  . lidocaine (XYLOCAINE) 2 % solution USE AS DIRECTED 15MLS IN THE MOUTH OR THROAT AS NEEDED FOR MOUTH PAIN 100 mL 1  . cetirizine (ZYRTEC) 10 MG tablet Take 1 tablet (10 mg total) by mouth daily. 30 tablet 5  . clotrimazole (LOTRIMIN) 1 % cream APPLY TO AFFECTED AREA ON SKIN TWICE A DAY 30 g 1  . colchicine 0.6 MG tablet Take 1 tablet (0.6 mg total) by mouth daily. 30 tablet 3  . dronabinol (MARINOL) 2.5 MG capsule Take 1 capsule (2.5 mg total) by mouth 2 (two) times daily before lunch and supper. 60 capsule 3  . folic acid (FOLVITE) 1 MG tablet Take 1 mg by mouth daily. Reported on 11/15/2015    . hydroxychloroquine (PLAQUENIL) 200 MG tablet Take 400 mg by mouth daily. Reported on 11/15/2015    . lactulose (CHRONULAC) 10 GM/15ML solution Take 15 mLs (10 g total) by mouth 3 (three) times daily. 946 mL 2  . methotrexate (RHEUMATREX) 2.5 MG tablet Take 12.5 mg by mouth 2 (two) times a week. Methotrexate 2.5mg , 5 tablets on Wednesday and Thrusday    . pravastatin (PRAVACHOL) 20 MG tablet Take 1 tablet (20 mg total) by mouth daily. 30 tablet 5  . terbinafine (LAMISIL AT) 1 % cream Apply 1 application topically 2 (two) times daily. 30 g 2  . tiZANidine (ZANAFLEX)  4 MG tablet TAKE 1 TABLET BY MOUTH EVERY 8 HOURS AS NEEDED FOR MUSCLE SPASMS 90 tablet 2  . acyclovir (ZOVIRAX) 400 MG tablet Take 1 tablet (400 mg total) by mouth 5 (five) times daily. 35 tablet 0   No facility-administered medications prior to visit.     ROS Review of Systems Constitutional: Negative for activity change, appetite change and fatigue.  HENT: . Negative for congestion, sinus pressure and sore throat.   Eyes: Negative Respiratory: Negative for cough, chest tightness, shortness of breath and wheezing.   Cardiovascular: Negative for chest pain and palpitations.  Gastrointestinal:  Negative for abdominal distention and abdominal  pain.  Endocrine: Negative for polydipsia.  Genitourinary: Negative for dysuria and frequency.  Musculoskeletal: see hpi.  Skin: Negative for rash.  Neurological: Negative for tremors, light-headedness and numbness.  Hematological: Does not bruise/bleed easily.  Psychiatric/Behavioral: Negative for agitation and behavioral problems.   Objective:  BP (!) 180/116 (BP Location: Right Arm, Cuff Size: Small)   Pulse 73   Temp 98.3 F (36.8 C) (Oral)   Ht 4\' 8"  (1.422 m)   Wt 134 lb 9.6 oz (61.1 kg)   SpO2 93%   BMI 30.18 kg/m   BP/Weight 06/29/2016 05/11/2016 99991111  Systolic BP [id number]  Diastolic BP 99991111 76 70  Wt. (Lbs) 134.6 139 138.4  BMI 30.18 25.42 25.31      Physical Exam Constitutional: She is oriented to person, place, and time. She appears well-developed and well-nourished.  HEENT: ulcerations on buccal mucosa and angle of the mouth Cardiovascular: Normal rate, normal heart sounds and intact distal pulses.   No murmur heard. Pulmonary/Chest: Effort normal and breath sounds normal. She has no wheezes. She has no rales. She exhibits no tenderness.  Abdominal: Soft. Bowel sounds are normal. She exhibits no distension and no mass. There is no tenderness.  Musculoskeletal:  Multiple rheumatoid nodules in hands and elbows which are tender on the dorsum  Kyphosis of thoracic spine Thoracic brace in place Skin: Intertrigo rash with hyperpigmentation in inframammary region and erythematous scabs in between breasts Neurological: She is alert and oriented to person, place, and time.   CMP Latest Ref Rng & Units 05/11/2016 07/02/2015 06/01/2014  Glucose 65 - 99 mg/dL 81 85 83  BUN 7 - 25 mg/dL 12 18 12   Creatinine 0.50 - 0.99 mg/dL 0.87 0.92 0.73  Sodium 135 - 146 mmol/L 141 141 138  Potassium 3.5 - 5.3 mmol/L 4.6 5.0 5.0  Chloride 98 - 110 mmol/L 106 106 102  CO2 20 - 31 mmol/L 26 23 27   Calcium 8.6 - 10.4 mg/dL 9.8 9.3 10.3  Total Protein 6.1 - 8.1 g/dL 8.1 7.3 8.9(H)    Total Bilirubin 0.2 - 1.2 mg/dL 0.4 0.4 0.3  Alkaline Phos 33 - 130 U/L 256(H) 140(H) 220(H)  AST 10 - 35 U/L 27 23 40(H)  ALT 6 - 29 U/L 16 25 29     CBC    Component Value Date/Time   WBC 13.3 (H) 05/11/2016 0816   RBC 4.06 05/11/2016 0816   HGB 13.1 05/11/2016 0816   HCT 40.1 05/11/2016 0816   PLT 406 (H) 05/11/2016 0816   MCV 98.8 05/11/2016 0816   MCH 32.3 05/11/2016 0816   MCHC 32.7 05/11/2016 0816   RDW 15.2 (H) 05/11/2016 0816   LYMPHSABS 3,059 05/11/2016 0816   MONOABS 1,197 (H) 05/11/2016 0816   EOSABS 532 (H) 05/11/2016 0816   BASOSABS 133 05/11/2016 0816    Lipid Panel  Component Value Date/Time   CHOL 250 (H) 07/02/2015 0915   TRIG 102 07/02/2015 0915   HDL 96 07/02/2015 0915   CHOLHDL 2.6 07/02/2015 0915   VLDL 20 07/02/2015 0915   LDLCALC 134 (H) 07/02/2015 0915     Assessment & Plan:   1. Rheumatoid arthritis involving multiple sites with positive rheumatoid Methotrexate on hold due to positive QuantiFERON test Patient has pain at site of rheumatoid no diagnosis and has been advised to take tramadol which she was previously prescribed Keep appointment with rheumatology  - DG Bone Density; Future  2. Essential hypertension Controlled - cloNIDine (CATAPRES) tablet 0.1 mg; Take 1 tablet (0.1 mg total) by mouth once.  3. Other chronic sinusitis Uncontrolled on Zyrtec Flonase added to her regimen - fluticasone (FLONASE) 50 MCG/ACT nasal spray; Place 2 sprays into both nostrils daily.  Dispense: 16 g; Refill: 6  4. Osteopenia determined by x-ray Also due to recurrent use of prednisone; will need to exclude osteoporosis. - DG Bone Density; Future  5. Estrogen deficiency - DG Bone Density; Future   Meds ordered this encounter  Medications  . fluticasone (FLONASE) 50 MCG/ACT nasal spray    Sig: Place 2 sprays into both nostrils daily.    Dispense:  16 g    Refill:  6  . cloNIDine (CATAPRES) tablet 0.1 mg    Follow-up: Return in about 3  months (around 09/26/2016) for Follow-up of chronic medical conditions.   Arnoldo Morale MD

## 2016-06-29 NOTE — Progress Notes (Signed)
Didn't bring meds with her and doesn't know what she is taking

## 2016-07-01 NOTE — Telephone Encounter (Signed)
Attempted to contact the patient and left message for patient to call the office.  

## 2016-07-07 ENCOUNTER — Ambulatory Visit
Admission: RE | Admit: 2016-07-07 | Discharge: 2016-07-07 | Disposition: A | Discharge status: Home / Self Care | Payer: Medicare Other | Source: Ambulatory Visit | Attending: Family Medicine | Admitting: Family Medicine

## 2016-07-07 ENCOUNTER — Other Ambulatory Visit: Payer: Self-pay | Admitting: Family Medicine

## 2016-07-07 DIAGNOSIS — M810 Age-related osteoporosis without current pathological fracture: Secondary | ICD-10-CM | POA: Insufficient documentation

## 2016-07-07 DIAGNOSIS — M8580 Other specified disorders of bone density and structure, unspecified site: Secondary | ICD-10-CM

## 2016-07-07 DIAGNOSIS — M818 Other osteoporosis without current pathological fracture: Secondary | ICD-10-CM

## 2016-07-07 DIAGNOSIS — Z780 Asymptomatic menopausal state: Secondary | ICD-10-CM | POA: Diagnosis not present

## 2016-07-07 DIAGNOSIS — M0579 Rheumatoid arthritis with rheumatoid factor of multiple sites without organ or systems involvement: Secondary | ICD-10-CM

## 2016-07-07 DIAGNOSIS — E2839 Other primary ovarian failure: Secondary | ICD-10-CM

## 2016-07-07 MED ORDER — ALENDRONATE SODIUM 70 MG PO TABS: 70.0000 mg | ORAL_TABLET | ORAL | 6 refills | Status: DC

## 2016-07-09 NOTE — Progress Notes (Signed)
Office Visit Note  Patient: Kristy Oliver             Date of Birth: [birth year]           MRN: [id number]             PCP: Arnoldo Morale, MD Referring: Arnoldo Morale, MD Visit Date: 07/17/2016 Occupation: @GUAROCC @    Subjective: Joint pain and swelling   History of Present Illness: Kristy Oliver is a 71 y.o. female with history of some sero positive rheumatoid factor. She's been off methotrexate and Plaquenil since December. She started rifampin for positive TB gold. She got her first dose yesterday. According to her daughter her chest x-ray was negative. She reports that she continues to have active disease with some off-and-on flares. She complains of pain and discomfort in almost all of her joints. Her left hand especially painful.  She did see the neurosurgeon for her thoracic spine. The surgery was not recommended and she was given a thoracic brace and cervical spines support.  Activities of Daily Living:  Patient reports morning stiffness for 3 hours.   Patient Reports nocturnal pain.  Difficulty dressing/grooming: Reports Difficulty climbing stairs: Reports Difficulty getting out of chair: Reports Difficulty using hands for taps, buttons, cutlery, and/or writing: Reports   Review of Systems  Constitutional: Positive for fatigue and weakness. Negative for night sweats, weight gain and weight loss.  HENT: Negative for mouth sores, trouble swallowing, trouble swallowing, mouth dryness and nose dryness.   Eyes: Negative for pain, redness, visual disturbance and dryness.  Respiratory: Positive for shortness of breath. Negative for cough and difficulty breathing.   Cardiovascular: Negative for chest pain, palpitations, hypertension, irregular heartbeat and swelling in legs/feet.  Gastrointestinal: Negative for blood in stool, constipation and diarrhea.  Endocrine: Negative for increased urination.  Genitourinary: Negative for vaginal dryness.  Musculoskeletal: Positive  for arthralgias, joint pain, joint swelling, myalgias, morning stiffness and myalgias. Negative for muscle weakness and muscle tenderness.  Skin: Negative for color change, rash, hair loss, skin tightness, ulcers and sensitivity to sunlight.  Allergic/Immunologic: Negative for susceptible to infections.  Neurological: Negative for dizziness, memory loss and night sweats.  Hematological: Negative for swollen glands.  Psychiatric/Behavioral: Negative for depressed mood and sleep disturbance. The patient is not nervous/anxious.     PMFS History:  Patient Active Problem List   Diagnosis Date Noted  . History of asthma 07/16/2016  . History of depression 07/16/2016  . Compression fracture of body of thoracic vertebra ([place]) 07/16/2016  . Osteoporosis 07/07/2016  . 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 Moderate asthma without complication 08/81/1031  . High risk medication use 05/08/2016  . History of left shoulder replacement 05/08/2016  . Back pain 02/24/2016  . Stomatitis 09/23/2015  . Tinea pedis 09/23/2015  . Depression 09/23/2015  . Hyperlipidemia 07/04/2015  . Chronic sinusitis 06/27/2015  . HTN (hypertension) 10/12/2013  . Smoker 10/12/2013  . Bloating symptom 10/12/2013  . Gastroenteritis 07/02/2013  . Protein-calorie malnutrition, severe ([place]) 07/01/2013  . AKI (acute kidney injury) ([place]) 06/30/2013  . Dehydration 06/30/2013  . RA (rheumatoid arthritis) ([place]) 06/30/2013  . Enteritis due to Clostridium difficile 06/30/2013  . Tobacco use 06/30/2013  . Facial cellulitis 11/17/2012  . Poor dentition 11/17/2012  . HTN (hypertension), malignant 11/17/2012    Past Medical History:  Diagnosis Date  . Arthritis   . Gout   . Hypertension   . Renal disorder     History reviewed. No pertinent family history. Past Surgical  History:  Procedure Laterality Date  . BREAST LUMPECTOMY    . c-section x 2    . JOINT REPLACEMENT     left knee & left shoulder  . left  breast cyst removal     Social History   Social History Narrative  . No narrative on file     Objective: Vital Signs: BP (!) 163/99 (BP Location: Right Arm, Patient Position: Sitting, Cuff Size: Large)   Pulse 64   Resp 14   Wt 135 lb (61.2 kg)   BMI 30.27 kg/m    Physical Exam  Constitutional: She is oriented to person, place, and time. She appears well-developed and well-nourished.  HENT:  Head: Normocephalic and atraumatic.  Eyes: Conjunctivae and EOM are normal.  Neck: Normal range of motion.  Cardiovascular: Normal rate, regular rhythm, normal heart sounds and intact distal pulses.   Pulmonary/Chest: Effort normal and breath sounds normal.  Abdominal: Soft. Bowel sounds are normal.  Lymphadenopathy:    She has no cervical adenopathy.  Neurological: She is alert and oriented to person, place, and time.  Skin: Skin is warm and dry. Capillary refill takes less than 2 seconds.  Psychiatric: She has a normal mood and affect. Her behavior is normal.  Nursing note and vitals reviewed.    Musculoskeletal Exam:   CDAI Exam: No CDAI exam completed.    Investigation: No additional findings. Office Visit on 05/11/2016  Component Date Value Ref Range Status  . WBC 05/11/2016 13.3* 3.8 - 10.8 K/uL Final  . RBC 05/11/2016 4.06  3.80 - 5.10 MIL/uL Final  . Hemoglobin 05/11/2016 13.1  11.7 - 15.5 g/dL Final  . HCT 05/11/2016 40.1  35.0 - 45.0 % Final  . MCV 05/11/2016 98.8  80.0 - 100.0 fL Final  . MCH 05/11/2016 32.3  27.0 - 33.0 pg Final  . MCHC 05/11/2016 32.7  32.0 - 36.0 g/dL Final  . RDW 05/11/2016 15.2* 11.0 - 15.0 % Final  . Platelets 05/11/2016 406* 140 - 400 K/uL Final  . MPV 05/11/2016 10.6  7.5 - 12.5 fL Final  . Neutro Abs 05/11/2016 8379* 1,500 - 7,800 cells/uL Final  . Lymphs Abs 05/11/2016 3059  850 - 3,900 cells/uL Final  . Monocytes Absolute 05/11/2016 1197* 200 - 950 cells/uL Final  . Eosinophils Absolute 05/11/2016 532* 15 - 500 cells/uL Final  .  Basophils Absolute 05/11/2016 133  0 - 200 cells/uL Final  . Neutrophils Relative % 05/11/2016 63  % Final  . Lymphocytes Relative 05/11/2016 23  % Final  . Monocytes Relative 05/11/2016 9  % Final  . Eosinophils Relative 05/11/2016 4  % Final  . Basophils Relative 05/11/2016 1  % Final  . Smear Review 05/11/2016 Criteria for review not met   Final  . Sodium 05/11/2016 141  135 - 146 mmol/L Final  . Potassium 05/11/2016 4.6  3.5 - 5.3 mmol/L Final  . Chloride 05/11/2016 106  98 - 110 mmol/L Final  . CO2 05/11/2016 26  20 - 31 mmol/L Final  . Glucose, Bld 05/11/2016 81  65 - 99 mg/dL Final  . BUN 05/11/2016 12  7 - 25 mg/dL Final  . Creat 05/11/2016 0.87  0.50 - 0.99 mg/dL Final   Comment:   For patients > or = 70 years of age: The upper reference limit for Creatinine is approximately 13% higher for people identified as African-American.     . Total Bilirubin 05/11/2016 0.4  0.2 - 1.2 mg/dL Final  . Alkaline Phosphatase  05/11/2016 256* 33 - 130 U/L Final  . AST 05/11/2016 27  10 - 35 U/L Final  . ALT 05/11/2016 16  6 - 29 U/L Final  . Total Protein 05/11/2016 8.1  6.1 - 8.1 g/dL Final  . Albumin 05/11/2016 3.8  3.6 - 5.1 g/dL Final  . Calcium 05/11/2016 9.8  8.6 - 10.4 mg/dL Final  . GFR, Est African American 05/11/2016 80  >=60 mL/min Final  . GFR, Est Non African American 05/11/2016 69  >=60 mL/min Final  . Sed Rate 05/11/2016 >130* 0 - 30 mm/hr Final   Comment: THE ABOVE TEST WAS PERFORMED; HOWEVER THE QUANTITY WAS NOT SUFFICIENT FOR RESULT VERIFICATION.   Marland Kitchen Interferon Gamma Release Assay 05/11/2016 POSITIVE* NEGATIVE Final   Comment: In healthy persons who have a low likelihood both of M. tuberculosis infection and of progression to active tuberculosis if infected, a single positive QFT result should not be taken as reliable evidence of M. tuberculosis infection. Repeat testing, with either the initial test or a different test, may be considered on a case-by-case basis.   .  Quantiferon Nil Value 05/11/2016 0.02  IU/mL Final  . Mitogen-Nil 05/11/2016 2.78  IU/mL Final  . Quantiferon Tb Ag Minus Nil Value 05/11/2016 6.71  IU/mL Final   Comment:   The Nil tube value is used to determine if the patient has a preexisting immune response which could cause a false-positive reading on the test. In order for a test to be valid, the Nil tube must have a value of less than or equal to 8.0 IU/mL.   The mitogen control tube is used to assure the patient has a healthy immune status and also serves as a control for correct blood handling and incubation. It is used to detect false-negative readings. The mitogen tube must have a gamma interferon value of greater than or equal to 0.5 IU/mL higher than the value of the Nil tube.   The TB antigen tube is coated with the M. tuberculosis specific antigens. For a test to be considered positive, the TB antigen tube value minus the Nil tube value must be greater than or equal to 0.35 IU/mL.   For additional information, please refer to http://education.[facility].com/faq/QFT (This link is being provided for informational/educational purposes only.)   . HIV 1&2 Ab, 4th Generation 05/11/2016 NONREACTIVE  NONREACTIVE Final   Comment:   HIV-1 antigen and HIV-1/HIV-2 antibodies were not detected.  There is no laboratory evidence of HIV infection.   HIV-1/2 Antibody Diff        Not indicated. HIV-1 RNA, Qual TMA          Not indicated.     PLEASE NOTE: This information has been disclosed to you from records whose confidentiality may be protected by state law. If your state requires such protection, then the state law prohibits you from making any further disclosure of the information without the specific written consent of the person to whom it pertains, or as otherwise permitted by law. A general authorization for the release of medical or other information is NOT sufficient for this purpose.   The performance of this  assay has not been clinically validated in patients less than 69 years old.   For additional information please refer to http://education.[facility].com/faq/FAQ106.  (This link is being provided for informational/educational purposes only.)     . Total Protein, Serum Electrophores* 05/11/2016 8.1  6.1 - 8.1 g/dL Final  . Albumin ELP 05/11/2016 3.8  3.8 - 4.8 g/dL Final  .  Alpha-1-Globulin 05/11/2016 0.4* 0.2 - 0.3 g/dL Final  . Alpha-2-Globulin 05/11/2016 0.9  0.5 - 0.9 g/dL Final  . Beta Globulin 05/11/2016 0.6  0.4 - 0.6 g/dL Final  . Beta 2 05/11/2016 0.7* 0.2 - 0.5 g/dL Final  . Gamma Globulin 05/11/2016 1.7  0.8 - 1.7 g/dL Final  . Abnormal Protein Band1 05/11/2016 NOT DET  g/dL Final  . SPE Interp. 05/11/2016 SEE NOTE   Final   Comment: One or more serum protein fractions are outside the normal ranges. No abnormal protein bands are apparent. Reviewed by Odis Hollingshead, MD, PhD, FCAP (Electronic Signature on File)   . Abnormal Protein Band2 05/11/2016 NOT DET  g/dL Final  . Abnormal Protein Band3 05/11/2016 NOT DET  g/dL Final  . IgG (Immunoglobin G), Serum 05/11/2016 1979* 694 - 1,618 mg/dL Final  . IgA 05/11/2016 563* 81 - 463 mg/dL Final  . IgM, Serum 05/11/2016 169  48 - 271 mg/dL Final  . Color, Urine 05/11/2016 YELLOW  YELLOW Final   Comment: The preferred specimen for urinalysis testing is the [employer] urine collection tube. Effective 02/15/2016, [employer], a [employer], will reject any urine cup received that is not transferred into the preferred [employer] tube. Using this device, specimen components remain stable in transit up to 72 hours at ambient temperatures. Urine may be collected in a clean, unused cup and transferred to the yellow cap transfer tube. Supply order number is: [id number]. If you have any questions, please contact your [facility] Account Representative directly, or call our Customer Service  Department at [phone].   Marland Kitchen APPearance 05/11/2016 CLEAR  CLEAR Final  . Specific Gravity, Urine 05/11/2016 1.007  1.001 - 1.035 Final  . pH 05/11/2016 6.5  5.0 - 8.0 Final  . Glucose, UA 05/11/2016 NEGATIVE  NEGATIVE Final  . Bilirubin Urine 05/11/2016 NEGATIVE  NEGATIVE Final  . Ketones, ur 05/11/2016 NEGATIVE  NEGATIVE Final  . Hgb urine dipstick 05/11/2016 NEGATIVE  NEGATIVE Final  . Protein, ur 05/11/2016 NEGATIVE  NEGATIVE Final  . Nitrite 05/11/2016 NEGATIVE  NEGATIVE Final  . Leukocytes, UA 05/11/2016 NEGATIVE  NEGATIVE Final  . Hepatitis B Surface Ag 05/11/2016 NEGATIVE  NEGATIVE Final  . Hep B C IgM 05/11/2016 NON REACTIVE  NON REACTIVE Final   Comment: High levels of Hepatitis B Core IgM antibody are detectable during the acute stage of Hepatitis B. This antibody is used to differentiate current from past HBV infection.     . Hep A IgM 05/11/2016 NON REACTIVE  NON REACTIVE Final  . HCV Ab 05/11/2016 REACTIVE* NEGATIVE Final   Comment:                                                                        This test is for screening purposes only.  Reactive results should be confirmed by an alternative method.  Suggest HCV Qualitative, PCR, test code 83130.  Specimens will be stable for reflex testing up to 3 days after collection.   Marland Kitchen HCV Quantitative 05/11/2016 Not Detected  <15 IU/mL Final   Comment: No detectable level of HCV RNA.     Marland Kitchen HCV Quantitative Log 05/11/2016 NOT CALC  <1.18 log 10 Final   Comment:  This test utilizes the [place] FDA approved Roche HCV Test Kit by RT-PCR.     Imaging: Dg Bone Density  Result Date: 07/07/2016 EXAM: DUAL X-RAY ABSORPTIOMETRY (DXA) FOR BONE MINERAL DENSITY IMPRESSION: Referring Physician:  Arnoldo Morale PATIENT: Name: Asako, Saliba Patient ID: [id number] Birth Date: [birth year] Height: 58.0 in. Sex: Female Measured: 07/07/2016 Weight: 132.2 lbs. Indications: Estrogen Deficient, Height Loss (781.91), High risk medication  use, Low Calcium Intake (269.3), Postmenopausal, Rheumatoid Arthritis (714.0), Tobacco User (Current Smoker) Fractures: None Treatments: None ASSESSMENT: The BMD measured at AP Spine L1-L4 is 0.606 g/cm2 with a T-score of -4.8. This patient is considered OSTEOPOROTIC according to [facility]) criteria. Site Region Measured Date Measured Age YA T-score BMD Significant CHANGE AP Spine  L1-L4      9/07/[age]    68.1         -4.10    0.606 g/cm2 DualFemur Total Left 9/07/2016    68.1         -3.8    0.531 g/cm2 World Health Organization [facility]) criteria for post-menopausal, Caucasian Women: Normal       T-score at or above -1 SD Osteopenia   T-score between -1 and -2.5 SD Osteoporosis T-score at or below -2.5 SD RECOMMENDATION: [place] recommends that FDA-approved medical therapies be considered in postmenopausal women and men age 12 or older with a: 1. Hip or vertebral (clinical or morphometric) fracture. 2. T-score of <-2.5 at the spine or hip. 3. Ten-year fracture probability by FRAX of 3% or greater for hip fracture or 20% or greater for major osteoporotic fracture. All treatment decisions require clinical judgment and consideration of individual patient factors, including patient preferences, co-morbidities, previous drug use, risk factors not captured in the FRAX model (e.g. falls, vitamin D deficiency, increased bone turnover, interval significant decline in bone density) and possible under - or over-estimation of fracture risk by FRAX. All patients should ensure an adequate intake of dietary calcium (1200 mg/d) and vitamin D (800 IU daily) unless contraindicated. FOLLOW-UP: People with diagnosed cases of osteoporosis or at high risk for fracture should have regular bone mineral density tests. For patients eligible for Medicare, routine testing is allowed once every 2 years. The testing frequency can be increased to one year for patients who have rapidly progressing  disease, those who are receiving or discontinuing medical therapy to restore bone mass, or have additional risk factors. I have reviewed this report, and agree with the above findings. Mark A. Thornton Papas, M.D. [facility] Electronically Signed   By: Lavonia Dana M.D.   On: 07/07/2016 12:24    Speciality Comments: No specialty comments available.    Procedures:  No procedures performed Allergies: Clindamycin; Iodinated diagnostic agents; Motrin [ibuprofen]; Milk-related compounds; and Shrimp [shellfish allergy]   Assessment / Plan:     Visit Diagnoses: Rheumatoid arthritis involving multiple sites with positive rheumatoid factor ([place]): She has severe end-stage rheumatoid arthritis with multiple contractures and nodulosis. Her methotrexate was discontinued due to positive TB gold. She's having a flare with pain and swelling in multiple joints. She is a lot of discomfort. She's been on prednisone 10 mg by mouth daily for multiple years and would like to resume that she was also on Plaquenil 200 mg a day which we'll resume. My plan is to restart her methotrexate next visit.  I will give her a prescription for prednisone 10 mg by mouth daily today. After informed consent will be obtained she will be given Plaquenil 200 mg by  mouth daily. Eye exam form will be given to monitor Plaquenil toxicity.  She has no extension of her left fourth and fifth digit which is old. I'll refer her to hand surgeon for that.  Osteoporosis with multiple thoracic vertebral fractures. She was evaluated by neurosurgeon and was not a surgical candidate she is an brace currently  Other chronic sinusitis  High risk medication use: She is off methotrexate, Plaquenil, prednisone.  History of left shoulder replacement: She has limited range of motion.  Positive 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: She is on rifampin currently.  Tobacco use  Poor dentition  Gastroenteritis  History of asthma    Orders: Orders Placed This  Encounter  Procedures  . CBC with Differential/Platelet  . COMPLETE METABOLIC PANEL WITH GFR  . Ambulatory referral to Hand Surgery   Meds ordered this encounter  Medications  . predniSONE (DELTASONE) 10 MG tablet    Sig: Take 1 tablet (10 mg total) by mouth daily with breakfast.    Dispense:  30 tablet    Refill:  2  . hydroxychloroquine (PLAQUENIL) 200 MG tablet    Sig: Take 1 tablet (200 mg total) by mouth daily.    Dispense:  30 tablet    Refill:  2    Face-to-face time spent with patient was 40 minutes. 50% of time was spent in counseling and coordination of care.  Follow-Up Instructions: Return in about 3 months (around 10/14/2016) for Rheumatoid arthritis.   Bo Merino, MD  Note - This record has been created using Editor, commissioning.  Chart creation errors have been sought, but may not always  have been located. Such creation errors do not reflect on  the standard of medical care.d

## 2016-07-16 DIAGNOSIS — Z8709 Personal history of other diseases of the respiratory system: Secondary | ICD-10-CM | POA: Insufficient documentation

## 2016-07-16 DIAGNOSIS — S22000A Wedge compression fracture of unspecified thoracic vertebra, initial encounter for closed fracture: Secondary | ICD-10-CM | POA: Insufficient documentation

## 2016-07-16 DIAGNOSIS — Z8659 Personal history of other mental and behavioral disorders: Secondary | ICD-10-CM | POA: Insufficient documentation

## 2016-07-17 ENCOUNTER — Ambulatory Visit (INDEPENDENT_AMBULATORY_CARE_PROVIDER_SITE_OTHER): Payer: Medicare Other | Admitting: Rheumatology

## 2016-07-17 ENCOUNTER — Encounter: Payer: Self-pay | Admitting: Rheumatology

## 2016-07-17 VITALS — BP 163/99 | HR 64 | Resp 14 | Wt 135.0 lb

## 2016-07-17 DIAGNOSIS — Z8659 Personal history of other mental and behavioral disorders: Secondary | ICD-10-CM

## 2016-07-17 DIAGNOSIS — E782 Mixed hyperlipidemia: Secondary | ICD-10-CM

## 2016-07-17 DIAGNOSIS — F17200 Nicotine dependence, unspecified, uncomplicated: Secondary | ICD-10-CM

## 2016-07-17 DIAGNOSIS — Z8709 Personal history of other diseases of the respiratory system: Secondary | ICD-10-CM | POA: Diagnosis not present

## 2016-07-17 DIAGNOSIS — M0579 Rheumatoid arthritis with rheumatoid factor of multiple sites without organ or systems involvement: Secondary | ICD-10-CM

## 2016-07-17 DIAGNOSIS — Z96612 Presence of left artificial shoulder joint: Secondary | ICD-10-CM

## 2016-07-17 DIAGNOSIS — S22000A Wedge compression fracture of unspecified thoracic vertebra, initial encounter for closed fracture: Secondary | ICD-10-CM

## 2016-07-17 DIAGNOSIS — M4854XA Collapsed vertebra, not elsewhere classified, thoracic region, initial encounter for fracture: Secondary | ICD-10-CM

## 2016-07-17 DIAGNOSIS — R7612 Nonspecific reaction to cell mediated immunity measurement of gamma interferon antigen response without active tuberculosis: Secondary | ICD-10-CM

## 2016-07-17 DIAGNOSIS — Z79899 Other long term (current) drug therapy: Secondary | ICD-10-CM | POA: Diagnosis not present

## 2016-07-17 DIAGNOSIS — S66812A Strain of other specified muscles, fascia and tendons at wrist and hand level, left hand, initial encounter: Secondary | ICD-10-CM

## 2016-07-17 DIAGNOSIS — J328 Other chronic sinusitis: Secondary | ICD-10-CM

## 2016-07-17 DIAGNOSIS — Z720 Tobacco use: Secondary | ICD-10-CM | POA: Diagnosis not present

## 2016-07-17 DIAGNOSIS — K529 Noninfective gastroenteritis and colitis, unspecified: Secondary | ICD-10-CM

## 2016-07-17 DIAGNOSIS — K089 Disorder of teeth and supporting structures, unspecified: Secondary | ICD-10-CM

## 2016-07-17 DIAGNOSIS — J45909 Unspecified asthma, uncomplicated: Secondary | ICD-10-CM

## 2016-07-17 DIAGNOSIS — I10 Essential (primary) hypertension: Secondary | ICD-10-CM | POA: Diagnosis not present

## 2016-07-17 MED ORDER — PREDNISONE 10 MG PO TABS: 10.0000 mg | ORAL_TABLET | Freq: Every day | ORAL | 2 refills | Status: DC

## 2016-07-17 MED ORDER — HYDROXYCHLOROQUINE SULFATE 200 MG PO TABS: 200.0000 mg | ORAL_TABLET | Freq: Every day | ORAL | 2 refills | Status: DC

## 2016-07-17 NOTE — Progress Notes (Signed)
Pharmacy Note  Subjective: Patient presents today to the [facility] to see Dr. Estanislado Pandy.  Patient seen by the pharmacist for counseling on hydroxychloroquine.    Objective: CMP Latest Ref Rng & Units 05/11/2016 07/02/2015 06/01/2014  Glucose 65 - 99 mg/dL 81 85 83  BUN 7 - 25 mg/dL 12 18 12   Creatinine 0.50 - 0.99 mg/dL 0.87 0.92 0.73  Sodium 135 - 146 mmol/L 141 141 138  Potassium 3.5 - 5.3 mmol/L 4.6 5.0 5.0  Chloride 98 - 110 mmol/L 106 106 102  CO2 20 - 31 mmol/L 26 23 27   Calcium 8.6 - 10.4 mg/dL 9.8 9.3 10.3  Total Protein 6.1 - 8.1 g/dL 8.1 7.3 8.9(H)  Total Bilirubin 0.2 - 1.2 mg/dL 0.4 0.4 0.3  Alkaline Phos 33 - 130 U/L 256(H) 140(H) 220(H)  AST 10 - 35 U/L 27 23 40(H)  ALT 6 - 29 U/L 16 25 29     CBC    Component Value Date/Time   WBC 13.3 (H) 05/11/2016 0816   RBC 4.06 05/11/2016 0816   HGB 13.1 05/11/2016 0816   HCT 40.1 05/11/2016 0816   PLT 406 (H) 05/11/2016 0816   MCV 98.8 05/11/2016 0816   MCH 32.3 05/11/2016 0816   MCHC 32.7 05/11/2016 0816   RDW 15.2 (H) 05/11/2016 0816   LYMPHSABS 3,059 05/11/2016 0816   MONOABS 1,197 (H) 05/11/2016 0816   EOSABS 532 (H) 05/11/2016 0816   BASOSABS 133 05/11/2016 0816    Assessment/Plan: Patient was prescribed hydroxychloroquine 200 mg daily.  Patient was counseled on the purpose, proper use, and adverse effects of hydroxychloroquine including nausea/diarrhea, skin rash, headaches, and sun sensitivity.  Discussed importance of annual eye exams while on hydroxychloroquine to monitor to ocular toxicity and discussed importance of frequent laboratory monitoring.  Provided patient with eye exam form for baseline ophthalmologic exam and standing lab instructions.  Provided patient with educational materials on hydroxychloroquine and answered all questions.  Patient consented to hydroxychloroquine.  Will upload consent in the media tab.    Patient was also prescribed prednisone.  Reviewed the purpose, proper use,  and adverse effects of prednisone.  Advised patient to see her primary care provider for close follow up on blood pressure and blood glucose.  Patient voiced understanding.    Elisabeth Most, Pharm.D., BCPS Clinical Pharmacist Pager: [phone] Phone: [phone] 07/17/2016 12:54 PM

## 2016-07-17 NOTE — Patient Instructions (Addendum)
Standing Labs We placed an order today for your standing lab work.    Please come back and get your standing labs in 1 month then every 2 months.  We have open lab Monday through Friday from 8:30-11:30 AM and 1:30-4 PM at the office of Dr. Tresa Moore, PA.   The office is located at [address], [place], [place], [place] [address] No appointment is necessary.   Labs are drawn by [facility].  You may receive a bill from [facility] for your lab work.     Hydroxychloroquine tablets What is this medicine? HYDROXYCHLOROQUINE (hye drox ee KLOR oh kwin) is used to treat rheumatoid arthritis and systemic lupus erythematosus. It is also used to treat malaria. This medicine may be used for other purposes; ask your health care provider or pharmacist if you have questions. COMMON BRAND NAME(S): Plaquenil, Quineprox What should I tell my health care provider before I take this medicine? They need to know if you have any of these conditions: -diabetes -eye disease, vision problems -G6PD deficiency -history of blood diseases -history of irregular heartbeat -if you often drink alcohol -kidney disease -liver disease -porphyria -psoriasis -seizures -an unusual or allergic reaction to chloroquine, hydroxychloroquine, other medicines, foods, dyes, or preservatives -pregnant or trying to get pregnant -breast-feeding How should I use this medicine? Take this medicine by mouth with a glass of water. Follow the directions on the prescription label. Avoid taking antacids within 4 hours of taking this medicine. It is best to separate these medicines by at least 4 hours. Do not cut, crush or chew this medicine. You can take it with or without food. If it upsets your stomach, take it with food. Take your medicine at regular intervals. Do not take your medicine more often than directed. Take all of your medicine as directed even if you think you are better. Do not skip doses or stop your  medicine early. Talk to your pediatrician regarding the use of this medicine in children. While this drug may be prescribed for selected conditions, precautions do apply. Overdosage: If you think you have taken too much of this medicine contact a poison control center or emergency room at once. NOTE: This medicine is only for you. Do not share this medicine with others. What if I miss a dose? If you miss a dose, take it as soon as you can. If it is almost time for your next dose, take only that dose. Do not take double or extra doses. What may interact with this medicine? Do not take this medicine with any of the following medications: -cisapride -dofetilide -dronedarone -live virus vaccines -penicillamine -pimozide -thioridazine -ziprasidone This medicine may also interact with the following medications: -ampicillin -antacids -cimetidine -cyclosporine -digoxin -medicines for diabetes, like insulin, glipizide, glyburide -medicines for seizures like carbamazepine, phenobarbital, phenytoin -mefloquine -methotrexate -other medicines that prolong the QT interval (cause an abnormal heart rhythm) -praziquantel This list may not describe all possible interactions. Give your health care provider a list of all the medicines, herbs, non-prescription drugs, or dietary supplements you use. Also tell them if you smoke, drink alcohol, or use illegal drugs. Some items may interact with your medicine. What should I watch for while using this medicine? Tell your doctor or healthcare professional if your symptoms do not start to get better or if they get worse. Avoid taking antacids within 4 hours of taking this medicine. It is best to separate these medicines by at least 4 hours. Tell your doctor or health  care professional right away if you have any change in your eyesight. Your vision and blood may be tested before and during use of this medicine. This medicine can make you more sensitive to the  sun. Keep out of the sun. If you cannot avoid being in the sun, wear protective clothing and use sunscreen. Do not use sun lamps or tanning beds/booths. What side effects may I notice from receiving this medicine? Side effects that you should report to your doctor or health care professional as soon as possible: -allergic reactions like skin rash, itching or hives, swelling of the face, lips, or tongue -changes in vision -decreased hearing or ringing of the ears -redness, blistering, peeling or loosening of the skin, including inside the mouth -seizures -sensitivity to light -signs and symptoms of a dangerous change in heartbeat or heart rhythm like chest pain; dizziness; fast or irregular heartbeat; palpitations; feeling faint or lightheaded, falls; breathing problems -signs and symptoms of liver injury like dark yellow or brown urine; general ill feeling or flu-like symptoms; light-colored stools; loss of appetite; nausea; right upper belly pain; unusually weak or tired; yellowing of the eyes or skin -signs and symptoms of low blood sugar such as feeling anxious; confusion; dizziness; increased hunger; unusually weak or tired; sweating; shakiness; cold; irritable; headache; blurred vision; fast heartbeat; loss of consciousness -uncontrollable head, mouth, neck, arm, or leg movements Side effects that usually do not require medical attention (report to your doctor or health care professional if they continue or are bothersome): -anxious -diarrhea -dizziness -hair loss -headache -irritable -loss of appetite -nausea, vomiting -stomach pain This list may not describe all possible side effects. Call your doctor for medical advice about side effects. You may report side effects to FDA at 1-800-FDA-1088. Where should I keep my medicine? Keep out of the reach of children. In children, this medicine can cause overdose with small doses. Store at room temperature between 15 and 30 degrees C (59 and  86 degrees F). Protect from moisture and light. Throw away any unused medicine after the expiration date. NOTE: This sheet is a summary. It may not cover all possible information. If you have questions about this medicine, talk to your doctor, pharmacist, or health care provider.  2017 Elsevier/Gold Standard (2015-12-25 14:16:15)

## 2016-07-24 ENCOUNTER — Other Ambulatory Visit: Payer: Self-pay | Admitting: Family Medicine

## 2016-07-24 DIAGNOSIS — E7800 Pure hypercholesterolemia, unspecified: Secondary | ICD-10-CM

## 2016-08-06 ENCOUNTER — Telehealth: Payer: Self-pay | Admitting: Rheumatology

## 2016-08-06 NOTE — Telephone Encounter (Signed)
Beverly from Dr. Bertis Ruddy office called to let us know that they have the patient scheduled for March 29 @ 11:30.  CB#[id number].  Thank you.

## 2016-08-14 ENCOUNTER — Other Ambulatory Visit: Payer: Self-pay | Admitting: Family Medicine

## 2016-08-20 ENCOUNTER — Other Ambulatory Visit: Payer: Self-pay | Admitting: Family Medicine

## 2016-08-20 DIAGNOSIS — M05731 Rheumatoid arthritis with rheumatoid factor of right wrist without organ or systems involvement: Secondary | ICD-10-CM | POA: Diagnosis not present

## 2016-08-20 DIAGNOSIS — M669 Spontaneous rupture of unspecified tendon: Secondary | ICD-10-CM | POA: Diagnosis not present

## 2016-08-20 DIAGNOSIS — K5909 Other constipation: Secondary | ICD-10-CM

## 2016-08-20 DIAGNOSIS — M79641 Pain in right hand: Secondary | ICD-10-CM | POA: Diagnosis not present

## 2016-08-20 DIAGNOSIS — G8929 Other chronic pain: Secondary | ICD-10-CM

## 2016-08-20 DIAGNOSIS — M546 Pain in thoracic spine: Secondary | ICD-10-CM

## 2016-08-20 DIAGNOSIS — M05732 Rheumatoid arthritis with rheumatoid factor of left wrist without organ or systems involvement: Secondary | ICD-10-CM | POA: Diagnosis not present

## 2016-08-20 DIAGNOSIS — K121 Other forms of stomatitis: Secondary | ICD-10-CM

## 2016-08-20 DIAGNOSIS — M79642 Pain in left hand: Secondary | ICD-10-CM | POA: Diagnosis not present

## 2016-09-18 DIAGNOSIS — M659 Synovitis and tenosynovitis, unspecified: Secondary | ICD-10-CM | POA: Diagnosis not present

## 2016-09-18 DIAGNOSIS — G8918 Other acute postprocedural pain: Secondary | ICD-10-CM | POA: Diagnosis not present

## 2016-09-18 DIAGNOSIS — M79632 Pain in left forearm: Secondary | ICD-10-CM | POA: Diagnosis not present

## 2016-09-18 DIAGNOSIS — M65822 Other synovitis and tenosynovitis, left upper arm: Secondary | ICD-10-CM | POA: Diagnosis not present

## 2016-09-18 DIAGNOSIS — M66241 Spontaneous rupture of extensor tendons, right hand: Secondary | ICD-10-CM | POA: Diagnosis not present

## 2016-09-18 DIAGNOSIS — M65842 Other synovitis and tenosynovitis, left hand: Secondary | ICD-10-CM | POA: Diagnosis not present

## 2016-09-18 DIAGNOSIS — M6588 Other synovitis and tenosynovitis, other site: Secondary | ICD-10-CM | POA: Diagnosis not present

## 2016-09-18 DIAGNOSIS — M66242 Spontaneous rupture of extensor tendons, left hand: Secondary | ICD-10-CM | POA: Diagnosis not present

## 2016-09-18 DIAGNOSIS — M05632 Rheumatoid arthritis of left wrist with involvement of other organs and systems: Secondary | ICD-10-CM | POA: Diagnosis not present

## 2016-09-22 DIAGNOSIS — M25639 Stiffness of unspecified wrist, not elsewhere classified: Secondary | ICD-10-CM | POA: Diagnosis not present

## 2016-09-22 DIAGNOSIS — M79642 Pain in left hand: Secondary | ICD-10-CM | POA: Diagnosis not present

## 2016-09-22 DIAGNOSIS — M05731 Rheumatoid arthritis with rheumatoid factor of right wrist without organ or systems involvement: Secondary | ICD-10-CM | POA: Diagnosis not present

## 2016-09-22 DIAGNOSIS — Z4789 Encounter for other orthopedic aftercare: Secondary | ICD-10-CM | POA: Diagnosis not present

## 2016-09-22 DIAGNOSIS — M79641 Pain in right hand: Secondary | ICD-10-CM | POA: Diagnosis not present

## 2016-09-22 DIAGNOSIS — M05732 Rheumatoid arthritis with rheumatoid factor of left wrist without organ or systems involvement: Secondary | ICD-10-CM | POA: Diagnosis not present

## 2016-10-07 NOTE — Progress Notes (Deleted)
Office Visit Note  Patient: Kristy Oliver             Date of Birth: Apr 29, 1946           MRN: [id number]             PCP: Arnoldo Morale, MD Referring: Arnoldo Morale, MD Visit Date: 10/14/2016 Occupation: @GUAROCC @    Subjective:  No chief complaint on file.   History of Present Illness: [name] is a 71 y.o. female ***   Activities of Daily Living:  Patient reports morning stiffness for *** {minute/hour:19697}.   Patient {ACTIONS;DENIES/REPORTS:21021675::"Denies"} nocturnal pain.  Difficulty dressing/grooming: {ACTIONS;DENIES/REPORTS:21021675::"Denies"} Difficulty climbing stairs: {ACTIONS;DENIES/REPORTS:21021675::"Denies"} Difficulty getting out of chair: {ACTIONS;DENIES/REPORTS:21021675::"Denies"} Difficulty using hands for taps, buttons, cutlery, and/or writing: {ACTIONS;DENIES/REPORTS:21021675::"Denies"}   No Rheumatology ROS completed.   PMFS History:  Patient Active Problem List   Diagnosis Date Noted  . History of asthma 07/16/2016  . History of depression 07/16/2016  . Compression fracture of body of thoracic vertebra ([place]) 07/16/2016  . Osteoporosis 07/07/2016  . 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 Moderate asthma without complication 66/59/9357  . High risk medication use 05/08/2016  . History of left shoulder replacement 05/08/2016  . Back pain 02/24/2016  . Stomatitis 09/23/2015  . Tinea pedis 09/23/2015  . Depression 09/23/2015  . Hyperlipidemia 07/04/2015  . Chronic sinusitis 06/27/2015  . HTN (hypertension) 10/12/2013  . Smoker 10/12/2013  . Bloating symptom 10/12/2013  . Gastroenteritis 07/02/2013  . Protein-calorie malnutrition, severe ([facility]) 07/01/2013  . AKI (acute kidney injury) ([place]) 06/30/2013  . Dehydration 06/30/2013  . RA (rheumatoid arthritis) ([place]) 06/30/2013  . Enteritis due to Clostridium difficile 06/30/2013  . Tobacco use 06/30/2013  . Facial cellulitis 11/17/2012  . Poor dentition 11/17/2012  . HTN  (hypertension), malignant 11/17/2012    Past Medical History:  Diagnosis Date  . Arthritis   . Gout   . Hypertension   . Renal disorder     No family history on file. Past Surgical History:  Procedure Laterality Date  . BREAST LUMPECTOMY    . c-section x 2    . JOINT REPLACEMENT     left knee & left shoulder  . left breast cyst removal     Social History   Social History Narrative  . No narrative on file     Objective: Vital Signs: There were no vitals taken for this visit.   Physical Exam   Musculoskeletal Exam: ***  CDAI Exam: No CDAI exam completed.    Investigation: No additional findings.  CBC Latest Ref Rng & Units 05/11/2016 02/24/2016 07/01/2013  WBC 3.8 - 10.8 K/uL 13.3(H) 9.6 7.3  Hemoglobin 11.7 - 15.5 g/dL 13.1 12.9 12.9  Hematocrit 35.0 - 45.0 % 40.1 37.9 36.5  Platelets 140 - 400 K/uL 406(H) 322 180    CMP Latest Ref Rng & Units 05/11/2016 07/02/2015 06/01/2014  Glucose 65 - 99 mg/dL 81 85 83  BUN 7 - 25 mg/dL 12 18 12   Creatinine 0.50 - 0.99 mg/dL 0.87 0.92 0.73  Sodium 135 - 146 mmol/L 141 141 138  Potassium 3.5 - 5.3 mmol/L 4.6 5.0 5.0  Chloride 98 - 110 mmol/L 106 106 102  CO2 20 - 31 mmol/L 26 23 27   Calcium 8.6 - 10.4 mg/dL 9.8 9.3 10.3  Total Protein 6.1 - 8.1 g/dL 8.1 7.3 8.9(H)  Total Bilirubin 0.2 - 1.2 mg/dL 0.4 0.4 0.3  Alkaline Phos 33 - 130 U/L 256(H) 140(H) 220(H)  AST 10 - 35 U/L  27 23 40(H)  ALT 6 - 29 U/L 16 25 29    Imaging: No results found.  Speciality Comments: No specialty comments available.    Procedures:  No procedures performed Allergies: Clindamycin; Iodinated diagnostic agents; Motrin [ibuprofen]; Milk-related compounds; and Shrimp [shellfish allergy]   Assessment / Plan:     Visit Diagnoses: Rheumatoid arthritis involving multiple sites with positive rheumatoid factor (HCC)  High risk medication use  Positive 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  History of asthma  History of depression  History of left  shoulder replacement  Other osteoporosis without current pathological fracture  Poor dentition  Protein-calorie malnutrition, severe (HCC)  Smoker  History of hyperlipidemia  History of hypertension  History of chronic sinusitis    Orders: No orders of the defined types were placed in this encounter.  No orders of the defined types were placed in this encounter.   Face-to-face time spent with patient was *** minutes. 50% of time was spent in counseling and coordination of care.  Follow-Up Instructions: No Follow-up on file.   Tyshell Ramberg, RT  Note - This record has been created using [employer].  Chart creation errors have been sought, but may not always  have been located. Such creation errors do not reflect on  the standard of medical care.

## 2016-10-14 ENCOUNTER — Ambulatory Visit: Payer: Self-pay | Admitting: Rheumatology

## 2016-11-02 ENCOUNTER — Ambulatory Visit: Payer: Medicare Other | Attending: Family Medicine | Admitting: Family Medicine

## 2016-11-02 ENCOUNTER — Encounter: Payer: Self-pay | Admitting: Physician Assistant

## 2016-11-02 VITALS — BP 170/110 | HR 69 | Temp 98.3°F | Resp 16 | Wt 124.0 lb

## 2016-11-02 DIAGNOSIS — M0579 Rheumatoid arthritis with rheumatoid factor of multiple sites without organ or systems involvement: Secondary | ICD-10-CM | POA: Diagnosis not present

## 2016-11-02 DIAGNOSIS — Z131 Encounter for screening for diabetes mellitus: Secondary | ICD-10-CM | POA: Diagnosis not present

## 2016-11-02 DIAGNOSIS — I10 Essential (primary) hypertension: Secondary | ICD-10-CM | POA: Diagnosis not present

## 2016-11-02 DIAGNOSIS — Z79899 Other long term (current) drug therapy: Secondary | ICD-10-CM | POA: Diagnosis not present

## 2016-11-02 DIAGNOSIS — R0982 Postnasal drip: Secondary | ICD-10-CM

## 2016-11-02 DIAGNOSIS — M25579 Pain in unspecified ankle and joints of unspecified foot: Secondary | ICD-10-CM | POA: Diagnosis present

## 2016-11-02 DIAGNOSIS — M810 Age-related osteoporosis without current pathological fracture: Secondary | ICD-10-CM | POA: Insufficient documentation

## 2016-11-02 DIAGNOSIS — G8929 Other chronic pain: Secondary | ICD-10-CM

## 2016-11-02 DIAGNOSIS — R7611 Nonspecific reaction to tuberculin skin test without active tuberculosis: Secondary | ICD-10-CM | POA: Diagnosis not present

## 2016-11-02 DIAGNOSIS — M25569 Pain in unspecified knee: Secondary | ICD-10-CM | POA: Diagnosis present

## 2016-11-02 DIAGNOSIS — Z1231 Encounter for screening mammogram for malignant neoplasm of breast: Secondary | ICD-10-CM

## 2016-11-02 DIAGNOSIS — L84 Corns and callosities: Secondary | ICD-10-CM | POA: Diagnosis not present

## 2016-11-02 DIAGNOSIS — M545 Low back pain: Secondary | ICD-10-CM | POA: Diagnosis not present

## 2016-11-02 DIAGNOSIS — R634 Abnormal weight loss: Secondary | ICD-10-CM

## 2016-11-02 DIAGNOSIS — E7800 Pure hypercholesterolemia, unspecified: Secondary | ICD-10-CM | POA: Diagnosis not present

## 2016-11-02 DIAGNOSIS — M109 Gout, unspecified: Secondary | ICD-10-CM | POA: Diagnosis not present

## 2016-11-02 DIAGNOSIS — Z1211 Encounter for screening for malignant neoplasm of colon: Secondary | ICD-10-CM | POA: Diagnosis not present

## 2016-11-02 DIAGNOSIS — M818 Other osteoporosis without current pathological fracture: Secondary | ICD-10-CM

## 2016-11-02 DIAGNOSIS — E785 Hyperlipidemia, unspecified: Secondary | ICD-10-CM | POA: Insufficient documentation

## 2016-11-02 DIAGNOSIS — Z1239 Encounter for other screening for malignant neoplasm of breast: Secondary | ICD-10-CM

## 2016-11-02 DIAGNOSIS — M546 Pain in thoracic spine: Secondary | ICD-10-CM | POA: Diagnosis not present

## 2016-11-02 LAB — POCT GLYCOSYLATED HEMOGLOBIN (HGB A1C): HEMOGLOBIN A1C: 5.5

## 2016-11-02 MED ORDER — TRAMADOL HCL 50 MG PO TABS: 50.0000 mg | ORAL_TABLET | Freq: Two times a day (BID) | ORAL | 2 refills | Status: DC | PRN

## 2016-11-02 MED ORDER — ALENDRONATE SODIUM 70 MG PO TABS: 70.0000 mg | ORAL_TABLET | ORAL | 6 refills | Status: DC

## 2016-11-02 MED ORDER — CARVEDILOL 12.5 MG PO TABS: 12.5000 mg | ORAL_TABLET | Freq: Two times a day (BID) | ORAL | 5 refills | Status: DC

## 2016-11-02 MED ORDER — TIZANIDINE HCL 4 MG PO TABS: ORAL_TABLET | ORAL | 5 refills | Status: DC

## 2016-11-02 MED ORDER — FOLIC ACID 400 MCG PO TABS: 400.0000 ug | ORAL_TABLET | Freq: Every day | ORAL | 5 refills | Status: DC

## 2016-11-02 MED ORDER — PRAVASTATIN SODIUM 20 MG PO TABS: 20.0000 mg | ORAL_TABLET | Freq: Every day | ORAL | 5 refills | Status: DC

## 2016-11-02 MED ORDER — CETIRIZINE HCL 10 MG PO TABS: 10.0000 mg | ORAL_TABLET | Freq: Every day | ORAL | 5 refills | Status: DC

## 2016-11-02 NOTE — Progress Notes (Signed)
Subjective:  Patient ID: Kristy Oliver, female    DOB: 09-Aug-1945  Age: 71 y.o. MRN: [id number]  CC: Follow-up; Knee Pain; Ankle Pain; and Callouses (right foot)   HPI Kristy Oliver s a 71 year old female with a history of rheumatoid arthritis, hypertension, hyperlipidemia who comes in for a follow-up visit.  Her blood pressure is elevated and her granddaughter states she does have some whitecoat hypertension however they have not been keeping a blood pressure log at home. She endorses compliance with antihypertensives.  She is currently undergoing treatment for latent TB and remains on rifampin. Methotrexate has been placed on hold by rheumatology due to this and she is on hydroxychloroquine however review of her medication bottles indicates she does not have this with her.  Last visit to rheumatology was in 06/2016.  She did have a series of imaging ordered by rheumatology which revealed juxta articular osteopenia, severe arthritis with erosive changes in joint space narrowing consistent with rheumatoid arthritis. MRI thoracic spine revealed acute T6 and acute to subacute T7 severe compression versus burst fractures. Mild canal stenosis at C6-7, 3 mm syrinx T3-4 through C5-6  Wears a brace which she received from neurosurgery; surgery not recommended.  She complains of chronic low back pain, pain in all her joints and currently uses tizanidine as a muscle relaxant with no improvement in symptoms.  She has a right foot callus which is painful. Of note she has lost 25 pounds in the last 1 year which she attributes to anorexia as she does not feel like eating the food which her grandkids prepare for her. She has been unable to cook due to the rheumatoid nodules in her hand.  Past Medical History:  Diagnosis Date  . Arthritis   . Gout   . Hypertension   . Renal disorder     Past Surgical History:  Procedure Laterality Date  . BREAST LUMPECTOMY    . c-section x 2    . JOINT  REPLACEMENT     left knee & left shoulder  . left breast cyst removal      Allergies  Allergen Reactions  . Clindamycin Hives, Rash and Swelling  . Iodinated Diagnostic Agents     unknown  . Motrin [Ibuprofen] Other (See Comments)    blackout States she is only allergic to 800 mg formulation  . Milk-Related Compounds Diarrhea  . Shrimp [Shellfish Allergy] Hives, Swelling and Rash     Outpatient Medications Prior to Visit  Medication Sig Dispense Refill  . clotrimazole (LOTRIMIN) 1 % cream APPLY TO AFFECTED AREA ON SKIN TWICE A DAY 30 g 1  . colchicine 0.6 MG tablet Take 1 tablet (0.6 mg total) by mouth daily. 30 tablet 3  . fluticasone (FLONASE) 50 MCG/ACT nasal spray Place 2 sprays into both nostrils daily. 16 g 6  . lactulose (CHRONULAC) 10 GM/15ML solution TAKE 15 MLS BY MOUTH THREE TIMES A DAY 946 mL 1  . lidocaine (XYLOCAINE) 2 % solution USE AS DIRECTED 15MLS IN THE MOUTH OR THROAT AS NEEDED FOR MOUTH PAIN 100 mL 0  . rifampin (RIFADIN) 150 MG capsule Take 300 mg by mouth daily.    Marland Kitchen terbinafine (LAMISIL AT) 1 % cream Apply 1 application topically 2 (two) times daily. 30 g 2  . carvedilol (COREG) 6.25 MG tablet Take 1 tablet (6.25 mg total) by mouth 2 (two) times daily. 60 tablet 5  . cetirizine (ZYRTEC) 10 MG tablet Take 1 tablet (10 mg total) by mouth daily. [place]  tablet 5  . folic acid (FOLVITE) 846 MCG tablet TAKE 1 TABLET BY MOUTH EVERY DAY. 30 tablet 2  . pravastatin (PRAVACHOL) 20 MG tablet TAKE 1 TABLET BY MOUTH ONCE DAILY 30 tablet 3  . tiZANidine (ZANAFLEX) 4 MG tablet TAKE 1 TABLET BY MOUTH EVERY 8 HOURS AS NEEDED FOR MUSCLE SPASMS 90 tablet 0  . dronabinol (MARINOL) 2.5 MG capsule Take 1 capsule (2.5 mg total) by mouth 2 (two) times daily before lunch and supper. (Patient not taking: Reported on 11/02/2016) 60 capsule 3  . hydroxychloroquine (PLAQUENIL) 200 MG tablet Take 1 tablet (200 mg total) by mouth daily. (Patient not taking: Reported on 11/02/2016) 30 tablet 2  .  methotrexate (RHEUMATREX) 2.5 MG tablet Take 12.5 mg by mouth 2 (two) times a week. Methotrexate 2.5mg , 5 tablets on Wednesday and Thrusday    . predniSONE (DELTASONE) 10 MG tablet Take 1 tablet (10 mg total) by mouth daily with breakfast. 30 tablet 2  . alendronate (FOSAMAX) 70 MG tablet Take 1 tablet (70 mg total) by mouth once a week. Take with a full glass of water on an empty stomach. (Patient not taking: Reported on 07/17/2016) 4 tablet 6   No facility-administered medications prior to visit.     ROS Review of Systems Constitutional: Negative for activity change, appetite change and fatigue.  HENT:  Negative for congestion, sinus pressure and sore throat.   Eyes: Negative Respiratory: Negative for cough, chest tightness, shortness of breath and wheezing.   Cardiovascular: Negative for chest pain and palpitations.  Gastrointestinal:  Negative for abdominal distention and abdominal pain.  Endocrine: Negative for polydipsia.  Genitourinary: Negative for dysuria and frequency.  Musculoskeletal: see hpi.  Skin: Negative for rash.  Neurological: Negative for tremors, light-headedness and numbness.  Hematological: Does not bruise/bleed easily.  Psychiatric/Behavioral: Negative for agitation and behavioral problems.  Objective:  BP (!) 170/110 (BP Location: Right Arm, Patient Position: Sitting, Cuff Size: Small)   Pulse 69   Temp 98.3 F (36.8 C) (Oral)   Resp 16   Wt 124 lb (56.2 kg)   SpO2 95%   BMI 27.80 kg/m   BP/Weight 11/02/2016 6/59/9357 0/05/7791  Systolic BP 903 009 233  Diastolic BP 007 99 622  Wt. (Lbs) 124 135 134.6  BMI 27.8 30.27 30.18      Physical Exam Constitutional: She is oriented to person, place, and time. She chronically ill  HEENT: Poor oral hygiene Cardiovascular: Normal rate, normal heart sounds and intact distal pulses.   No murmur heard. Pulmonary/Chest: Effort normal and breath sounds normal. She has no wheezes. She has no rales. She exhibits no  tenderness.  Abdominal: Soft. Bowel sounds are normal. She exhibits no distension and no mass. There is no tenderness.  Musculoskeletal:  Multiple rheumatoid nodules in hands and elbows which are tender on the dorsum  Kyphosis of thoracic spine Left forearm in a brace Skin: callus on lateral aspect of right sole Neurological: She is alert and oriented to person, place, and time.  Assessment & Plan:   1. Other osteoporosis without current pathological fracture She has not been taking Fosamax which I have refilled - alendronate (FOSAMAX) 70 MG tablet; Take 1 tablet (70 mg total) by mouth once a week. Take with a full glass of water on an empty stomach.  Dispense: 4 tablet; Refill: 6 - CBC with Differential  2. Essential hypertension Uncontrolled Increased dose of carvedilol - carvedilol (COREG) 12.5 MG tablet; Take 1 tablet (12.5 mg total) by mouth  2 (two) times daily.  Dispense: 60 tablet; Refill: 5 - Basic Metabolic Panel  3. Post-nasal drip - cetirizine (ZYRTEC) 10 MG tablet; Take 1 tablet (10 mg total) by mouth daily.  Dispense: 30 tablet; Refill: 5  4. Chronic midline thoracic back pain Uncontrolled Tylenol 3 added to regimen - tiZANidine (ZANAFLEX) 4 MG tablet; TAKE 1 TABLET BY MOUTH EVERY 8 HOURS AS NEEDED FOR MUSCLE SPASMS  Dispense: 90 tablet; Refill: 5  5. Pure hypercholesterolemia - pravastatin (PRAVACHOL) 20 MG tablet; Take 1 tablet (20 mg total) by mouth daily.  Dispense: 30 tablet; Refill: 5  6. Screening for breast cancer - MM Digital Screening; Future  7. Screening for colon cancer - Ambulatory referral to Gastroenterology  8. Weight loss 25 lb loss in the last 1 year We'll screen for malignancy given weight loss This could also be secondary to poor oral intake - Ambulatory referral to Gastroenterology  9. Screening for diabetes mellitus She is high risk for diabetes mellitus due to chronic steroid use A1c is normal at  10. Rheumatoid arthritis involving  multiple sites with positive rheumatoid factor (HCC) Currently on hydroxychloroquine Methotrexate on hold due to patient being on treatment for latent TB - CBC with Differential  11. Corn - Ambulatory referral to Podiatry   Meds ordered this encounter  Medications  . traMADol (ULTRAM) 50 MG tablet    Sig: Take 1 tablet (50 mg total) by mouth every 12 (twelve) hours as needed.    Dispense:  60 tablet    Refill:  2  . alendronate (FOSAMAX) 70 MG tablet    Sig: Take 1 tablet (70 mg total) by mouth once a week. Take with a full glass of water on an empty stomach.    Dispense:  4 tablet    Refill:  6  . carvedilol (COREG) 12.5 MG tablet    Sig: Take 1 tablet (12.5 mg total) by mouth 2 (two) times daily.    Dispense:  60 tablet    Refill:  5    Discontinue previous dose  . cetirizine (ZYRTEC) 10 MG tablet    Sig: Take 1 tablet (10 mg total) by mouth daily.    Dispense:  30 tablet    Refill:  5  . tiZANidine (ZANAFLEX) 4 MG tablet    Sig: TAKE 1 TABLET BY MOUTH EVERY 8 HOURS AS NEEDED FOR MUSCLE SPASMS    Dispense:  90 tablet    Refill:  5  . pravastatin (PRAVACHOL) 20 MG tablet    Sig: Take 1 tablet (20 mg total) by mouth daily.    Dispense:  30 tablet    Refill:  5  . folic acid (FOLVITE) 366 MCG tablet    Sig: Take 1 tablet (400 mcg total) by mouth daily.    Dispense:  30 tablet    Refill:  5    Follow-up: Return in about 3 months (around 02/02/2017) for follow up of chronic medical conditions.   Arnoldo Morale MD

## 2016-11-02 NOTE — Patient Instructions (Signed)

## 2016-11-03 ENCOUNTER — Encounter: Payer: Self-pay | Admitting: Family Medicine

## 2016-11-03 LAB — BASIC METABOLIC PANEL
BUN/Creatinine Ratio: 9 — ABNORMAL LOW (ref 12–28)
BUN: 6 mg/dL — ABNORMAL LOW (ref 8–27)
CO2: 20 mmol/L (ref 20–29)
Calcium: 9.4 mg/dL (ref 8.7–10.3)
Chloride: 107 mmol/L — ABNORMAL HIGH (ref 96–106)
Creatinine, Ser: 0.69 mg/dL (ref 0.57–1.00)
GFR calc Af Amer: 103 mL/min/{1.73_m2} (ref >59)
GFR calc non Af Amer: 90 mL/min/{1.73_m2} (ref >59)
GLUCOSE: 97 mg/dL (ref 65–99)
POTASSIUM: 3.9 mmol/L (ref 3.5–5.2)
SODIUM: 142 mmol/L (ref 134–144)

## 2016-11-03 LAB — CBC WITH DIFFERENTIAL/PLATELET
BASOS: 1 %
Basophils Absolute: 0 10*3/uL (ref 0.0–0.2)
EOS (ABSOLUTE): 0.4 10*3/uL (ref 0.0–0.4)
Eos: 7 %
Hematocrit: 40.6 % (ref 34.0–46.6)
Hemoglobin: 14.6 g/dL (ref 11.1–15.9)
IMMATURE GRANULOCYTES: 0 %
Immature Grans (Abs): 0 10*3/uL (ref 0.0–0.1)
Lymphocytes Absolute: 1.6 10*3/uL (ref 0.7–3.1)
Lymphs: 27 %
MCH: 30.6 pg (ref 26.6–33.0)
MCHC: 36 g/dL — AB (ref 31.5–35.7)
MCV: 85 fL (ref 79–97)
MONOS ABS: 0.4 10*3/uL (ref 0.1–0.9)
Monocytes: 7 %
NEUTROS PCT: 58 %
Neutrophils Absolute: 3.5 10*3/uL (ref 1.4–7.0)
PLATELETS: 231 10*3/uL (ref 150–379)
RBC: 4.77 x10E6/uL (ref 3.77–5.28)
RDW: 14.4 % (ref 12.3–15.4)
WBC: 5.8 10*3/uL (ref 3.4–10.8)

## 2016-11-10 ENCOUNTER — Ambulatory Visit: Payer: Self-pay | Admitting: Physician Assistant

## 2016-11-10 ENCOUNTER — Encounter: Payer: Self-pay | Admitting: Nurse Practitioner

## 2016-11-10 ENCOUNTER — Ambulatory Visit (INDEPENDENT_AMBULATORY_CARE_PROVIDER_SITE_OTHER): Payer: Medicare Other | Admitting: Nurse Practitioner

## 2016-11-10 VITALS — BP 108/70 | HR 73 | Ht 59.0 in | Wt 124.0 lb

## 2016-11-10 DIAGNOSIS — Z1211 Encounter for screening for malignant neoplasm of colon: Secondary | ICD-10-CM | POA: Diagnosis not present

## 2016-11-10 DIAGNOSIS — R634 Abnormal weight loss: Secondary | ICD-10-CM | POA: Diagnosis not present

## 2016-11-10 NOTE — Patient Instructions (Signed)
If you are age 69 or older, your body mass index should be between 23-30. Your Body mass index is 25.04 kg/m. If this is out of the aforementioned range listed, please consider follow up with your Primary Care Provider.  If you are age 54 or younger, your body mass index should be between 19-25. Your Body mass index is 25.04 kg/m. If this is out of the aformentioned range listed, please consider follow up with your Primary Care Provider.   You have been scheduled for an endoscopy. Please follow written instructions given to you at your visit today. If you use inhalers (even only as needed), please bring them with you on the day of your procedure. Your physician has requested that you go to www.[email or web address].com and enter the access code given to you at your visit today. This web site gives a general overview about your procedure. However, you should still follow specific instructions given to you by our office regarding your preparation for the procedure.   You have been scheduled for a colonoscopy. Please follow written instructions given to you at your visit today.  Please pick up your prep supplies at the pharmacy within the next 1-3 days. If you use inhalers (even only as needed), please bring them with you on the day of your procedure. Your physician has requested that you go to www.[email or web address].com and enter the access code given to you at your visit today. This web site gives a general overview about your procedure. However, you should still follow specific instructions given to you by our office regarding your preparation for the procedure.   Thank you for choosing me and [facility].   Tye Savoy, NP

## 2016-11-10 NOTE — Progress Notes (Signed)
HPI: Patient is a 71 year old female with severe RA, hx of multiple fractures. Plaquenil on home medication list but patient has not taken it in several months She is referred by PCP Dr. Arnoldo Morale for weight loss. Initially weight loss was intentional. Approximately 5 years ago patient was apparently over 200 pounds. She changed her diet and began losing weight. Over the last year she has unintentionally lost down from 149 to 124 pounds. Her appetite is poor, feels full quickly. No abdominal pain or nausea. She has chronic intermittent constipation. No blood in her stools. Labs from 11/02/16 including a BMET and CBC with differential were unremarkable. No recent abdominal imaging. She is a smoker but down to only 4 cigarettes a day. No significant cough. No fevers. She complained of sweating last night (unusual for her) but no night sweats. She had a positive Quantiferon TB gold and will be completing Rifampin this month.   Past Medical History:  Diagnosis Date  . Arthritis   . Gout   . Hypertension   . Renal disorder     Past Surgical History:  Procedure Laterality Date  . BREAST LUMPECTOMY    . c-section x 2    . JOINT REPLACEMENT     left knee & left shoulder  . left breast cyst removal     History reviewed. No pertinent family history. Social History  Substance Use Topics  . Smoking status: Current Every Day Smoker    Packs/day: 0.25    Types: Cigarettes  . Smokeless tobacco: Never Used     Comment: 4 cigs daily  . Alcohol use 1.2 oz/week    1 Glasses of wine, 1 Cans of beer per week     Comment: In january   Current Outpatient Prescriptions  Medication Sig Dispense Refill  . alendronate (FOSAMAX) 70 MG tablet Take 1 tablet (70 mg total) by mouth once a week. Take with a full glass of water on an empty stomach. 4 tablet 6  . carvedilol (COREG) 12.5 MG tablet Take 1 tablet (12.5 mg total) by mouth 2 (two) times daily. 60 tablet 5  . cetirizine (ZYRTEC) 10 MG tablet  Take 1 tablet (10 mg total) by mouth daily. 30 tablet 5  . clotrimazole (LOTRIMIN) 1 % cream APPLY TO AFFECTED AREA ON SKIN TWICE A DAY 30 g 1  . colchicine 0.6 MG tablet Take 1 tablet (0.6 mg total) by mouth daily. 30 tablet 3  . fluticasone (FLONASE) 50 MCG/ACT nasal spray Place 2 sprays into both nostrils daily. 16 g 6  . folic acid (FOLVITE) 892 MCG tablet Take 1 tablet (400 mcg total) by mouth daily. 30 tablet 5  . lactulose (CHRONULAC) 10 GM/15ML solution TAKE 15 MLS BY MOUTH THREE TIMES A DAY 946 mL 1  . lidocaine (XYLOCAINE) 2 % solution USE AS DIRECTED 15MLS IN THE MOUTH OR THROAT AS NEEDED FOR MOUTH PAIN 100 mL 0  . pravastatin (PRAVACHOL) 20 MG tablet Take 1 tablet (20 mg total) by mouth daily. 30 tablet 5  . rifampin (RIFADIN) 150 MG capsule Take 300 mg by mouth daily.    Marland Kitchen terbinafine (LAMISIL AT) 1 % cream Apply 1 application topically 2 (two) times daily. 30 g 2  . tiZANidine (ZANAFLEX) 4 MG tablet TAKE 1 TABLET BY MOUTH EVERY 8 HOURS AS NEEDED FOR MUSCLE SPASMS 90 tablet 5  . traMADol (ULTRAM) 50 MG tablet Take 1 tablet (50 mg total) by mouth every 12 (twelve) hours as  needed. 60 tablet 2  . dronabinol (MARINOL) 2.5 MG capsule Take 1 capsule (2.5 mg total) by mouth 2 (two) times daily before lunch and supper. (Patient not taking: Reported on 11/10/2016) 60 capsule 3   No current facility-administered medications for this visit.    Allergies  Allergen Reactions  . Clindamycin Hives, Rash and Swelling  . Iodinated Diagnostic Agents     unknown  . Motrin [Ibuprofen] Other (See Comments)    blackout States she is only allergic to 800 mg formulation  . Milk-Related Compounds Diarrhea  . Shrimp [Shellfish Allergy] Hives, Swelling and Rash     Review of Systems: Positive for allergy, sinus trouble, arthritis, back pain, vision changes, night sweats and sleeping problems. All other systems reviewed and negative except where noted in HPI.    Physical Exam: BP 108/70   Pulse  73   Ht 4' 11"  (1.499 m)   Wt 124 lb (56.2 kg)   BMI 25.04 kg/m  Constitutional:  Well-developed, black female in no acute distress. Psychiatric: Normal mood and affect. Behavior is normal. EENT: Pupils normal.  Conjunctivae are normal. No scleral icterus. Neck supple.  Cardiovascular: Normal rate, regular rhythm. No edema Pulmonary/chest: Effort normal and breath sounds normal. No wheezing, rales or rhonchi. Abdominal: Soft, nondistended. Nontender. Bowel sounds active throughout. There are no masses palpable. No hepatomegaly. Excessive amount of loose skin. RLQ with mild fullness.  Lymphadenopathy: No cervical adenopathy noted. Neurological: Alert and oriented to person place and time. Skin: Skin is warm and dry. No rashes noted.   ASSESSMENT AND PLAN:  71 yo female with weight loss. Started out as intentional weight loss but weight has continued to decline unintentionally over the last year ( ~ 25 pounds). Appetite is poor and she has early satiety but no abdominal pain, nausea or bowel changes. Marinol listed on home med list but patient hasn't started it yet.  -for further evaluation patient will be scheduled for EGD. The risks and benefits of EGD were discussed and the patient agrees to proceed. She has never had a colonoscopy so will schedule her for that as well.  The risks and benefits of the procedure were discussed and the patient agrees to proceed. If endoscopic workup negative will consider CT scan to rule out underlying malignancy.   Colon cancer screening.  -see #1  Elevated alk phos, chronic (years). She has RA / hx of multiple fractures. Remaining liver labs normal.  Followed by Rheumatology. Plaquenil on home med list but hasn't taken it since February.   Positive quantiferon gold test. She will complete Rifampin end of June.   Tobacco use. She currently smokes 4 cigarettes / day  Tye Savoy, NP  11/10/2016, 4:44 PM  Cc:  Arnoldo Morale, MD

## 2016-11-13 NOTE — Progress Notes (Signed)
Reviewed and agree with documentation and assessment and plan. K. Veena Nandigam , MD   

## 2016-12-01 ENCOUNTER — Encounter: Payer: Self-pay | Admitting: Internal Medicine

## 2016-12-07 ENCOUNTER — Encounter: Payer: Self-pay | Admitting: Internal Medicine

## 2016-12-07 ENCOUNTER — Ambulatory Visit (AMBULATORY_SURGERY_CENTER): Payer: Medicare Other | Admitting: Internal Medicine

## 2016-12-07 VITALS — BP 150/87 | HR 52 | Temp 96.6°F | Resp 16 | Ht 59.0 in | Wt 124.0 lb

## 2016-12-07 DIAGNOSIS — K2950 Unspecified chronic gastritis without bleeding: Secondary | ICD-10-CM | POA: Diagnosis not present

## 2016-12-07 DIAGNOSIS — K2970 Gastritis, unspecified, without bleeding: Secondary | ICD-10-CM

## 2016-12-07 DIAGNOSIS — K2990 Gastroduodenitis, unspecified, without bleeding: Secondary | ICD-10-CM

## 2016-12-07 DIAGNOSIS — R634 Abnormal weight loss: Secondary | ICD-10-CM | POA: Diagnosis not present

## 2016-12-07 DIAGNOSIS — B9681 Helicobacter pylori [H. pylori] as the cause of diseases classified elsewhere: Secondary | ICD-10-CM | POA: Diagnosis not present

## 2016-12-07 MED ORDER — SODIUM CHLORIDE 0.9 % IV SOLN: 500.0000 mL | INTRAVENOUS | Status: AC

## 2016-12-07 NOTE — Progress Notes (Signed)
Called to room to assist during endoscopic procedure.  Patient ID and intended procedure confirmed with present staff. Received instructions for my participation in the procedure from the performing physician.No egg or soy allergy per pt.

## 2016-12-07 NOTE — Patient Instructions (Addendum)
There was inflammation in the stomach and intestine - I took biopsies and will let you know.  I appreciate the opportunity to care for you. Gatha Mayer, MD, FACG YOU HAD AN ENDOSCOPIC PROCEDURE TODAY AT [facility]:   Refer to the procedure report that was given to you for any specific questions about what was found during the examination.  If the procedure report does not answer your questions, please call your gastroenterologist to clarify.  If you requested that your care partner not be given the details of your procedure findings, then the procedure report has been included in a sealed envelope for you to review at your convenience later.  YOU SHOULD EXPECT: Some feelings of bloating in the abdomen. Passage of more gas than usual.  Walking can help get rid of the air that was put into your GI tract during the procedure and reduce the bloating. If you had a lower endoscopy (such as a colonoscopy or flexible sigmoidoscopy) you may notice spotting of blood in your stool or on the toilet paper. If you underwent a bowel prep for your procedure, you may not have a normal bowel movement for a few days.  Please Note:  You might notice some irritation and congestion in your nose or some drainage.  This is from the oxygen used during your procedure.  There is no need for concern and it should clear up in a day or so.  SYMPTOMS TO REPORT IMMEDIATELY:   Following upper endoscopy (EGD)  Vomiting of blood or coffee ground material  New chest pain or pain under the shoulder blades  Painful or persistently difficult swallowing  New shortness of breath  Fever of 100F or higher  Black, tarry-looking stools  For urgent or emergent issues, a gastroenterologist can be reached at any hour by calling [phone].   DIET:  We do recommend a small meal at first, but then you may proceed to your regular diet.  Drink plenty of fluids but you should avoid alcoholic beverages for  24 hours.  MEDICATIONS: Continue present medications.  Keep colonoscopy appointment already scheduled for 12/24/2016.  ACTIVITY:  You should plan to take it easy for the rest of today and you should NOT DRIVE or use heavy machinery until tomorrow (because of the sedation medicines used during the test).    FOLLOW UP: Our staff will call the number listed on your records the next business day following your procedure to check on you and address any questions or concerns that you may have regarding the information given to you following your procedure. If we do not reach you, we will leave a message.  However, if you are feeling well and you are not experiencing any problems, there is no need to return our call.  We will assume that you have returned to your regular daily activities without incident.  If any biopsies were taken you will be contacted by phone or by letter within the next 1-3 weeks.  Please call us at [phone] if you have not heard about the biopsies in 3 weeks.   Thank you for allowing [place] to provide for your healthcare needs today.  SIGNATURES/CONFIDENTIALITY: You and/or your care partner have signed paperwork which will be entered into your electronic medical record.  These signatures attest to the fact that that the information above on your After Visit Summary has been reviewed and is understood.  Full responsibility of the confidentiality of this discharge information  lies with you and/or your care-partner.

## 2016-12-07 NOTE — Op Note (Signed)
[place] Patient Name: Kristy Oliver Procedure Date: 12/07/2016 10:36 AM MRN: [id number] Endoscopist: Gatha Mayer , MD Age: 71 Referring MD:  Date of Birth: Feb 17, 1946 Gender: Female Account #: [id number] Procedure:                Upper GI endoscopy Indications:              Weight loss Medicines:                Propofol per Anesthesia, Monitored Anesthesia Care Procedure:                Pre-Anesthesia Assessment:                           - Prior to the procedure, a History and Physical                            was performed, and patient medications and                            allergies were reviewed. The patient's tolerance of                            previous anesthesia was also reviewed. The risks                            and benefits of the procedure and the sedation                            options and risks were discussed with the patient.                            All questions were answered, and informed consent                            was obtained. Prior Anticoagulants: The patient has                            taken no previous anticoagulant or antiplatelet                            agents. ASA Grade Assessment: III - A patient with                            severe systemic disease. After reviewing the risks                            and benefits, the patient was deemed in                            satisfactory condition to undergo the procedure.                           After obtaining informed consent, the endoscope was  passed under direct vision. Throughout the                            procedure, the patient's blood pressure, pulse, and                            oxygen saturations were monitored continuously. The                            Endoscope was introduced through the mouth, and                            advanced to the second part of duodenum. The upper                            GI endoscopy  was accomplished without difficulty.                            The patient tolerated the procedure well. Scope In: Scope Out: Findings:                 Multiple dispersed, diminutive non-bleeding                            erosions were found in the gastric antrum. There                            were no stigmata of recent bleeding. Biopsies were                            taken with a cold forceps for histology.                            Verification of patient identification for the                            specimen was done. Estimated blood loss was minimal.                           A few diffuse erosions without bleeding were found                            in the duodenal bulb.                           The exam was otherwise without abnormality.                           The cardia and gastric fundus were normal on                            retroflexion. Complications:            No immediate complications. Estimated Blood Loss:     Estimated blood loss was minimal. Impression:               -  Non-bleeding erosive gastropathy. Biopsied.                           - Duodenal erosions without bleeding. Recommendation:           - Patient has a contact number available for                            emergencies. The signs and symptoms of potential                            delayed complications were discussed with the                            patient. Return to normal activities tomorrow.                            Written discharge instructions were provided to the                            patient.                           - Continue present medications.                           - Await pathology results.                           - May need CT abd/pelvis - has colonoscopy appt 8/2                           - Resume previous diet. Gatha Mayer, MD 12/07/2016 10:53:08 AM This report has been signed electronically.

## 2016-12-07 NOTE — Progress Notes (Signed)
Kristen Moore PA-S present for procedure 

## 2016-12-07 NOTE — Progress Notes (Signed)
A and O x3. Report to RN. Tolerated MAC anesthesia well.Teeth unchanged after procedure.

## 2016-12-08 ENCOUNTER — Telehealth: Payer: Self-pay | Admitting: *Deleted

## 2016-12-08 NOTE — Telephone Encounter (Signed)
No answer, left message to call if questions or concerns. 

## 2016-12-08 NOTE — Telephone Encounter (Signed)
  Follow up Call-  Call back number 12/07/2016  Post procedure Call Back phone  # [phone] pt   rose granddaughter- [phone]  Permission to leave phone message Yes  Some recent data might be hidden     Patient questions:  I tried both phones. One just rang and rang, and the other one is not accepting calls at this time

## 2016-12-13 ENCOUNTER — Encounter: Payer: Self-pay | Admitting: Internal Medicine

## 2016-12-13 DIAGNOSIS — B9681 Helicobacter pylori [H. pylori] as the cause of diseases classified elsewhere: Secondary | ICD-10-CM

## 2016-12-13 DIAGNOSIS — K2970 Gastritis, unspecified, without bleeding: Secondary | ICD-10-CM

## 2016-12-13 HISTORY — DX: Helicobacter pylori (H. pylori) as the cause of diseases classified elsewhere: B96.81

## 2016-12-13 NOTE — Progress Notes (Signed)
Patient has H pylori gastrritis  Tx needed 1) Omeprazole 20 mg 2 times a day x 14 d 2) Pepto Bismol 2 tabs (262 mg each) 4 times a day x 14 d 3) Metronidazole 250 mg 4 times a day x 14 d 4) doxycycline 100 mg 2 times a day x 14 d  After 14 d stop omeprazole also  In 4 weeks after treatment completed do H. Pylori stool antigen - dx H. Pylori gastritis  She needs REV me in 5-6 weeks also - if sxs not much better will consider Remeron  NO [place] letter or recall

## 2016-12-14 ENCOUNTER — Other Ambulatory Visit: Payer: Self-pay

## 2016-12-14 DIAGNOSIS — A048 Other specified bacterial intestinal infections: Secondary | ICD-10-CM

## 2016-12-14 MED ORDER — BISMUTH SUBSALICYLATE 262 MG PO CHEW: 524.0000 mg | CHEWABLE_TABLET | Freq: Four times a day (QID) | ORAL | 0 refills | Status: AC

## 2016-12-14 MED ORDER — DOXYCYCLINE HYCLATE 100 MG PO CAPS: 100.0000 mg | ORAL_CAPSULE | Freq: Two times a day (BID) | ORAL | 0 refills | Status: AC

## 2016-12-14 MED ORDER — OMEPRAZOLE 20 MG PO CPDR: 20.0000 mg | DELAYED_RELEASE_CAPSULE | Freq: Two times a day (BID) | ORAL | 0 refills | Status: DC

## 2016-12-14 MED ORDER — METRONIDAZOLE 250 MG PO TABS: 250.0000 mg | ORAL_TABLET | Freq: Four times a day (QID) | ORAL | 0 refills | Status: AC

## 2016-12-15 ENCOUNTER — Encounter: Payer: Self-pay | Admitting: Podiatry

## 2016-12-15 ENCOUNTER — Ambulatory Visit (INDEPENDENT_AMBULATORY_CARE_PROVIDER_SITE_OTHER): Payer: Medicare Other | Admitting: Podiatry

## 2016-12-15 DIAGNOSIS — L84 Corns and callosities: Secondary | ICD-10-CM

## 2016-12-15 DIAGNOSIS — M79672 Pain in left foot: Secondary | ICD-10-CM | POA: Diagnosis not present

## 2016-12-15 DIAGNOSIS — M79671 Pain in right foot: Secondary | ICD-10-CM | POA: Diagnosis not present

## 2016-12-15 NOTE — Progress Notes (Signed)
   Subjective:    Patient ID: Kristy Oliver, female    DOB: 1946/03/20, 72 y.o.   MRN: [id number]  HPI  71 year old female presents the office with concerns of a hard spot on the bottom of her foot on both sides of the right side worse than left. This in ongoing for several months. She denies a drainage or pus any redness or swelling to her feet. She's had no recent treatment for this. She has no other concerns today.   Review of Systems  All other systems reviewed and are negative.      Objective:   Physical Exam General: AAO x3, NAD  Dermatological: Hyperkeratotic lesions present bilateral submetatarsal 5 with a right side worse than left. Upon debridement there is no underlying ulceration, drainage or any signs of infection. Is no other open lesions or pre-ulcerative lesions identified bilaterally. Dry skin is present. There is no interdigital maceration present.  Vascular: DP/PT pulses 2/4, CRT < 3 sec.  There is no pain with calf compression, swelling, warmth, erythema.   Neruologic: Grossly intact via light touch bilateral. Vibratory intact via tuning fork bilateral. Protective threshold with Semmes Wienstein monofilament intact to all pedal sites bilateral.   Musculoskeletal: No gross boney pedal deformities bilateral. No pain, crepitus, or limitation noted with foot and ankle range of motion bilateral. Muscular strength 5/5 in all groups tested bilateral.    Assessment & Plan:   71 year old female with symptomatic hyperkeratotic lesions bilaterally  -Treatment options discussed including all alternatives, risks, and complications -Etiology of symptoms were discussed -Sharply debrided hyperkeratotic lesions 2 without any complications or bleeding.  -Daily foot inspection  -Moisturizer to the feet daily. Do not apply interdigital.  -RTC 3 months if needed or sooner if needed. Call with any questions or concerns.   Celesta Gentile, DPM

## 2016-12-22 DIAGNOSIS — M79641 Pain in right hand: Secondary | ICD-10-CM | POA: Diagnosis not present

## 2016-12-22 DIAGNOSIS — M05732 Rheumatoid arthritis with rheumatoid factor of left wrist without organ or systems involvement: Secondary | ICD-10-CM | POA: Diagnosis not present

## 2016-12-22 DIAGNOSIS — M05731 Rheumatoid arthritis with rheumatoid factor of right wrist without organ or systems involvement: Secondary | ICD-10-CM | POA: Diagnosis not present

## 2016-12-22 DIAGNOSIS — Z9889 Other specified postprocedural states: Secondary | ICD-10-CM | POA: Diagnosis not present

## 2016-12-22 DIAGNOSIS — M79642 Pain in left hand: Secondary | ICD-10-CM | POA: Diagnosis not present

## 2016-12-23 ENCOUNTER — Other Ambulatory Visit: Payer: Self-pay | Admitting: Family Medicine

## 2016-12-23 DIAGNOSIS — Z1231 Encounter for screening mammogram for malignant neoplasm of breast: Secondary | ICD-10-CM

## 2016-12-24 ENCOUNTER — Ambulatory Visit (AMBULATORY_SURGERY_CENTER): Payer: Medicare Other | Admitting: Internal Medicine

## 2016-12-24 ENCOUNTER — Encounter: Payer: Self-pay | Admitting: Internal Medicine

## 2016-12-24 VITALS — BP 183/99 | HR 58 | Temp 96.9°F | Resp 11 | Ht 59.0 in | Wt 124.0 lb

## 2016-12-24 DIAGNOSIS — Z1212 Encounter for screening for malignant neoplasm of rectum: Secondary | ICD-10-CM

## 2016-12-24 DIAGNOSIS — D124 Benign neoplasm of descending colon: Secondary | ICD-10-CM | POA: Diagnosis not present

## 2016-12-24 DIAGNOSIS — Z1211 Encounter for screening for malignant neoplasm of colon: Secondary | ICD-10-CM | POA: Diagnosis not present

## 2016-12-24 DIAGNOSIS — K219 Gastro-esophageal reflux disease without esophagitis: Secondary | ICD-10-CM | POA: Diagnosis not present

## 2016-12-24 DIAGNOSIS — I10 Essential (primary) hypertension: Secondary | ICD-10-CM | POA: Diagnosis not present

## 2016-12-24 MED ORDER — SODIUM CHLORIDE 0.9 % IV SOLN: 500.0000 mL | INTRAVENOUS | Status: AC

## 2016-12-24 NOTE — Progress Notes (Signed)
Report to PACU, RN, vss, BBS= Clear.  

## 2016-12-24 NOTE — Op Note (Signed)
[place] Patient Name: Kristy Oliver Procedure Date: 12/24/2016 8:32 AM MRN: [id number] Endoscopist: Gatha Mayer , MD Age: 71 Referring MD:  Date of Birth: 1946/03/04 Gender: Female Account #: [id number] Procedure:                Colonoscopy Indications:              Screening for colorectal malignant neoplasm Medicines:                Propofol per Anesthesia, Monitored Anesthesia Care Procedure:                Pre-Anesthesia Assessment:                           - Prior to the procedure, a History and Physical                            was performed, and patient medications and                            allergies were reviewed. The patient's tolerance of                            previous anesthesia was also reviewed. The risks                            and benefits of the procedure and the sedation                            options and risks were discussed with the patient.                            All questions were answered, and informed consent                            was obtained. Prior Anticoagulants: The patient has                            taken no previous anticoagulant or antiplatelet                            agents. ASA Grade Assessment: II - A patient with                            mild systemic disease. After reviewing the risks                            and benefits, the patient was deemed in                            satisfactory condition to undergo the procedure.                           After obtaining informed consent, the colonoscope  was passed under direct vision. Throughout the                            procedure, the patient's blood pressure, pulse, and                            oxygen saturations were monitored continuously. The                            Colonoscope was introduced through the anus and                            advanced to the the cecum, identified by   appendiceal orifice and ileocecal valve. The                            colonoscopy was performed without difficulty. The                            patient tolerated the procedure well. The quality                            of the bowel preparation was excellent. The                            ileocecal valve, appendiceal orifice, and rectum                            were photographed. The bowel preparation used was                            Miralax. Scope In: 8:43:49 AM Scope Out: 2:13:08 AM Scope Withdrawal Time: 0 hours 10 minutes 42 seconds  Total Procedure Duration: 0 hours 15 minutes 29 seconds  Findings:                 The perianal examination was normal.                           The digital rectal exam findings include decreased                            sphincter tone. Pertinent negatives include no                            palpable rectal lesions.                           Two sessile polyps were found in the descending                            colon. The polyps were 4 to 6 mm in size. These                            polyps were removed with a cold snare. Resection  and retrieval were complete. Verification of                            patient identification for the specimen was done.                            Estimated blood loss was minimal.                           Diverticula were found in the sigmoid colon.                           The exam was otherwise without abnormality on                            direct and retroflexion views. Complications:            No immediate complications. Estimated Blood Loss:     Estimated blood loss was minimal. Impression:               - Decreased sphincter tone found on digital rectal                            exam.                           - Two 4 to 6 mm polyps in the descending colon,                            removed with a cold snare. Resected and retrieved.                           -  Diverticulosis in the sigmoid colon.                           - The examination was otherwise normal on direct                            and retroflexion views. Recommendation:           - Patient has a contact number available for                            emergencies. The signs and symptoms of potential                            delayed complications were discussed with the                            patient. Return to normal activities tomorrow.                            Written discharge instructions were provided to the                            patient.                           -  Resume previous diet.                           - Continue present medications.                           - Repeat colonoscopy is recommended. The                            colonoscopy date will be determined after pathology                            results from today's exam become available for                            review. Gatha Mayer, MD 12/24/2016 9:02:49 AM This report has been signed electronically.

## 2016-12-24 NOTE — Progress Notes (Signed)
Called to room to assist during endoscopic procedure.  Patient ID and intended procedure confirmed with present staff. Received instructions for my participation in the procedure from the performing physician.  

## 2016-12-24 NOTE — Patient Instructions (Addendum)
I found and removed 2 small polyps. You also have a condition called diverticulosis - common and not usually a problem. Please read the handout provided.  I will let you know pathology results and when to have another routine colonoscopy by mail and/or My Chart.  I appreciate the opportunity to care for you. Gatha Mayer, MD, [facility]   Discharge instructions given. Handouts on polyps and diverticulosis. Resume previous medications. YOU HAD AN ENDOSCOPIC PROCEDURE TODAY AT [facility]:   Refer to the procedure report that was given to you for any specific questions about what was found during the examination.  If the procedure report does not answer your questions, please call your gastroenterologist to clarify.  If you requested that your care partner not be given the details of your procedure findings, then the procedure report has been included in a sealed envelope for you to review at your convenience later.  YOU SHOULD EXPECT: Some feelings of bloating in the abdomen. Passage of more gas than usual.  Walking can help get rid of the air that was put into your GI tract during the procedure and reduce the bloating. If you had a lower endoscopy (such as a colonoscopy or flexible sigmoidoscopy) you may notice spotting of blood in your stool or on the toilet paper. If you underwent a bowel prep for your procedure, you may not have a normal bowel movement for a few days.  Please Note:  You might notice some irritation and congestion in your nose or some drainage.  This is from the oxygen used during your procedure.  There is no need for concern and it should clear up in a day or so.  SYMPTOMS TO REPORT IMMEDIATELY:   Following lower endoscopy (colonoscopy or flexible sigmoidoscopy):  Excessive amounts of blood in the stool  Significant tenderness or worsening of abdominal pains  Swelling of the abdomen that is new, acute  Fever of 100F or higher   For urgent or  emergent issues, a gastroenterologist can be reached at any hour by calling [phone].   DIET:  We do recommend a small meal at first, but then you may proceed to your regular diet.  Drink plenty of fluids but you should avoid alcoholic beverages for 24 hours.  ACTIVITY:  You should plan to take it easy for the rest of today and you should NOT DRIVE or use heavy machinery until tomorrow (because of the sedation medicines used during the test).    FOLLOW UP: Our staff will call the number listed on your records the next business day following your procedure to check on you and address any questions or concerns that you may have regarding the information given to you following your procedure. If we do not reach you, we will leave a message.  However, if you are feeling well and you are not experiencing any problems, there is no need to return our call.  We will assume that you have returned to your regular daily activities without incident.  If any biopsies were taken you will be contacted by phone or by letter within the next 1-3 weeks.  Please call us at [phone] if you have not heard about the biopsies in 3 weeks.    SIGNATURES/CONFIDENTIALITY: You and/or your care partner have signed paperwork which will be entered into your electronic medical record.  These signatures attest to the fact that that the information above on your After Visit Summary has been reviewed and  is understood.  Full responsibility of the confidentiality of this discharge information lies with you and/or your care-partner.

## 2016-12-24 NOTE — Progress Notes (Signed)
Pt has some memory deficits, but pt denies it.  I asked pt's granddaughter, Victory Dakin and she confirmed pt did have some "mild memory issues".  I asked pt if it was ok to have Victory Dakin to help with her questions, pt states, "yes, she knows all about me".  Pt did know that she was having a colonoscopy today and had no questions about her procedure.  Maw  Margie Ege, RN started pt's IV and noticed pt had a small hematoma with a small tear in her skin on right forearm under a/c to the rightl.  No drainage from area.  Pt said she her skin itched there and she noticed it 3 day ago and she has been scratching that area. MAW  Pt's granddaughter reported pt "came in contact with TB but did not have TB and was treated for 4 months with medication".  She reported pt had finished her meds about 1 month ago.  maw

## 2016-12-25 ENCOUNTER — Telehealth: Payer: Self-pay

## 2016-12-25 NOTE — Telephone Encounter (Signed)
Left message on answering machine. 

## 2016-12-25 NOTE — Telephone Encounter (Signed)
  Follow up Call-  Call back number 12/24/2016 12/07/2016  Post procedure Call Back phone  # [phone] cell [phone] pt   rose granddaughter- [phone]  Permission to leave phone message Yes Yes  Some recent data might be hidden     Patient questions:  Do you have a fever, pain , or abdominal swelling? No. Pain Score  0 *  Have you tolerated food without any problems? Yes.    Have you been able to return to your normal activities? Yes.    Do you have any questions about your discharge instructions: Diet   No. Medications  No. Follow up visit  No.  Do you have questions or concerns about your Care? No.  Actions: * If pain score is 4 or above: No action needed, pain <4.

## 2016-12-29 ENCOUNTER — Encounter: Payer: Self-pay | Admitting: Internal Medicine

## 2016-12-29 DIAGNOSIS — Z86010 Personal history of colonic polyps: Secondary | ICD-10-CM

## 2016-12-29 DIAGNOSIS — Z860101 Personal history of adenomatous and serrated colon polyps: Secondary | ICD-10-CM

## 2016-12-29 HISTORY — DX: Personal history of colonic polyps: Z86.010

## 2016-12-29 HISTORY — DX: Personal history of adenomatous and serrated colon polyps: Z86.0101

## 2017-01-19 DIAGNOSIS — Z9889 Other specified postprocedural states: Secondary | ICD-10-CM | POA: Diagnosis not present

## 2017-01-19 DIAGNOSIS — M79641 Pain in right hand: Secondary | ICD-10-CM | POA: Diagnosis not present

## 2017-01-19 DIAGNOSIS — M05732 Rheumatoid arthritis with rheumatoid factor of left wrist without organ or systems involvement: Secondary | ICD-10-CM | POA: Diagnosis not present

## 2017-01-19 DIAGNOSIS — M05731 Rheumatoid arthritis with rheumatoid factor of right wrist without organ or systems involvement: Secondary | ICD-10-CM | POA: Diagnosis not present

## 2017-02-08 ENCOUNTER — Ambulatory Visit: Payer: Self-pay | Admitting: Internal Medicine

## 2017-02-22 ENCOUNTER — Other Ambulatory Visit: Payer: Self-pay

## 2017-02-22 DIAGNOSIS — M66231 Spontaneous rupture of extensor tendons, right forearm: Secondary | ICD-10-CM | POA: Diagnosis not present

## 2017-02-22 DIAGNOSIS — G8918 Other acute postprocedural pain: Secondary | ICD-10-CM | POA: Diagnosis not present

## 2017-02-22 DIAGNOSIS — M65841 Other synovitis and tenosynovitis, right hand: Secondary | ICD-10-CM | POA: Diagnosis not present

## 2017-02-22 DIAGNOSIS — M0579 Rheumatoid arthritis with rheumatoid factor of multiple sites without organ or systems involvement: Secondary | ICD-10-CM | POA: Diagnosis not present

## 2017-02-25 DIAGNOSIS — M05731 Rheumatoid arthritis with rheumatoid factor of right wrist without organ or systems involvement: Secondary | ICD-10-CM | POA: Diagnosis not present

## 2017-02-25 DIAGNOSIS — M05732 Rheumatoid arthritis with rheumatoid factor of left wrist without organ or systems involvement: Secondary | ICD-10-CM | POA: Diagnosis not present

## 2017-02-25 DIAGNOSIS — M25531 Pain in right wrist: Secondary | ICD-10-CM | POA: Diagnosis not present

## 2017-03-16 ENCOUNTER — Encounter: Payer: Self-pay | Admitting: Podiatry

## 2017-03-16 ENCOUNTER — Ambulatory Visit (INDEPENDENT_AMBULATORY_CARE_PROVIDER_SITE_OTHER): Payer: Medicare Other | Admitting: Podiatry

## 2017-03-16 DIAGNOSIS — Q828 Other specified congenital malformations of skin: Secondary | ICD-10-CM | POA: Diagnosis not present

## 2017-03-16 DIAGNOSIS — M79674 Pain in right toe(s): Secondary | ICD-10-CM

## 2017-03-16 DIAGNOSIS — M79675 Pain in left toe(s): Secondary | ICD-10-CM

## 2017-03-16 DIAGNOSIS — B351 Tinea unguium: Secondary | ICD-10-CM

## 2017-03-16 NOTE — Progress Notes (Signed)
Subjective: 71 y.o. returns the office today for recurrent painful calluses to both of her feet but she states that overall they're doing much better. She also presents today for painful, elongated, thickened toenails which they cannot trim themself. Denies any redness or drainage around the nails. Denies any acute changes since last appointment and no new complaints today. Denies any systemic complaints such as fevers, chills, nausea, vomiting.   PCP: Arnoldo Morale, MD  Objective: AAO 3, NAD DP/PT pulses palpable, CRT less than 3 seconds Nails hypertrophic, dystrophic, elongated, brittle, discolored 9. There is tenderness overlying the nails 1-5 b/l except for the left 5th toe which no nail is present . There is no surrounding erythema or drainage along the nail sites. Hyperkeratotic lesion right foot submetatarsal 5. No underlying ulceration, drainage or any signs of infection. There is no Smith in hyperkeratotic lesion to the left foot today. No open lesions or pre-ulcerative lesions are identified. Lateral deviation the right second toe. No other areas of tenderness bilateral lower extremities. No overlying edema, erythema, increased warmth. No pain with calf compression, swelling, warmth, erythema.  Assessment: Patient presents with symptomatic onychomycosis; hyperkeratotic lesion  Plan: -Treatment options including alternatives, risks, complications were discussed -Nails sharply debrided 9 without complication/bleeding. -Hyperkeratotic lesion sharply debrided 1 without any complications or bleeding. -Dispensed offloading pads to help separate toes. -Discussed daily foot inspection. If there are any changes, to call the office immediately.  -Follow-up in 3 months or sooner if any problems are to arise. In the meantime, encouraged to call the office with any questions, concerns, changes symptoms.  Celesta Gentile, DPM

## 2017-04-05 ENCOUNTER — Other Ambulatory Visit: Payer: Self-pay | Admitting: Family Medicine

## 2017-04-05 DIAGNOSIS — I10 Essential (primary) hypertension: Secondary | ICD-10-CM

## 2017-04-07 ENCOUNTER — Telehealth: Payer: Self-pay | Admitting: Family Medicine

## 2017-04-07 NOTE — Telephone Encounter (Signed)
Pt called requesting a letter for utility bill saying her medical condition, she meed it by 04/09/17, please call if be able to do the letter or not

## 2017-04-09 ENCOUNTER — Other Ambulatory Visit: Payer: Self-pay | Admitting: Family Medicine

## 2017-04-09 NOTE — Telephone Encounter (Signed)
Pt called back to check on the statu of the letter request for the utility dept.

## 2017-04-09 NOTE — Telephone Encounter (Signed)
Could you please fax this letter? Thank you

## 2017-04-09 NOTE — Telephone Encounter (Signed)
Pt call to ask to please fax to to [phone] attention Briant Sites also please call her when you fax the letter to [phone], please follow up

## 2017-04-12 NOTE — Telephone Encounter (Signed)
Letter has been faxed over to [facility].

## 2017-04-13 ENCOUNTER — Ambulatory Visit: Payer: Medicare Other | Attending: Family Medicine | Admitting: Family Medicine

## 2017-04-13 ENCOUNTER — Encounter: Payer: Self-pay | Admitting: Family Medicine

## 2017-04-13 VITALS — BP 180/90 | HR 46 | Temp 97.4°F | Ht 59.0 in | Wt 117.6 lb

## 2017-04-13 DIAGNOSIS — E7800 Pure hypercholesterolemia, unspecified: Secondary | ICD-10-CM

## 2017-04-13 DIAGNOSIS — Z79891 Long term (current) use of opiate analgesic: Secondary | ICD-10-CM | POA: Insufficient documentation

## 2017-04-13 DIAGNOSIS — G8929 Other chronic pain: Secondary | ICD-10-CM

## 2017-04-13 DIAGNOSIS — M818 Other osteoporosis without current pathological fracture: Secondary | ICD-10-CM | POA: Diagnosis not present

## 2017-04-13 DIAGNOSIS — J328 Other chronic sinusitis: Secondary | ICD-10-CM

## 2017-04-13 DIAGNOSIS — R0982 Postnasal drip: Secondary | ICD-10-CM | POA: Diagnosis not present

## 2017-04-13 DIAGNOSIS — Z9889 Other specified postprocedural states: Secondary | ICD-10-CM | POA: Diagnosis not present

## 2017-04-13 DIAGNOSIS — J329 Chronic sinusitis, unspecified: Secondary | ICD-10-CM | POA: Diagnosis not present

## 2017-04-13 DIAGNOSIS — M0579 Rheumatoid arthritis with rheumatoid factor of multiple sites without organ or systems involvement: Secondary | ICD-10-CM | POA: Diagnosis not present

## 2017-04-13 DIAGNOSIS — Z79899 Other long term (current) drug therapy: Secondary | ICD-10-CM | POA: Diagnosis not present

## 2017-04-13 DIAGNOSIS — M546 Pain in thoracic spine: Secondary | ICD-10-CM | POA: Insufficient documentation

## 2017-04-13 DIAGNOSIS — I10 Essential (primary) hypertension: Secondary | ICD-10-CM

## 2017-04-13 DIAGNOSIS — M0630 Rheumatoid nodule, unspecified site: Secondary | ICD-10-CM | POA: Diagnosis not present

## 2017-04-13 MED ORDER — ALENDRONATE SODIUM 70 MG PO TABS: 70.0000 mg | ORAL_TABLET | ORAL | 6 refills | Status: DC

## 2017-04-13 MED ORDER — CETIRIZINE HCL 10 MG PO TABS: 10.0000 mg | ORAL_TABLET | Freq: Every day | ORAL | 5 refills | Status: DC

## 2017-04-13 MED ORDER — TIZANIDINE HCL 4 MG PO TABS: ORAL_TABLET | ORAL | 5 refills | Status: DC

## 2017-04-13 MED ORDER — PRAVASTATIN SODIUM 20 MG PO TABS: 20.0000 mg | ORAL_TABLET | Freq: Every day | ORAL | 5 refills | Status: DC

## 2017-04-13 MED ORDER — AMLODIPINE BESYLATE 10 MG PO TABS: 10.0000 mg | ORAL_TABLET | Freq: Every day | ORAL | 5 refills | Status: DC

## 2017-04-13 MED ORDER — FLUTICASONE PROPIONATE 50 MCG/ACT NA SUSP: 2.0000 | Freq: Every day | NASAL | 6 refills | Status: DC

## 2017-04-13 MED ORDER — CARVEDILOL 6.25 MG PO TABS: 6.2500 mg | ORAL_TABLET | Freq: Two times a day (BID) | ORAL | 5 refills | Status: DC

## 2017-04-13 NOTE — Patient Instructions (Signed)
Rheumatoid Arthritis Rheumatoid arthritis (RA) is a long-term (chronic) disease. RA causes inflammation in your joints. Your joints may feel painful, stiff, swollen, warm, or tender. RA may start slowly. Usually, it affects the small joints of the hands and feet. It can also affect other parts of the body, even the heart, eyes, or lungs. Symptoms of RA often come and go. Sometimes, symptoms get worse for a while. These are called flares. There is no cure for RA, but your doctor will work with you to find the best treatment option for you. This will depend on how the disease is changing in your body. Follow these instructions at home:  Take over-the-counter and prescription medicines only as told by your doctor. Your doctor may change (adjust) your medicines every 3 months.  Start an exercise program as told by your doctor.  Rest when you have a flare.  Return to your normal activities as told by your doctor. Ask your doctor what activities are safe for you.  Keep all follow-up visits as told by your doctor. This is important. Contact a doctor if:  You have a flare.  You have a fever.  You have problems (side effects) because of your medicines. Get help right away if:  You have chest pain.  You have trouble breathing.  You have a hot, painful joint all of a sudden, and it is worse than your usual joint aches. This information is not intended to replace advice given to you by your health care provider. Make sure you discuss any questions you have with your health care provider. Document Released: 08/03/2011 Document Revised: 10/17/2015 Document Reviewed: 02/21/2015 Elsevier Interactive Patient Education  2018 [employer]  

## 2017-04-13 NOTE — Progress Notes (Signed)
Pt has knots on elbow and in back of head.

## 2017-04-13 NOTE — Progress Notes (Signed)
Subjective:  Patient ID: Kristy Oliver, female    DOB: 02-22-1946  Age: 71 y.o. MRN: [id number]  CC: Hypertension   HPI Kristy Oliver is a 71 year old female with a history of rheumatoid arthritis, hypertension, hyperlipidemia who comes in for a follow-up visit accompanied by her grandson.  She is concerned about 'knots' on her elbows and scalp but is unsure of the duration.  Lesions are nontender and she is unsure if there have been any change in size. She previously had knots on her hands but states these are new. I am unable to ascertain if she has been taking her methotrexate as she did have a period when she was treated for latent TB and was off these medications; she has not been back to see rheumatology for a follow-up visit.  She is status post distal ulnar resection with a ECU stabilization and repair of extensor tendons of the right hand by Dr Burney Gauze (orthopedics at [facility]) and was seen for a follow-up visit last week at which time she was placed on Bactrim due to drainage from the distal aspect of the wound. She is currently on antibiotics and denies purulent discharge.  Her blood pressure is elevated and she endorses compliance with her antihypertensives; attributes elevation to stress.  Her grandson is concerned about her weight loss and decreased appetite but the patient informs me she is satisfied with her weight and her appetite and declines any medications to increase appetite or weight. He also complains the patient has been noticed to be coughing.  The patient herself denies sinus pressure, productive cough or chest pain and has no shortness of breath.  She describes the cough is dry.  Past Medical History:  Diagnosis Date  . Allergy   . Arthritis   . Asthma    as young person, none currently  . Facial cellulitis 11/17/2012  . Gout   . Helicobacter pylori gastritis 12/13/2016  . HTN (hypertension), malignant 11/17/2012  . Hx of adenomatous  colonic polyps 12/29/2016  . Hyperlipidemia    medicine currently  . Hypertension   . Osteoporosis   . Renal disorder   . Tuberculosis    pt took meds for 9 months - finish meds last week     Past Surgical History:  Procedure Laterality Date  . BREAST LUMPECTOMY    . c-section x 2    . COLONOSCOPY    . JOINT REPLACEMENT     left knee & left shoulder  . left breast cyst removal    . UPPER GASTROINTESTINAL ENDOSCOPY      Allergies  Allergen Reactions  . Clindamycin Hives, Rash and Swelling  . Iodinated Diagnostic Agents     unknown  . Metrizamide     unknown  . Motrin [Ibuprofen] Other (See Comments)    blackout States she is only allergic to 800 mg formulation  . Milk-Related Compounds Diarrhea  . Shrimp [Shellfish Allergy] Hives, Swelling and Rash     Outpatient Medications Prior to Visit  Medication Sig Dispense Refill  . folic acid (FOLVITE) 549 MCG tablet Take 1 tablet (400 mcg total) by mouth daily. 30 tablet 5  . lactulose (CHRONULAC) 10 GM/15ML solution TAKE 15 MLS BY MOUTH THREE TIMES A DAY 946 mL 1  . carvedilol (COREG) 12.5 MG tablet Take 1 tablet (12.5 mg total) 2 (two) times daily with a meal by mouth. 60 tablet 0  . cetirizine (ZYRTEC) 10 MG tablet Take 1 tablet (10 mg total) by  mouth daily. 30 tablet 5  . fluticasone (FLONASE) 50 MCG/ACT nasal spray Place 2 sprays into both nostrils daily. 16 g 6  . pravastatin (PRAVACHOL) 20 MG tablet Take 1 tablet (20 mg total) by mouth daily. 30 tablet 5  . tiZANidine (ZANAFLEX) 4 MG tablet TAKE 1 TABLET BY MOUTH EVERY 8 HOURS AS NEEDED FOR MUSCLE SPASMS 90 tablet 5  . clotrimazole (LOTRIMIN) 1 % cream APPLY TO AFFECTED AREA ON SKIN TWICE A DAY (Patient not taking: Reported on 04/13/2017) 30 g 1  . colchicine 0.6 MG tablet Take 1 tablet (0.6 mg total) by mouth daily. (Patient not taking: Reported on 04/13/2017) 30 tablet 3  . dronabinol (MARINOL) 2.5 MG capsule Take 1 capsule (2.5 mg total) by mouth 2 (two) times daily  before lunch and supper. (Patient not taking: Reported on 11/10/2016) 60 capsule 3  . hydroxychloroquine (PLAQUENIL) 200 MG tablet Take 400 mg by mouth.    . lidocaine (XYLOCAINE) 2 % solution USE AS DIRECTED 15MLS IN THE MOUTH OR THROAT AS NEEDED FOR MOUTH PAIN (Patient not taking: Reported on 04/13/2017) 100 mL 0  . omeprazole (PRILOSEC) 20 MG capsule Take 1 capsule (20 mg total) by mouth 2 (two) times daily. (Patient not taking: Reported on 12/24/2016) 28 capsule 0  . rifampin (RIFADIN) 150 MG capsule Take 300 mg by mouth daily.    Marland Kitchen terbinafine (LAMISIL AT) 1 % cream Apply 1 application topically 2 (two) times daily. (Patient not taking: Reported on 04/13/2017) 30 g 2  . traMADol (ULTRAM) 50 MG tablet Take 1 tablet (50 mg total) by mouth every 12 (twelve) hours as needed. (Patient not taking: Reported on 04/13/2017) 60 tablet 2  . alendronate (FOSAMAX) 70 MG tablet Take 1 tablet (70 mg total) by mouth once a week. Take with a full glass of water on an empty stomach. (Patient not taking: Reported on 12/07/2016) 4 tablet 6   Facility-Administered Medications Prior to Visit  Medication Dose Route Frequency Provider Last Rate Last Dose  . 0.9 %  sodium chloride infusion  500 mL Intravenous Continuous Gatha Mayer, MD      . 0.9 %  sodium chloride infusion  500 mL Intravenous Continuous Gatha Mayer, MD        ROS Review of Systems  Constitutional: Negative for activity change, appetite change and fatigue.  HENT: Negative for congestion, sinus pressure and sore throat.   Eyes: Negative for visual disturbance.  Respiratory: Positive for choking. Negative for cough, chest tightness, shortness of breath and wheezing.   Cardiovascular: Negative for chest pain and palpitations.  Gastrointestinal: Negative for abdominal distention, abdominal pain and constipation.  Endocrine: Negative for polydipsia.  Genitourinary: Negative for dysuria and frequency.  Musculoskeletal: Positive for arthralgias  and back pain.  Skin: Negative for rash.  Neurological: Negative for tremors, light-headedness and numbness.  Hematological: Does not bruise/bleed easily.  Psychiatric/Behavioral: Negative for agitation and behavioral problems.    Objective:  BP (!) 180/90   Pulse (!) 46   Temp (!) 97.4 F (36.3 C) (Oral)   Ht 4' 11"  (1.499 m)   Wt 117 lb 9.6 oz (53.3 kg)   SpO2 95%   BMI 23.75 kg/m   BP/Weight 04/13/2017 12/24/2016 09/27/3974  Systolic BP 734 193 790  Diastolic BP 90 99 87  Wt. (Lbs) 117.6 124 124  BMI 23.75 25.04 25.04      Physical Exam  Constitutional: She is oriented to person, place, and time. She appears well-developed and well-nourished.  Cardiovascular: Normal heart sounds and intact distal pulses. Bradycardia present.  No murmur heard. Pulmonary/Chest: Effort normal and breath sounds normal. She has no wheezes. She has no rales. She exhibits no tenderness.  Abdominal: Soft. Bowel sounds are normal. She exhibits no distension and no mass. There is no tenderness.  Musculoskeletal:  Nodules which are hard measuring about 0.5 cm in diameter on the skull rheumatoid nodules in both elbows Boutonniere deformities of fingers Dorsum of right hand with wound, not tender, no drainage  Neurological: She is alert and oriented to person, place, and time.  Skin: Skin is warm and dry.  Psychiatric: She has a normal mood and affect.     CMP Latest Ref Rng & Units 04/13/2017 11/02/2016 05/11/2016  Glucose 65 - 99 mg/dL 88 97 81  BUN 8 - 27 mg/dL 9 6(L) 12  Creatinine 0.57 - 1.00 mg/dL 0.84 0.69 0.87  Sodium 134 - 144 mmol/L 140 142 141  Potassium 3.5 - 5.2 mmol/L 4.6 3.9 4.6  Chloride 96 - 106 mmol/L 108(H) 107(H) 106  CO2 20 - 29 mmol/L 17(L) 20 26  Calcium 8.7 - 10.3 mg/dL 9.2 9.4 9.8  Total Protein 6.0 - 8.5 g/dL 9.2(H) - 8.1  Total Bilirubin 0.0 - 1.2 mg/dL 0.4 - 0.4  Alkaline Phos 39 - 117 IU/L 223(H) - 256(H)  AST 0 - 40 IU/L 31 - 27  ALT 0 - 32 IU/L 11 - 16     Lipid Panel     Component Value Date/Time   CHOL 250 (H) 07/02/2015 0915   TRIG 102 07/02/2015 0915   HDL 96 07/02/2015 0915   CHOLHDL 2.6 07/02/2015 0915   VLDL 20 07/02/2015 0915   LDLCALC 134 (H) 07/02/2015 0915    Assessment & Plan:   1. Other osteoporosis without current pathological fracture Secondary to prolonged steroid use. - alendronate (FOSAMAX) 70 MG tablet; Take 1 tablet (70 mg total) by mouth once a week. Take with a full glass of water on an empty stomach.  Dispense: 4 tablet; Refill: 6  2. Essential hypertension Controlled Carvedilol dose decreased due to bradycardia Amlodipine added to regimen Low sodium, DASH diet - carvedilol (COREG) 6.25 MG tablet; Take 1 tablet (6.25 mg total) by mouth 2 (two) times daily with a meal.  Dispense: 60 tablet; Refill: 5 - amLODipine (NORVASC) 10 MG tablet; Take 1 tablet (10 mg total) by mouth daily.  Dispense: 30 tablet; Refill: 5 - CMP14+EGFR - CBC with Differential/Platelet  3. Other chronic sinusitis Could explained cough Continue Zyrtec - fluticasone (FLONASE) 50 MCG/ACT nasal spray; Place 2 sprays into both nostrils daily.  Dispense: 16 g; Refill: 6  4. Pure hypercholesterolemia Uncontrolled due to noncompliance Emphasized compliance with low-cholesterol diet - pravastatin (PRAVACHOL) 20 MG tablet; Take 1 tablet (20 mg total) by mouth daily.  Dispense: 30 tablet; Refill: 5  5. Chronic midline thoracic back pain Uncontrolled Secondary to kyphoscoliosis Patient refuses to use back brace - tiZANidine (ZANAFLEX) 4 MG tablet; TAKE 1 TABLET BY MOUTH EVERY 12 HOURS AS NEEDED FOR MUSCLE SPASMS  Dispense: 60 tablet; Refill: 5  6. Post-nasal drip - cetirizine (ZYRTEC) 10 MG tablet; Take 1 tablet (10 mg total) by mouth daily.  Dispense: 30 tablet; Refill: 5  7. Rheumatoid arthritis involving multiple sites with positive rheumatoid factor (HCC) Have provided the number to her rheumatologist Dr Estanislado Pandy she has not been  seen there in a while. I am unable to ascertain compliance with rheumatoid medications as she is here  with her grandson does not seem to know. Follow-up with orthopedics regarding rheumatoid deformities in extremities She previously had rheumatoid nodules in her hands and elbows and the new lesions she complains about are suspicious for new rheumatoid nodules Ordered skull x-ray due to presence of rheumatoid nodules on skull   Meds ordered this encounter  Medications  . alendronate (FOSAMAX) 70 MG tablet    Sig: Take 1 tablet (70 mg total) by mouth once a week. Take with a full glass of water on an empty stomach.    Dispense:  4 tablet    Refill:  6  . carvedilol (COREG) 6.25 MG tablet    Sig: Take 1 tablet (6.25 mg total) by mouth 2 (two) times daily with a meal.    Dispense:  60 tablet    Refill:  5    Discontinue previous dose  . fluticasone (FLONASE) 50 MCG/ACT nasal spray    Sig: Place 2 sprays into both nostrils daily.    Dispense:  16 g    Refill:  6  . pravastatin (PRAVACHOL) 20 MG tablet    Sig: Take 1 tablet (20 mg total) by mouth daily.    Dispense:  30 tablet    Refill:  5  . amLODipine (NORVASC) 10 MG tablet    Sig: Take 1 tablet (10 mg total) by mouth daily.    Dispense:  30 tablet    Refill:  5  . tiZANidine (ZANAFLEX) 4 MG tablet    Sig: TAKE 1 TABLET BY MOUTH EVERY 12 HOURS AS NEEDED FOR MUSCLE SPASMS    Dispense:  60 tablet    Refill:  5  . cetirizine (ZYRTEC) 10 MG tablet    Sig: Take 1 tablet (10 mg total) by mouth daily.    Dispense:  30 tablet    Refill:  5    Follow-up: Return in about 3 months (around 07/14/2017) for Follow-up of chronic medical conditions.   Arnoldo Morale MD

## 2017-04-14 ENCOUNTER — Other Ambulatory Visit: Payer: Self-pay | Admitting: Family Medicine

## 2017-04-14 ENCOUNTER — Telehealth: Payer: Self-pay

## 2017-04-14 ENCOUNTER — Encounter: Payer: Self-pay | Admitting: Family Medicine

## 2017-04-14 DIAGNOSIS — M0579 Rheumatoid arthritis with rheumatoid factor of multiple sites without organ or systems involvement: Secondary | ICD-10-CM

## 2017-04-14 LAB — CBC WITH DIFFERENTIAL/PLATELET
Basophils Absolute: 0.1 10*3/uL (ref 0.0–0.2)
Basos: 1 %
EOS (ABSOLUTE): 0.3 10*3/uL (ref 0.0–0.4)
EOS: 3 %
HEMOGLOBIN: 14.5 g/dL (ref 11.1–15.9)
Hematocrit: 41.6 % (ref 34.0–46.6)
Immature Grans (Abs): 0 10*3/uL (ref 0.0–0.1)
Immature Granulocytes: 0 %
LYMPHS ABS: 1.7 10*3/uL (ref 0.7–3.1)
Lymphs: 18 %
MCH: 31.1 pg (ref 26.6–33.0)
MCHC: 34.9 g/dL (ref 31.5–35.7)
MCV: 89 fL (ref 79–97)
MONOCYTES: 4 %
Monocytes Absolute: 0.4 10*3/uL (ref 0.1–0.9)
NEUTROS ABS: 7.3 10*3/uL — AB (ref 1.4–7.0)
Neutrophils: 74 %
Platelets: 284 10*3/uL (ref 150–379)
RBC: 4.66 x10E6/uL (ref 3.77–5.28)
RDW: 14 % (ref 12.3–15.4)
WBC: 9.8 10*3/uL (ref 3.4–10.8)

## 2017-04-14 LAB — CMP14+EGFR
ALT: 11 IU/L (ref 0–32)
AST: 31 IU/L (ref 0–40)
Albumin/Globulin Ratio: 0.5 — ABNORMAL LOW (ref 1.2–2.2)
Albumin: 3.2 g/dL — ABNORMAL LOW (ref 3.6–4.8)
Alkaline Phosphatase: 223 IU/L — ABNORMAL HIGH (ref 39–117)
BILIRUBIN TOTAL: 0.4 mg/dL (ref 0.0–1.2)
BUN/Creatinine Ratio: 11 — ABNORMAL LOW (ref 12–28)
BUN: 9 mg/dL (ref 8–27)
CALCIUM: 9.2 mg/dL (ref 8.7–10.3)
CHLORIDE: 108 mmol/L — AB (ref 96–106)
CO2: 17 mmol/L — ABNORMAL LOW (ref 20–29)
Creatinine, Ser: 0.84 mg/dL (ref 0.57–1.00)
GFR calc non Af Amer: 72 mL/min/{1.73_m2} (ref >59)
GFR, EST AFRICAN AMERICAN: 83 mL/min/{1.73_m2} (ref >59)
GLUCOSE: 88 mg/dL (ref 65–99)
Globulin, Total: 6 g/dL — ABNORMAL HIGH (ref 1.5–4.5)
Potassium: 4.6 mmol/L (ref 3.5–5.2)
Sodium: 140 mmol/L (ref 134–144)
TOTAL PROTEIN: 9.2 g/dL — AB (ref 6.0–8.5)

## 2017-04-14 NOTE — Telephone Encounter (Signed)
Pt grandson was called and informed of X-ray order being placed.

## 2017-04-14 NOTE — Addendum Note (Signed)
Addended by: Octaviano Glow on: 04/14/2017 11:54 AM   Modules accepted: Orders

## 2017-04-19 ENCOUNTER — Telehealth (INDEPENDENT_AMBULATORY_CARE_PROVIDER_SITE_OTHER): Payer: Self-pay

## 2017-04-19 ENCOUNTER — Ambulatory Visit (HOSPITAL_BASED_OUTPATIENT_CLINIC_OR_DEPARTMENT_OTHER)
Admission: RE | Admit: 2017-04-19 | Discharge: 2017-04-19 | Disposition: A | Discharge status: Home / Self Care | Payer: Medicare Other | Source: Ambulatory Visit | Attending: Family Medicine | Admitting: Family Medicine

## 2017-04-19 DIAGNOSIS — R220 Localized swelling, mass and lump, head: Secondary | ICD-10-CM | POA: Diagnosis not present

## 2017-04-19 DIAGNOSIS — M0630 Rheumatoid nodule, unspecified site: Secondary | ICD-10-CM | POA: Diagnosis not present

## 2017-04-19 NOTE — Telephone Encounter (Signed)
Patient has been scheduled for 04/21/17.

## 2017-04-19 NOTE — Progress Notes (Signed)
Office Visit Note  Patient: Kristy Oliver             Date of Birth: 05/11/1946           MRN: [id number]             PCP: Arnoldo Morale, MD Referring: Arnoldo Morale, MD Visit Date: 04/21/2017 Occupation: @GUAROCC @    Subjective:  Pain in hands , knees and feet.   History of Present Illness: Kristy Oliver is a 71 y.o. female with history of sero positive rheumatoid arthritis. She was treated by Dr. Charlestine Night in the past with methotrexate and Plaquenil and prednisone. She was taken off the methotrexate due to him positive TB. She is accompanied by her grandson today Kristy Oliver. According to him arm she finished her rifampin course at [facility] about 3 months ago. She's not been taking any medication for arthritis. She's been having pain and discomfort in her bilateral hands bilateral elbows bilateral shoulders bilateral knees and bilateral feet.  Activities of Daily Living:  Patient reports morning stiffness for all day hours.   Patient Reports nocturnal pain.  Difficulty dressing/grooming: Reports Difficulty climbing stairs: Reports Difficulty getting out of chair: Reports Difficulty using hands for taps, buttons, cutlery, and/or writing: Reports   Review of Systems  Constitutional: Positive for fatigue. Negative for night sweats, weight gain, weight loss and weakness.  HENT: Negative for mouth sores, trouble swallowing, trouble swallowing, mouth dryness and nose dryness.   Eyes: Negative for pain, redness, visual disturbance and dryness.  Respiratory: Positive for shortness of breath. Negative for cough and difficulty breathing.   Cardiovascular: Negative for chest pain, palpitations, hypertension, irregular heartbeat and swelling in legs/feet.  Gastrointestinal: Negative for blood in stool, constipation and diarrhea.  Endocrine: Negative for increased urination.  Genitourinary: Negative for vaginal dryness.  Musculoskeletal: Positive for arthralgias, joint  pain, joint swelling and morning stiffness. Negative for myalgias, muscle weakness, muscle tenderness and myalgias.  Skin: Negative for color change, rash, hair loss, skin tightness, ulcers and sensitivity to sunlight.  Allergic/Immunologic: Negative for susceptible to infections.  Neurological: Negative for dizziness, memory loss and night sweats.  Hematological: Negative for swollen glands.  Psychiatric/Behavioral: Positive for depressed mood and sleep disturbance. The patient is nervous/anxious.     PMFS History:  Patient Active Problem List   Diagnosis Date Noted  . Hx of adenomatous colonic polyps 12/29/2016  . Helicobacter pylori gastritis 12/13/2016  . History of asthma 07/16/2016  . History of depression 07/16/2016  . Compression fracture of body of thoracic vertebra ([place]) 07/16/2016  . Osteoporosis 07/07/2016  . 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 Moderate asthma without complication 22/97/9892  . High risk medication use 05/08/2016  . History of left shoulder replacement 05/08/2016  . Back pain 02/24/2016  . Tinea pedis 09/23/2015  . Depression 09/23/2015  . Hyperlipidemia 07/04/2015  . Chronic sinusitis 06/27/2015  . HTN (hypertension) 10/12/2013  . Smoker 10/12/2013  . Bloating symptom 10/12/2013  . RA (rheumatoid arthritis) ([place]) 06/30/2013  . Tobacco use 06/30/2013  . Poor dentition 11/17/2012    Past Medical History:  Diagnosis Date  . Allergy   . Arthritis   . Asthma    as young person, none currently  . Facial cellulitis 11/17/2012  . Gout   . Helicobacter pylori gastritis 12/13/2016  . HTN (hypertension), malignant 11/17/2012  . Hx of adenomatous colonic polyps 12/29/2016  . Hyperlipidemia    medicine currently  . Hypertension   . Osteoporosis   .  Renal disorder   . Tuberculosis    pt took meds for 9 months - finish meds last week     Family History  Problem Relation Age of Onset  . Colon cancer Neg Hx   . Esophageal cancer Neg Hx   .  Rectal cancer Neg Hx   . Stomach cancer Neg Hx    Past Surgical History:  Procedure Laterality Date  . BREAST LUMPECTOMY    . c-section x 2    . COLONOSCOPY    . JOINT REPLACEMENT     left knee & left shoulder  . left breast cyst removal    . UPPER GASTROINTESTINAL ENDOSCOPY     Social History   Social History Narrative  . Not on file     Objective: Vital Signs: BP 125/77 (BP Location: Left Arm, Patient Position: Sitting, Cuff Size: Normal)   Pulse (!) 55   Resp 17   Ht 4' 11"  (1.499 m)   Wt 116 lb (52.6 kg)   BMI 23.43 kg/m    Physical Exam  Constitutional: She is oriented to person, place, and time. She appears well-developed and well-nourished.  HENT:  Head: Normocephalic and atraumatic.  Eyes: Conjunctivae and EOM are normal.  Neck: Normal range of motion.  Cardiovascular: Normal rate, regular rhythm, normal heart sounds and intact distal pulses.  Pulmonary/Chest: Effort normal and breath sounds normal.  Abdominal: Soft. Bowel sounds are normal.  Lymphadenopathy:    She has no cervical adenopathy.  Neurological: She is alert and oriented to person, place, and time.  Skin: Skin is warm and dry. Capillary refill takes less than 2 seconds.  Psychiatric: She has a normal mood and affect. Her behavior is normal.  Nursing note and vitals reviewed.    Musculoskeletal Exam: C-spine limited range of motion with discomfort. She has severe thoracic kyphosis. She has shoulder joint abduction limited to 90 bilaterally. Her left shoulder joint has been replaced in the past. She has rheumatoid nodules over bilateral elbows and bilateral hands. She also has solid from bursitis of her elbows. She has limited range of motion of bilateral wrist joints. No synovitis was noted over her MCP joints but synovial thickening was noted. Hip joints knee joints are good range of motion. She is warmth and swelling in her bilateral knee joints and ankle joints.  CDAI Exam: CDAI Homunculus  Exam:   Tenderness:  RUE: glenohumeral, ulnohumeral and radiohumeral and wrist LUE: glenohumeral, ulnohumeral and radiohumeral and wrist Right hand: 1st MCP, 2nd MCP, 3rd MCP, 4th MCP and 5th MCP Left hand: 1st MCP, 2nd MCP, 3rd MCP, 4th MCP and 5th MCP RLE: tibiofemoral and tibiotalar LLE: tibiofemoral and tibiotalar Right foot: 4th MTP Left foot: 2nd MTP and 3rd MTP  Swelling:  RUE: wrist LUE: ulnohumeral and radiohumeral and wrist Right hand: 2nd MCP and 3rd MCP Left hand: 2nd MCP RLE: tibiofemoral and tibiotalar LLE: tibiofemoral and tibiotalar  Joint Counts:  CDAI Tender Joint count: 18 CDAI Swollen Joint count: 8  Global Assessments:  Patient Global Assessment: 9 Provider Global Assessment: 9  CDAI Calculated Score: 44    Investigation: No additional findings. CBC Latest Ref Rng & Units 04/13/2017 11/02/2016 05/11/2016  WBC 3.4 - 10.8 x10E3/uL 9.8 5.8 13.3(H)  Hemoglobin 11.1 - 15.9 g/dL 14.5 14.6 13.1  Hematocrit 34.0 - 46.6 % 41.6 40.6 40.1  Platelets 150 - 379 x10E3/uL 284 231 406(H)   CMP Latest Ref Rng & Units 04/13/2017 04/13/2017 11/02/2016  Glucose 65 - 99 mg/dL -  88 97  BUN 8 - 27 mg/dL - 9 6(L)  Creatinine 0.57 - 1.00 mg/dL - 0.84 0.69  Sodium 134 - 144 mmol/L - 140 142  Potassium 3.5 - 5.2 mmol/L - 4.6 3.9  Chloride 96 - 106 mmol/L - 108(H) 107(H)  CO2 20 - 29 mmol/L - 17(L) 20  Calcium 8.7 - 10.3 mg/dL - 9.2 9.4  Total Protein 6.0 - 8.5 g/dL 9.2(H) 9.2(H) -  Total Bilirubin 0.0 - 1.2 mg/dL - 0.4 -  Alkaline Phos 39 - 117 IU/L - 223(H) -  AST 0 - 40 IU/L - 31 -  ALT 0 - 32 IU/L - 11 -  05/11/2016 SPEP was negative. TB gold was positive. IgG elevated at 1979, hepatitis A, B and C  were negative. HIV negative. ESR greater than 130  Imaging: Dg Skull Complete  Result Date: 04/19/2017 CLINICAL DATA:  Nodules on back of head for 2 months. EXAM: SKULL - COMPLETE 4 + VIEW COMPARISON:  None. FINDINGS: Small lucency in the occipital bone, on the  lateral view, may represent a venous lake. No additional areas of abnormal lucency in the skull. IMPRESSION: Probable venous lake in the occipital bone.  No acute findings. Electronically Signed   By: Lorin Picket M.D.   On: 04/19/2017 16:32    Speciality Comments: No specialty comments available.    Procedures:  No procedures performed Allergies: Clindamycin; Iodinated diagnostic agents; Metrizamide; Motrin [ibuprofen]; Milk-related compounds; and Shrimp [shellfish allergy]   Assessment / Plan:     Visit Diagnoses: Rheumatoid arthritis involving multiple sites with positive rheumatoid factor ([place]) - She has severe end-stage rheumatoid arthritis with multiple contractures and nodulosis. Her methotrexate was discontinued due to positive TB gold. Patient's grandson reports the patient finished rifampin at [facility]. She does not want to take prednisone or Plaquenil. She wants to resume methotrexate. She was treated with 8 tablets of methotrexate in the past. Once we get her note from [facility] on completion of her rifampin we should be able to start her on methotrexate 6 tablets by mouth every week along with folic acid 2 mg by mouth daily. If her labs stays stable (increase her methotrexate to 8 tablets by mouth every week.  Drug Counseling TB Gold: Positive. Treated with rifampin Hepatitis panel: Negative  Chest-xray:  [facility]  Alcohol use: Discussed  Patient was counseled on the purpose, proper use, and adverse effects of methotrexate including nausea, infection, and signs and symptoms of pneumonitis.  Reviewed instructions with patient to take methotrexate weekly along with folic acid daily.  Discussed the importance of frequent monitoring of kidney and liver function and blood counts, and provided patient with standing lab instructions.  Counseled patient to avoid NSAIDs and alcohol while on methotrexate.  Provided patient with  educational materials on methotrexate and answered all questions.  Advised patient to get annual influenza vaccine and to get a pneumococcal vaccine if patient has not already had one.  Patient voiced understanding.  Patient consented to methotrexate use.  Will upload into chart.    High risk medication use - She is off methotrexate 8 tablets by mouth every week, Plaquenil 200 mg by mouth twice a day, prednisone 10 mg by mouth daily. Eye exam at Dr. Katy Fitch May 2017 .  Other osteoporosis without current pathological fracture - with multiple thoracic vertebral fractures. She was evaluated by neurosurgeon and was not a surgical candidate she is an brace currently. She is on Fosamax  by her PCP.  Other secondary kyphosis, thoracic region: Correction of partial was discussed. We are waiting on the rifampin pending  History of left shoulder replacement - She has limited range of motion.  Chronic pain: Patient requested pain management. We will refer her to pain management.  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  Poor dentition : Good dental hygiene discussed.   Orders: Orders Placed This Encounter  Procedures  . Ambulatory referral to Pain Clinic   No orders of the defined types were placed in this encounter.   Face-to-face time spent with patient was 50 minutes. Greater than 50% of time was spent in counseling and coordination of care.  Follow-Up Instructions: Return in about 3 months (around 07/22/2017) for Rheumatoid arthritis.   Bo Merino, MD  Note - This record has been created using Editor, commissioning.  Chart creation errors have been sought, but may not always  have been located. Such creation errors do not reflect on  the standard of medical care.

## 2017-04-19 NOTE — Telephone Encounter (Addendum)
[name] called stating that patient needs to be seen by Dr. Estanislado Pandy as soon as possible per patient PCP due to having knots on her scalp.  Patient last appt.was in Feb. 2018.  CB# is [phone].  Please advise. Thank you.

## 2017-04-20 ENCOUNTER — Other Ambulatory Visit: Payer: Self-pay | Admitting: Family Medicine

## 2017-04-20 DIAGNOSIS — R779 Abnormality of plasma protein, unspecified: Secondary | ICD-10-CM

## 2017-04-20 DIAGNOSIS — E8809 Other disorders of plasma-protein metabolism, not elsewhere classified: Secondary | ICD-10-CM

## 2017-04-21 ENCOUNTER — Ambulatory Visit (INDEPENDENT_AMBULATORY_CARE_PROVIDER_SITE_OTHER): Payer: Medicare Other | Admitting: Rheumatology

## 2017-04-21 ENCOUNTER — Encounter: Payer: Self-pay | Admitting: Rheumatology

## 2017-04-21 VITALS — BP 125/77 | HR 55 | Resp 17 | Ht 59.0 in | Wt 116.0 lb

## 2017-04-21 DIAGNOSIS — Z79899 Other long term (current) drug therapy: Secondary | ICD-10-CM

## 2017-04-21 DIAGNOSIS — Z96612 Presence of left artificial shoulder joint: Secondary | ICD-10-CM | POA: Diagnosis not present

## 2017-04-21 DIAGNOSIS — M4014 Other secondary kyphosis, thoracic region: Secondary | ICD-10-CM

## 2017-04-21 DIAGNOSIS — R7612 Nonspecific reaction to cell mediated immunity measurement of gamma interferon antigen response without active tuberculosis: Secondary | ICD-10-CM

## 2017-04-21 DIAGNOSIS — M0579 Rheumatoid arthritis with rheumatoid factor of multiple sites without organ or systems involvement: Secondary | ICD-10-CM

## 2017-04-21 DIAGNOSIS — Z8659 Personal history of other mental and behavioral disorders: Secondary | ICD-10-CM

## 2017-04-21 DIAGNOSIS — Z8709 Personal history of other diseases of the respiratory system: Secondary | ICD-10-CM

## 2017-04-21 DIAGNOSIS — Z720 Tobacco use: Secondary | ICD-10-CM | POA: Diagnosis not present

## 2017-04-21 DIAGNOSIS — K089 Disorder of teeth and supporting structures, unspecified: Secondary | ICD-10-CM | POA: Diagnosis not present

## 2017-04-21 DIAGNOSIS — G8929 Other chronic pain: Secondary | ICD-10-CM

## 2017-04-21 DIAGNOSIS — M818 Other osteoporosis without current pathological fracture: Secondary | ICD-10-CM | POA: Diagnosis not present

## 2017-04-21 LAB — MULTIPLE MYELOMA PANEL, SERUM: TOTAL PROTEIN: 9.2 g/dL — AB (ref 6.0–8.5)

## 2017-04-21 LAB — SPECIMEN STATUS REPORT

## 2017-04-21 NOTE — Patient Instructions (Addendum)
Standing Labs We placed an order today for your standing lab work.    Please come back and get your standing labs in 2 weeks 2 and then every 2 months after starting methotrexate.  We have open lab Monday through Friday from 8:30-11:30 AM and 1:30-4 PM at the office of Dr. Bo Merino.   The office is located at [address] [place], [place], [place] [address] No appointment is necessary.   Labs are drawn by [facility].  You may receive a bill from [facility] for your lab work. If you have any questions regarding directions or hours of operation,  please call [phone].    Methotrexate tablets What is this medicine? METHOTREXATE (METH oh TREX ate) is a chemotherapy drug used to treat cancer including breast cancer, leukemia, and lymphoma. This medicine can also be used to treat psoriasis and certain kinds of arthritis. This medicine may be used for other purposes; ask your health care provider or pharmacist if you have questions. COMMON BRAND NAME(S): Rheumatrex, Trexall What should I tell my health care provider before I take this medicine? They need to know if you have any of these conditions: -fluid in the stomach area or lungs -if you often drink alcohol -infection or immune system problems -kidney disease or on hemodialysis -liver disease -low blood counts, like low white cell, platelet, or red cell counts -lung disease -radiation therapy -stomach ulcers -ulcerative colitis -an unusual or allergic reaction to methotrexate, other medicines, foods, dyes, or preservatives -pregnant or trying to get pregnant -breast-feeding How should I use this medicine? Take this medicine by mouth with a glass of water. Follow the directions on the prescription label. Take your medicine at regular intervals. Do not take it more often than directed. Do not stop taking except on your doctor's advice. Make sure you know why you are taking this medicine and how often you should take it. If  this medicine is used for a condition that is not cancer, like arthritis or psoriasis, it should be taken weekly, NOT daily. Taking this medicine more often than directed can cause serious side effects, even death. Talk to your healthcare provider about safe handling and disposal of this medicine. You may need to take special precautions. Talk to your pediatrician regarding the use of this medicine in children. While this drug may be prescribed for selected conditions, precautions do apply. Overdosage: If you think you have taken too much of this medicine contact a poison control center or emergency room at once. NOTE: This medicine is only for you. Do not share this medicine with others. What if I miss a dose? If you miss a dose, talk with your doctor or health care professional. Do not take double or extra doses. What may interact with this medicine? This medicine may interact with the following medication: -acitretin -aspirin and aspirin-like medicines including salicylates -azathioprine -certain antibiotics like penicillins, tetracycline, and chloramphenicol -cyclosporine -gold -hydroxychloroquine -live virus vaccines -NSAIDs, medicines for pain and inflammation, like ibuprofen or naproxen -other cytotoxic agents -penicillamine -phenylbutazone -phenytoin -probenecid -retinoids such as isotretinoin and tretinoin -steroid medicines like prednisone or cortisone -sulfonamides like sulfasalazine and trimethoprim/sulfamethoxazole -theophylline This list may not describe all possible interactions. Give your health care provider a list of all the medicines, herbs, non-prescription drugs, or dietary supplements you use. Also tell them if you smoke, drink alcohol, or use illegal drugs. Some items may interact with your medicine. What should I watch for while using this medicine? Avoid alcoholic drinks. This medicine  can make you more sensitive to the sun. Keep out of the sun. If you cannot  avoid being in the sun, wear protective clothing and use sunscreen. Do not use sun lamps or tanning beds/booths. You may need blood work done while you are taking this medicine. Call your doctor or health care professional for advice if you get a fever, chills or sore throat, or other symptoms of a cold or flu. Do not treat yourself. This drug decreases your body's ability to fight infections. Try to avoid being around people who are sick. This medicine may increase your risk to bruise or bleed. Call your doctor or health care professional if you notice any unusual bleeding. Check with your doctor or health care professional if you get an attack of severe diarrhea, nausea and vomiting, or if you sweat a lot. The loss of too much body fluid can make it dangerous for you to take this medicine. Talk to your doctor about your risk of cancer. You may be more at risk for certain types of cancers if you take this medicine. Both men and women must use effective birth control with this medicine. Do not become pregnant while taking this medicine or until at least 1 normal menstrual cycle has occurred after stopping it. Women should inform their doctor if they wish to become pregnant or think they might be pregnant. Men should not father a child while taking this medicine and for 3 months after stopping it. There is a potential for serious side effects to an unborn child. Talk to your health care professional or pharmacist for more information. Do not breast-feed an infant while taking this medicine. What side effects may I notice from receiving this medicine? Side effects that you should report to your doctor or health care professional as soon as possible: -allergic reactions like skin rash, itching or hives, swelling of the face, lips, or tongue -breathing problems or shortness of breath -diarrhea -dry, nonproductive cough -low blood counts - this medicine may decrease the number of white blood cells, red blood  cells and platelets. You may be at increased risk for infections and bleeding. -mouth sores -redness, blistering, peeling or loosening of the skin, including inside the mouth -signs of infection - fever or chills, cough, sore throat, pain or trouble passing urine -signs and symptoms of bleeding such as bloody or black, tarry stools; red or dark-brown urine; spitting up blood or brown material that looks like coffee grounds; red spots on the skin; unusual bruising or bleeding from the eye, gums, or nose -signs and symptoms of kidney injury like trouble passing urine or change in the amount of urine -signs and symptoms of liver injury like dark yellow or brown urine; general ill feeling or flu-like symptoms; light-colored stools; loss of appetite; nausea; right upper belly pain; unusually weak or tired; yellowing of the eyes or skin Side effects that usually do not require medical attention (report to your doctor or health care professional if they continue or are bothersome): -dizziness -hair loss -tiredness -upset stomach -vomiting This list may not describe all possible side effects. Call your doctor for medical advice about side effects. You may report side effects to FDA at 1-800-FDA-1088. Where should I keep my medicine? Keep out of the reach of children. Store at room temperature between 20 and 25 degrees C (68 and 77 degrees F). Protect from light. Throw away any unused medicine after the expiration date. NOTE: This sheet is a summary. It may not cover  all possible information. If you have questions about this medicine, talk to your doctor, pharmacist, or health care provider.  2018 Elsevier/Gold Standard (2015-01-14 05:39:22)

## 2017-05-11 ENCOUNTER — Other Ambulatory Visit: Payer: Self-pay | Admitting: Family Medicine

## 2017-05-11 ENCOUNTER — Other Ambulatory Visit: Payer: Self-pay | Admitting: Rheumatology

## 2017-05-11 DIAGNOSIS — M0579 Rheumatoid arthritis with rheumatoid factor of multiple sites without organ or systems involvement: Secondary | ICD-10-CM

## 2017-05-11 DIAGNOSIS — K5909 Other constipation: Secondary | ICD-10-CM

## 2017-05-12 ENCOUNTER — Telehealth: Payer: Self-pay

## 2017-05-12 NOTE — Telephone Encounter (Signed)
Patient granddaughter returned call.  Advised her that patient needs an updated PLQ eye exam per Wayne Sever.

## 2017-05-12 NOTE — Telephone Encounter (Addendum)
Last Visit: 04/21/17 Next Visit due February 2019. Message sent to front to schedule patient  Labs: 04/13/17 stable PLQ Eye Exam: 09/2015  Left message for patient to call the office. Needs update PLQ eye exam.   Okay to refill 30 day supply per Dr. Estanislado Pandy

## 2017-06-08 ENCOUNTER — Other Ambulatory Visit: Payer: Self-pay | Admitting: Rheumatology

## 2017-06-08 DIAGNOSIS — M0579 Rheumatoid arthritis with rheumatoid factor of multiple sites without organ or systems involvement: Secondary | ICD-10-CM

## 2017-06-09 NOTE — Telephone Encounter (Signed)
Last Visit: 04/21/17 Next Visit: 07/26/17 Labs: 04/13/17 stable PLQ Eye Exam 09/2015  Left message to advise patient we need an update PLQ eye exam. Unable to refill medication until received.

## 2017-06-10 DIAGNOSIS — M05731 Rheumatoid arthritis with rheumatoid factor of right wrist without organ or systems involvement: Secondary | ICD-10-CM | POA: Diagnosis not present

## 2017-06-10 DIAGNOSIS — M05732 Rheumatoid arthritis with rheumatoid factor of left wrist without organ or systems involvement: Secondary | ICD-10-CM | POA: Diagnosis not present

## 2017-06-29 DIAGNOSIS — M05732 Rheumatoid arthritis with rheumatoid factor of left wrist without organ or systems involvement: Secondary | ICD-10-CM | POA: Diagnosis not present

## 2017-06-29 DIAGNOSIS — M05731 Rheumatoid arthritis with rheumatoid factor of right wrist without organ or systems involvement: Secondary | ICD-10-CM | POA: Diagnosis not present

## 2017-06-29 DIAGNOSIS — M25531 Pain in right wrist: Secondary | ICD-10-CM | POA: Diagnosis not present

## 2017-06-29 DIAGNOSIS — M25631 Stiffness of right wrist, not elsewhere classified: Secondary | ICD-10-CM | POA: Diagnosis not present

## 2017-07-05 ENCOUNTER — Telehealth: Payer: Self-pay | Admitting: Rheumatology

## 2017-07-05 NOTE — Telephone Encounter (Signed)
Last visit: 04/21/2017 Next visit: 07/26/2017 Labs: 04/13/2017 stable  Eye exam: left message to advise patient we need an updated eye exam.

## 2017-07-05 NOTE — Telephone Encounter (Signed)
Pharmacy calling in reference to Plaquenil refill request. Please call to advise. [phone] Fax# [phone]

## 2017-07-06 ENCOUNTER — Other Ambulatory Visit: Payer: Self-pay | Admitting: Rheumatology

## 2017-07-06 DIAGNOSIS — M0579 Rheumatoid arthritis with rheumatoid factor of multiple sites without organ or systems involvement: Secondary | ICD-10-CM

## 2017-07-07 NOTE — Telephone Encounter (Signed)
Last visit: 04/21/2017 Next visit: 07/26/2017 Labs: 04/13/2017 stable  PLQ Eye Exam: 09/2015  Okay to refill 30 day supply per Dr. Estanislado Pandy

## 2017-07-07 NOTE — Telephone Encounter (Signed)
Spoke with patient's granddaughter. She states she will get an appointment for her grandmother to have her PLQ eye exam. Will send in 30 day supply until PLQ eye exam is received.

## 2017-07-12 NOTE — Progress Notes (Deleted)
Office Visit Note  Patient: Kristy Oliver             Date of Birth: Aug 22, 1945           MRN: [id number]             PCP: Charlott Rakes, MD Referring: Charlott Rakes, MD Visit Date: 07/26/2017 Occupation: @GUAROCC @    Subjective:  No chief complaint on file.   History of Present Illness: Kristy Oliver is a 72 y.o. female ***   Activities of Daily Living:  Patient reports morning stiffness for *** {minute/hour:19697}.   Patient {ACTIONS;DENIES/REPORTS:21021675::"Denies"} nocturnal pain.  Difficulty dressing/grooming: {ACTIONS;DENIES/REPORTS:21021675::"Denies"} Difficulty climbing stairs: {ACTIONS;DENIES/REPORTS:21021675::"Denies"} Difficulty getting out of chair: {ACTIONS;DENIES/REPORTS:21021675::"Denies"} Difficulty using hands for taps, buttons, cutlery, and/or writing: {ACTIONS;DENIES/REPORTS:21021675::"Denies"}   No Rheumatology ROS completed.   PMFS History:  Patient Active Problem List   Diagnosis Date Noted  . Hx of adenomatous colonic polyps 12/29/2016  . Helicobacter pylori gastritis 12/13/2016  . History of asthma 07/16/2016  . History of depression 07/16/2016  . Compression fracture of body of thoracic vertebra ([place]) 07/16/2016  . Osteoporosis 07/07/2016  . 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 Moderate asthma without complication 28/78/6767  . High risk medication use 05/08/2016  . History of left shoulder replacement 05/08/2016  . Back pain 02/24/2016  . Tinea pedis 09/23/2015  . Depression 09/23/2015  . Hyperlipidemia 07/04/2015  . Chronic sinusitis 06/27/2015  . HTN (hypertension) 10/12/2013  . Smoker 10/12/2013  . Bloating symptom 10/12/2013  . RA (rheumatoid arthritis) ([place]) 06/30/2013  . Tobacco use 06/30/2013  . Poor dentition 11/17/2012    Past Medical History:  Diagnosis Date  . Allergy   . Arthritis   . Asthma    as young person, none currently  . Facial cellulitis 11/17/2012  . Gout   . Helicobacter pylori  gastritis 12/13/2016  . HTN (hypertension), malignant 11/17/2012  . Hx of adenomatous colonic polyps 12/29/2016  . Hyperlipidemia    medicine currently  . Hypertension   . Osteoporosis   . Renal disorder   . Tuberculosis    pt took meds for 9 months - finish meds last week     Family History  Problem Relation Age of Onset  . Colon cancer Neg Hx   . Esophageal cancer Neg Hx   . Rectal cancer Neg Hx   . Stomach cancer Neg Hx    Past Surgical History:  Procedure Laterality Date  . BREAST LUMPECTOMY    . c-section x 2    . COLONOSCOPY    . JOINT REPLACEMENT     left knee & left shoulder  . left breast cyst removal    . UPPER GASTROINTESTINAL ENDOSCOPY     Social History   Social History Narrative  . Not on file     Objective: Vital Signs: There were no vitals taken for this visit.   Physical Exam   Musculoskeletal Exam: ***  CDAI Exam: No CDAI exam completed.    Investigation: No additional findings.PLQ eye exam: 09/2015 CBC Latest Ref Rng & Units 04/13/2017 11/02/2016 05/11/2016  WBC 3.4 - 10.8 x10E3/uL 9.8 5.8 13.3(H)  Hemoglobin 11.1 - 15.9 g/dL 14.5 14.6 13.1  Hematocrit 34.0 - 46.6 % 41.6 40.6 40.1  Platelets 150 - 379 x10E3/uL 284 231 406(H)   CMP Latest Ref Rng & Units 04/13/2017 04/13/2017 11/02/2016  Glucose 65 - 99 mg/dL - 88 97  BUN 8 - 27 mg/dL - 9 6(L)  Creatinine 0.57 - 1.00 mg/dL -  0.84 0.69  Sodium 134 - 144 mmol/L - 140 142  Potassium 3.5 - 5.2 mmol/L - 4.6 3.9  Chloride 96 - 106 mmol/L - 108(H) 107(H)  CO2 20 - 29 mmol/L - 17(L) 20  Calcium 8.7 - 10.3 mg/dL - 9.2 9.4  Total Protein 6.0 - 8.5 g/dL 9.2(H) 9.2(H) -  Total Bilirubin 0.0 - 1.2 mg/dL - 0.4 -  Alkaline Phos 39 - 117 IU/L - 223(H) -  AST 0 - 40 IU/L - 31 -  ALT 0 - 32 IU/L - 11 -    Imaging: No results found.  Speciality Comments: No specialty comments available.    Procedures:  No procedures performed Allergies: Clindamycin; Iodinated diagnostic agents; Metrizamide;  Motrin [ibuprofen]; Milk-related compounds; and Shrimp [shellfish allergy]   Assessment / Plan:     Visit Diagnoses: No diagnosis found.    Orders: No orders of the defined types were placed in this encounter.  No orders of the defined types were placed in this encounter.   Face-to-face time spent with patient was *** minutes. 50% of time was spent in counseling and coordination of care.  Follow-Up Instructions: No Follow-up on file.   Earnestine Mealing, CMA  Note - This record has been created using Editor, commissioning.  Chart creation errors have been sought, but may not always  have been located. Such creation errors do not reflect on  the standard of medical care.

## 2017-07-13 DIAGNOSIS — M25631 Stiffness of right wrist, not elsewhere classified: Secondary | ICD-10-CM | POA: Diagnosis not present

## 2017-07-13 DIAGNOSIS — M25531 Pain in right wrist: Secondary | ICD-10-CM | POA: Diagnosis not present

## 2017-07-13 DIAGNOSIS — M05732 Rheumatoid arthritis with rheumatoid factor of left wrist without organ or systems involvement: Secondary | ICD-10-CM | POA: Diagnosis not present

## 2017-07-13 DIAGNOSIS — M05731 Rheumatoid arthritis with rheumatoid factor of right wrist without organ or systems involvement: Secondary | ICD-10-CM | POA: Diagnosis not present

## 2017-07-14 ENCOUNTER — Ambulatory Visit: Payer: Self-pay | Admitting: Family Medicine

## 2017-07-19 DIAGNOSIS — M79641 Pain in right hand: Secondary | ICD-10-CM | POA: Diagnosis not present

## 2017-07-26 ENCOUNTER — Ambulatory Visit: Payer: Medicare Other | Admitting: Rheumatology

## 2017-07-28 NOTE — Progress Notes (Signed)
Office Visit Note  Patient: Kristy Oliver             Date of Birth: [birth year]           MRN: [id number]             PCP: Charlott Rakes, MD Referring: Charlott Rakes, MD Visit Date: 08/03/2017 Occupation: @GUAROCC @    Subjective:  Pain in multiple joints    History of Present Illness: Kristy Oliver is a 72 y.o. female  Patient was treated with 4 months of rifampin by [facility].  She patient has been taking she is no longer taking prednisone.  Patient states she never started methotrexate.  She would still like to start methotrexate.  She is having pain in her right elbow, left shoulder, and left knee.  She states she has considerable joint stiffness.  She denies any joint swelling.  She states that she has nodules on her bilateral knees that are tender and wake her up at night.    Activities of Daily Living:  Patient reports morning stiffness for 2-2.5  hours.   Patient Reports nocturnal pain.  Difficulty dressing/grooming: Denies Difficulty climbing stairs: Reports Difficulty getting out of chair: Reports Difficulty using hands for taps, buttons, cutlery, and/or writing: Reports   Review of Systems  Constitutional: Negative for fatigue and weakness.  HENT: Positive for mouth sores and mouth dryness. Negative for trouble swallowing, trouble swallowing and nose dryness.   Eyes: Positive for redness. Negative for pain, visual disturbance and dryness.  Respiratory: Positive for cough. Negative for hemoptysis, shortness of breath and difficulty breathing.   Cardiovascular: Negative for chest pain, palpitations, hypertension and swelling in legs/feet.  Gastrointestinal: Negative for blood in stool, constipation and diarrhea.  Endocrine: Negative for increased urination.  Genitourinary: Negative for painful urination.  Musculoskeletal: Positive for arthralgias, joint pain, joint swelling and morning stiffness. Negative for myalgias, muscle weakness,  muscle tenderness and myalgias.  Skin: Positive for rash and nodules/bumps. Negative for color change, pallor, hair loss, redness, skin tightness, ulcers and sensitivity to sunlight.  Allergic/Immunologic: Negative for susceptible to infections.  Neurological: Negative for dizziness, numbness and headaches.  Hematological: Negative for swollen glands.  Psychiatric/Behavioral: Positive for depressed mood. Negative for sleep disturbance. The patient is not nervous/anxious.     PMFS History:  Patient Active Problem List   Diagnosis Date Noted  . Hx of adenomatous colonic polyps 12/29/2016  . Helicobacter pylori gastritis 12/13/2016  . History of asthma 07/16/2016  . History of depression 07/16/2016  . Compression fracture of body of thoracic vertebra ([place]) 07/16/2016  . Osteoporosis 07/07/2016  . 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 Moderate asthma without complication 29/93/7169  . High risk medication use 05/08/2016  . History of left shoulder replacement 05/08/2016  . Back pain 02/24/2016  . Tinea pedis 09/23/2015  . Depression 09/23/2015  . Hyperlipidemia 07/04/2015  . Chronic sinusitis 06/27/2015  . HTN (hypertension) 10/12/2013  . Smoker 10/12/2013  . Bloating symptom 10/12/2013  . RA (rheumatoid arthritis) ([place]) 06/30/2013  . Tobacco use 06/30/2013  . Poor dentition 11/17/2012    Past Medical History:  Diagnosis Date  . Allergy   . Arthritis   . Asthma    as young person, none currently  . Facial cellulitis 11/17/2012  . Gout   . Helicobacter pylori gastritis 12/13/2016  . HTN (hypertension), malignant 11/17/2012  . Hx of adenomatous colonic polyps 12/29/2016  . Hyperlipidemia    medicine currently  . Hypertension   .  Osteoporosis   . Renal disorder   . Tuberculosis    pt took meds for 9 months - finish meds last week     Family History  Problem Relation Age of Onset  . Colon cancer Neg Hx   . Esophageal cancer Neg Hx   . Rectal cancer Neg Hx   .  Stomach cancer Neg Hx    Past Surgical History:  Procedure Laterality Date  . BREAST LUMPECTOMY    . c-section x 2    . COLONOSCOPY    . JOINT REPLACEMENT     left knee & left shoulder  . left breast cyst removal    . UPPER GASTROINTESTINAL ENDOSCOPY    . WRIST SURGERY  2018   x2   Social History   Social History Narrative  . Not on file     Objective: Vital Signs: BP (!) 152/90 (BP Location: Left Arm, Patient Position: Sitting, Cuff Size: Normal)   Pulse (!) 58   Resp 14   Ht 5' (1.524 m)   Wt 119 lb (54 kg)   BMI 23.24 kg/m    Physical Exam  Constitutional: She is oriented to person, place, and time. She appears well-developed and well-nourished.  HENT:  Head: Normocephalic and atraumatic.  Eyes: Conjunctivae and EOM are normal.  Neck: Normal range of motion.  Cardiovascular: Normal rate, regular rhythm, normal heart sounds and intact distal pulses.  Pulmonary/Chest: Effort normal and breath sounds normal.  Abdominal: Soft. Bowel sounds are normal.  Lymphadenopathy:    She has no cervical adenopathy.  Neurological: She is alert and oriented to person, place, and time.  Skin: Skin is warm and dry. Capillary refill takes less than 2 seconds.  Psychiatric: She has a normal mood and affect. Her behavior is normal.  Nursing note and vitals reviewed.    Musculoskeletal Exam: C-spine limited range of motion.  She has severe thoracic kyphosis.  She has midline spinal tenderness in the thoracic region. Shoulder joints limited forward flexion to 90 degrees and limited internal rotation.  Rheumatoid nodules on bilateral elbows.  She has mild bilateral elbow contractures.  She has tenderness of her right elbow. Very limited ROM of bilateral wrists.  She has tenosynovitis of bilateral wrists.  She has several rheumatoid nodules present on hands. Right knee warmth. She has rheumatoid nodules on bilateral lower extremities and bilateral knees.   CDAI Exam: CDAI Homunculus Exam:     Swelling:  RUE: wrist LUE: wrist  Joint Counts:  CDAI Tender Joint count: 0 CDAI Swollen Joint count: 2  Global Assessments:  Patient Global Assessment: 7   CDAI Calculated Score: 9    Investigation: No additional findings. CBC Latest Ref Rng & Units 04/13/2017 11/02/2016 05/11/2016  WBC 3.4 - 10.8 x10E3/uL 9.8 5.8 13.3(H)  Hemoglobin 11.1 - 15.9 g/dL 14.5 14.6 13.1  Hematocrit 34.0 - 46.6 % 41.6 40.6 40.1  Platelets 150 - 379 x10E3/uL 284 231 406(H)   CMP Latest Ref Rng & Units 04/13/2017 04/13/2017 11/02/2016  Glucose 65 - 99 mg/dL - 88 97  BUN 8 - 27 mg/dL - 9 6(L)  Creatinine 0.57 - 1.00 mg/dL - 0.84 0.69  Sodium 134 - 144 mmol/L - 140 142  Potassium 3.5 - 5.2 mmol/L - 4.6 3.9  Chloride 96 - 106 mmol/L - 108(H) 107(H)  CO2 20 - 29 mmol/L - 17(L) 20  Calcium 8.7 - 10.3 mg/dL - 9.2 9.4  Total Protein 6.0 - 8.5 g/dL 9.2(H) 9.2(H) -  Total  Bilirubin 0.0 - 1.2 mg/dL - 0.4 -  Alkaline Phos 39 - 117 IU/L - 223(H) -  AST 0 - 40 IU/L - 31 -  ALT 0 - 32 IU/L - 11 -    Imaging: No results found.  Speciality Comments: No specialty comments available.    Procedures:  No procedures performed Allergies: Clindamycin; Iodinated diagnostic agents; Metrizamide; Motrin [ibuprofen]; Milk-related compounds; and Shrimp [shellfish allergy]   Assessment / Plan:     Visit Diagnoses: Rheumatoid arthritis with rheumatoid factor of multiple sites without organ or systems involvement ([place]) - Severe end-stage rheumatoid arthritis with mulitple contractures and nodulosis: She will continue on Plaquenil 200 mg twice daily.  She also will be started on methotrexate 4 tablets weekly x2 weeks.  She will return in 2 weeks for lab monitoring. If her labs are stable we will increase to MTX 6 tablets weekly x2 weeks.  If labs are stable after 2 weeks, we will increase her dose of MTX to 8 tablets weekly and folic acid 2 mg daily.  Consent for MTX was obtained at her last visit.  Prescription for  MTX and folic acid was sent to the pharmacy.   High risk medication use - MTX 4 tablets weekly x2 weeks, increase to 6 tablets if labs are stable in 2 weeks, then increase to 8 tablets if labs are stable. CBC and CMP were drawn today.  She will return in 2 weeks for labs to monitor for drug toxicity.   Age-related osteoporosis without current pathological fracture - w/ multiple thoracic vertebral fractures. She was evaluated by neurosurgeon and was not a surgical candidate.  She is no longer wearing a brace due to not being able to tolerate it. She is on Fosamax  Other secondary kyphosis, thoracic region: She is no longer wearing a brace.  She continues to have thoracic midline spinal tenderness.    History of left shoulder replacement: She has very limited ROM of bilateral shoulders.  She has discomfort with forward flexion and internal rotation.   Other medical conditions are listed as follows:   History of asthma  History of depression  Positive 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: She was treated with Rifampin.   Tobacco use    Orders: Orders Placed This Encounter  Procedures  . CBC with Differential/Platelet  . COMPLETE METABOLIC PANEL WITH GFR   Meds ordered this encounter  Medications  . methotrexate (RHEUMATREX) 2.5 MG tablet    Sig: Take 4 tablets by mouth once a week. Increase to 6 tablets once per week if labs are stable. Caution:Chemotherapy. Protect from light.    Dispense:  20 tablet    Refill:  0  . folic acid (FOLVITE) 1 MG tablet    Sig: Take 2 tablets by mouth daily.    Dispense:  180 tablet    Refill:  3   Face-to-face time spent with patient was 30 minutes. >50% of time was spent in counseling and coordination of care.  Follow-Up Instructions: No Follow-up on file.   Kristy Neas, PA-C   I examined and evaluated the patient with Kristy Sams PA. The plan of care was discussed as noted above.  Bo Merino, MD  Note - This record has been created using  Editor, commissioning.  Chart creation errors have been sought, but may not always  have been located. Such creation errors do not reflect on  the standard of medical care.

## 2017-08-02 ENCOUNTER — Ambulatory Visit: Payer: Medicare Other | Admitting: Physician Assistant

## 2017-08-02 DIAGNOSIS — H5203 Hypermetropia, bilateral: Secondary | ICD-10-CM | POA: Diagnosis not present

## 2017-08-02 DIAGNOSIS — Z79899 Other long term (current) drug therapy: Secondary | ICD-10-CM | POA: Diagnosis not present

## 2017-08-02 DIAGNOSIS — Z9229 Personal history of other drug therapy: Secondary | ICD-10-CM | POA: Diagnosis not present

## 2017-08-02 DIAGNOSIS — H52203 Unspecified astigmatism, bilateral: Secondary | ICD-10-CM | POA: Diagnosis not present

## 2017-08-02 DIAGNOSIS — H25013 Cortical age-related cataract, bilateral: Secondary | ICD-10-CM | POA: Diagnosis not present

## 2017-08-02 DIAGNOSIS — M069 Rheumatoid arthritis, unspecified: Secondary | ICD-10-CM | POA: Diagnosis not present

## 2017-08-02 DIAGNOSIS — H2513 Age-related nuclear cataract, bilateral: Secondary | ICD-10-CM | POA: Diagnosis not present

## 2017-08-02 DIAGNOSIS — H524 Presbyopia: Secondary | ICD-10-CM | POA: Diagnosis not present

## 2017-08-03 ENCOUNTER — Encounter: Payer: Self-pay | Admitting: Physician Assistant

## 2017-08-03 ENCOUNTER — Ambulatory Visit (INDEPENDENT_AMBULATORY_CARE_PROVIDER_SITE_OTHER): Payer: Medicare Other | Admitting: Rheumatology

## 2017-08-03 VITALS — BP 152/90 | HR 58 | Resp 14 | Ht 60.0 in | Wt 119.0 lb

## 2017-08-03 DIAGNOSIS — M0579 Rheumatoid arthritis with rheumatoid factor of multiple sites without organ or systems involvement: Secondary | ICD-10-CM | POA: Diagnosis not present

## 2017-08-03 DIAGNOSIS — Z8659 Personal history of other mental and behavioral disorders: Secondary | ICD-10-CM

## 2017-08-03 DIAGNOSIS — Z720 Tobacco use: Secondary | ICD-10-CM

## 2017-08-03 DIAGNOSIS — Z79899 Other long term (current) drug therapy: Secondary | ICD-10-CM | POA: Diagnosis not present

## 2017-08-03 DIAGNOSIS — M4014 Other secondary kyphosis, thoracic region: Secondary | ICD-10-CM | POA: Diagnosis not present

## 2017-08-03 DIAGNOSIS — Z96612 Presence of left artificial shoulder joint: Secondary | ICD-10-CM

## 2017-08-03 DIAGNOSIS — Z8709 Personal history of other diseases of the respiratory system: Secondary | ICD-10-CM | POA: Diagnosis not present

## 2017-08-03 DIAGNOSIS — R7612 Nonspecific reaction to cell mediated immunity measurement of gamma interferon antigen response without active tuberculosis: Secondary | ICD-10-CM

## 2017-08-03 DIAGNOSIS — M810 Age-related osteoporosis without current pathological fracture: Secondary | ICD-10-CM | POA: Diagnosis not present

## 2017-08-03 MED ORDER — FOLIC ACID 1 MG PO TABS: ORAL_TABLET | ORAL | 3 refills | Status: DC

## 2017-08-03 MED ORDER — METHOTREXATE 2.5 MG PO TABS: ORAL_TABLET | ORAL | 0 refills | Status: DC

## 2017-08-03 NOTE — Patient Instructions (Addendum)
Standing Labs We placed an order today for your standing lab work.    Please come back and get your standing labs in 2 weeks after starting methotrexate, then 4 weeks after starting methotrexate.   We have open lab Monday through Friday from 8:30-11:30 AM and 1:30-4 PM at the office of Dr. Bo Merino.   The office is located at [address] [place], [place], [place] [address] No appointment is necessary.   Labs are drawn by [facility].  You may receive a bill from [facility] for your lab work. If you have any questions regarding directions or hours of operation,  please call [phone].

## 2017-08-04 LAB — COMPLETE METABOLIC PANEL WITH GFR
AG RATIO: 0.6 (calc) — AB (ref 1.0–2.5)
ALT: 32 U/L — AB (ref 6–29)
AST: 45 U/L — AB (ref 10–35)
Albumin: 3.5 g/dL — ABNORMAL LOW (ref 3.6–5.1)
Alkaline phosphatase (APISO): 316 U/L — ABNORMAL HIGH (ref 33–130)
BILIRUBIN TOTAL: 0.5 mg/dL (ref 0.2–1.2)
BUN: 10 mg/dL (ref 7–25)
CALCIUM: 9.3 mg/dL (ref 8.6–10.4)
CO2: 22 mmol/L (ref 20–32)
Chloride: 110 mmol/L (ref 98–110)
Creat: 0.68 mg/dL (ref 0.50–0.99)
GFR, EST NON AFRICAN AMERICAN: 89 mL/min/{1.73_m2} (ref >=60)
GFR, Est African American: 103 mL/min/{1.73_m2} (ref >=60)
Globulin: 5.6 g/dL (calc) — ABNORMAL HIGH (ref 1.9–3.7)
Glucose, Bld: 86 mg/dL (ref 65–99)
POTASSIUM: 4.2 mmol/L (ref 3.5–5.3)
SODIUM: 139 mmol/L (ref 135–146)
Total Protein: 9.1 g/dL — ABNORMAL HIGH (ref 6.1–8.1)

## 2017-08-04 LAB — CBC WITH DIFFERENTIAL/PLATELET
BASOS ABS: 87 {cells}/uL (ref 0–200)
Basophils Relative: 0.9 %
EOS ABS: 223 {cells}/uL (ref 15–500)
EOS PCT: 2.3 %
HCT: 47.4 % — ABNORMAL HIGH (ref 35.0–45.0)
HEMOGLOBIN: 16.2 g/dL — AB (ref 11.7–15.5)
Lymphs Abs: 1659 cells/uL (ref 850–3900)
MCH: 30.6 pg (ref 27.0–33.0)
MCHC: 34.2 g/dL (ref 32.0–36.0)
MCV: 89.4 fL (ref 80.0–100.0)
MONOS PCT: 3.6 %
MPV: 10.5 fL (ref 7.5–12.5)
NEUTROS ABS: 7382 {cells}/uL (ref 1500–7800)
Neutrophils Relative %: 76.1 %
PLATELETS: 315 10*3/uL (ref 140–400)
RBC: 5.3 10*6/uL — ABNORMAL HIGH (ref 3.80–5.10)
RDW: 13 % (ref 11.0–15.0)
TOTAL LYMPHOCYTE: 17.1 %
WBC mixed population: 349 cells/uL (ref 200–950)
WBC: 9.7 10*3/uL (ref 3.8–10.8)

## 2017-08-04 NOTE — Progress Notes (Signed)
LFTs elevated.  Please advise her to not start MTX. Please advise her to take PLQ 1 tablet daily and we start her on Prednisone 5 mg po daily.  Please send prescription for prednisone.  Her return visit is 08/25/17 we will recheck labs and discuss treatment options at this visit.

## 2017-08-20 ENCOUNTER — Telehealth: Payer: Self-pay | Admitting: Family Medicine

## 2017-08-20 ENCOUNTER — Other Ambulatory Visit: Payer: Self-pay

## 2017-08-20 ENCOUNTER — Encounter: Payer: Self-pay | Admitting: Family Medicine

## 2017-08-20 ENCOUNTER — Ambulatory Visit: Payer: Medicare Other | Attending: Family Medicine | Admitting: Family Medicine

## 2017-08-20 ENCOUNTER — Telehealth: Payer: Self-pay | Admitting: *Deleted

## 2017-08-20 VITALS — BP 145/84 | HR 60 | Temp 97.8°F | Ht 60.0 in | Wt 119.2 lb

## 2017-08-20 DIAGNOSIS — J329 Chronic sinusitis, unspecified: Secondary | ICD-10-CM | POA: Insufficient documentation

## 2017-08-20 DIAGNOSIS — E7800 Pure hypercholesterolemia, unspecified: Secondary | ICD-10-CM | POA: Diagnosis not present

## 2017-08-20 DIAGNOSIS — G8929 Other chronic pain: Secondary | ICD-10-CM | POA: Insufficient documentation

## 2017-08-20 DIAGNOSIS — M818 Other osteoporosis without current pathological fracture: Secondary | ICD-10-CM | POA: Diagnosis not present

## 2017-08-20 DIAGNOSIS — Z79899 Other long term (current) drug therapy: Secondary | ICD-10-CM | POA: Insufficient documentation

## 2017-08-20 DIAGNOSIS — M0579 Rheumatoid arthritis with rheumatoid factor of multiple sites without organ or systems involvement: Secondary | ICD-10-CM | POA: Insufficient documentation

## 2017-08-20 DIAGNOSIS — J328 Other chronic sinusitis: Secondary | ICD-10-CM

## 2017-08-20 DIAGNOSIS — B351 Tinea unguium: Secondary | ICD-10-CM | POA: Diagnosis not present

## 2017-08-20 DIAGNOSIS — J45909 Unspecified asthma, uncomplicated: Secondary | ICD-10-CM | POA: Diagnosis not present

## 2017-08-20 DIAGNOSIS — R7989 Other specified abnormal findings of blood chemistry: Secondary | ICD-10-CM | POA: Diagnosis not present

## 2017-08-20 DIAGNOSIS — E785 Hyperlipidemia, unspecified: Secondary | ICD-10-CM | POA: Insufficient documentation

## 2017-08-20 DIAGNOSIS — M109 Gout, unspecified: Secondary | ICD-10-CM | POA: Insufficient documentation

## 2017-08-20 DIAGNOSIS — I10 Essential (primary) hypertension: Secondary | ICD-10-CM | POA: Diagnosis not present

## 2017-08-20 DIAGNOSIS — M810 Age-related osteoporosis without current pathological fracture: Secondary | ICD-10-CM | POA: Diagnosis not present

## 2017-08-20 DIAGNOSIS — M546 Pain in thoracic spine: Secondary | ICD-10-CM

## 2017-08-20 LAB — CBC WITH DIFFERENTIAL/PLATELET
BASOS PCT: 1 %
Basophils Absolute: 77 cells/uL (ref 0–200)
EOS PCT: 3.5 %
Eosinophils Absolute: 270 cells/uL (ref 15–500)
HCT: 40.2 % (ref 35.0–45.0)
Hemoglobin: 13.8 g/dL (ref 11.7–15.5)
Lymphs Abs: 1948 cells/uL (ref 850–3900)
MCH: 30.6 pg (ref 27.0–33.0)
MCHC: 34.3 g/dL (ref 32.0–36.0)
MCV: 89.1 fL (ref 80.0–100.0)
MPV: 11.2 fL (ref 7.5–12.5)
Monocytes Relative: 6.8 %
NEUTROS PCT: 63.4 %
Neutro Abs: 4882 cells/uL (ref 1500–7800)
PLATELETS: 317 10*3/uL (ref 140–400)
RBC: 4.51 10*6/uL (ref 3.80–5.10)
RDW: 13 % (ref 11.0–15.0)
TOTAL LYMPHOCYTE: 25.3 %
WBC mixed population: 524 cells/uL (ref 200–950)
WBC: 7.7 10*3/uL (ref 3.8–10.8)

## 2017-08-20 LAB — COMPLETE METABOLIC PANEL WITH GFR
AG Ratio: 0.6 (calc) — ABNORMAL LOW (ref 1.0–2.5)
ALBUMIN MSPROF: 3.4 g/dL — AB (ref 3.6–5.1)
ALKALINE PHOSPHATASE (APISO): 306 U/L — AB (ref 33–130)
ALT: 28 U/L (ref 6–29)
AST: 39 U/L — AB (ref 10–35)
BILIRUBIN TOTAL: 0.6 mg/dL (ref 0.2–1.2)
BUN: 10 mg/dL (ref 7–25)
CHLORIDE: 106 mmol/L (ref 98–110)
CO2: 26 mmol/L (ref 20–32)
Calcium: 9.5 mg/dL (ref 8.6–10.4)
Creat: 0.68 mg/dL (ref 0.50–0.99)
GFR, Est African American: 103 mL/min/{1.73_m2} (ref >=60)
GFR, Est Non African American: 89 mL/min/{1.73_m2} (ref >=60)
GLUCOSE: 74 mg/dL (ref 65–99)
Globulin: 5.5 g/dL (calc) — ABNORMAL HIGH (ref 1.9–3.7)
Potassium: 4.5 mmol/L (ref 3.5–5.3)
Sodium: 137 mmol/L (ref 135–146)
Total Protein: 8.9 g/dL — ABNORMAL HIGH (ref 6.1–8.1)

## 2017-08-20 MED ORDER — FLUTICASONE PROPIONATE 50 MCG/ACT NA SUSP: 2.0000 | Freq: Every day | NASAL | 6 refills | Status: DC

## 2017-08-20 MED ORDER — LIDOCAINE 5 % EX PTCH: 1.0000 | MEDICATED_PATCH | CUTANEOUS | 0 refills | Status: DC

## 2017-08-20 MED ORDER — PREDNISONE 5 MG PO TABS: 5.0000 mg | ORAL_TABLET | Freq: Every day | ORAL | 0 refills | Status: DC

## 2017-08-20 MED ORDER — TIZANIDINE HCL 4 MG PO TABS: ORAL_TABLET | ORAL | 5 refills | Status: DC

## 2017-08-20 MED ORDER — AMLODIPINE BESYLATE 10 MG PO TABS: 10.0000 mg | ORAL_TABLET | Freq: Every day | ORAL | 5 refills | Status: DC

## 2017-08-20 MED ORDER — CARVEDILOL 6.25 MG PO TABS: 6.2500 mg | ORAL_TABLET | Freq: Two times a day (BID) | ORAL | 5 refills | Status: DC

## 2017-08-20 NOTE — Progress Notes (Signed)
Office Visit Note  Patient: Kristy Oliver             Date of Birth: 07-04-1945           MRN: [id number]             PCP: Charlott Rakes, MD Referring: Charlott Rakes, MD Visit Date: 08/25/2017 Occupation: @GUAROCC @    Subjective:  Medication Management   History of Present Illness: Kristy Oliver is a 72 y.o. female with history of severe end-stage rheumatoid arthritis.  She has been on Plaquenil 200 mg 2 tablets p.o. daily.  She was given methotrexate but it was discontinued due to elevation of LFTs.  She states she is doing overall quite well except for some left knee joint pain.  She denies any joint swelling.  She has been taking Fosamax for osteoporosis.  She has had left shoulder replacement.  Activities of Daily Living:  Patient reports morning stiffness for 10 minutes.   Patient Reports nocturnal pain.  Lower back pain Difficulty dressing/grooming: Reports Difficulty climbing stairs: Reports Difficulty getting out of chair: Reports Difficulty using hands for taps, buttons, cutlery, and/or writing: Reports   No Rheumatology ROS completed.   PMFS History:  Patient Active Problem List   Diagnosis Date Noted  . Hx of adenomatous colonic polyps 12/29/2016  . Helicobacter pylori gastritis 12/13/2016  . History of asthma 07/16/2016  . History of depression 07/16/2016  . Compression fracture of body of thoracic vertebra ([place]) 07/16/2016  . Osteoporosis 07/07/2016  . 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 Moderate asthma without complication 40/98/1191  . High risk medication use 05/08/2016  . History of left shoulder replacement 05/08/2016  . Back pain 02/24/2016  . Tinea pedis 09/23/2015  . Depression 09/23/2015  . Hyperlipidemia 07/04/2015  . Chronic sinusitis 06/27/2015  . HTN (hypertension) 10/12/2013  . Smoker 10/12/2013  . Bloating symptom 10/12/2013  . RA (rheumatoid arthritis) ([place]) 06/30/2013  . Tobacco use 06/30/2013  . Poor dentition  11/17/2012    Past Medical History:  Diagnosis Date  . Allergy   . Arthritis   . Asthma    as young person, none currently  . Facial cellulitis 11/17/2012  . Gout   . Helicobacter pylori gastritis 12/13/2016  . HTN (hypertension), malignant 11/17/2012  . Hx of adenomatous colonic polyps 12/29/2016  . Hyperlipidemia    medicine currently  . Hypertension   . Osteoporosis   . Renal disorder   . Tuberculosis    pt took meds for 9 months - finish meds last week     Family History  Problem Relation Age of Onset  . Colon cancer Neg Hx   . Esophageal cancer Neg Hx   . Rectal cancer Neg Hx   . Stomach cancer Neg Hx    Past Surgical History:  Procedure Laterality Date  . BREAST LUMPECTOMY    . c-section x 2    . COLONOSCOPY    . JOINT REPLACEMENT     left knee & left shoulder  . left breast cyst removal    . UPPER GASTROINTESTINAL ENDOSCOPY    . WRIST SURGERY  2018   x2   Social History   Social History Narrative  . Not on file     Objective: Vital Signs: BP 139/89 (BP Location: Left Arm, Patient Position: Sitting, Cuff Size: Normal)   Pulse 74   Resp 15   Ht 5\' 2"  (1.575 m)   Wt 118 lb (53.5 kg)   BMI 21.58  kg/m    Physical Exam  Constitutional: She is oriented to person, place, and time. She appears well-developed and well-nourished.  HENT:  Head: Normocephalic and atraumatic.  Eyes: Conjunctivae and EOM are normal.  Neck: Normal range of motion.  Cardiovascular: Normal rate, regular rhythm, normal heart sounds and intact distal pulses.  Pulmonary/Chest: Effort normal and breath sounds normal.  Abdominal: Soft. Bowel sounds are normal.  Lymphadenopathy:    She has no cervical adenopathy.  Neurological: She is alert and oriented to person, place, and time.  Skin: Skin is warm and dry. Capillary refill takes less than 2 seconds.  Psychiatric: She has a normal mood and affect. Her behavior is normal.  Nursing note and vitals reviewed.    Musculoskeletal Exam:  C-spine limited range of motion.  She has severe thoracic kyphosis.  Left shoulder joint abduction was limited to 90 degrees.  She has limited range of motion of bilateral wrist joints with synovitis.  She is postsurgical changes in her right wrist joint with some suture present.  She has synovial thickening and nodulosis over bilateral MCP joints.  She has PIP and DIP thickening.  Hip joint range of motion was difficult to assess in the sitting position.  She has discomfort range of motion of her knee joints without any warmth swelling or effusion.  She has chronic changes in her ankle joints MTPs and PIPs due to rheumatoid arthritis.  CDAI Exam: CDAI Homunculus Exam:   Tenderness:  RUE: wrist LUE: glenohumeral and wrist Right hand: 2nd MCP and 3rd MCP Left hand: 2nd MCP and 3rd MCP LLE: tibiofemoral and tibiotalar  Swelling:  RUE: wrist LUE: wrist  Joint Counts:  CDAI Tender Joint count: 8 CDAI Swollen Joint count: 2  Global Assessments:  Patient Global Assessment: 5 Provider Global Assessment: 5  CDAI Calculated Score: 20    Investigation: No additional findings. CBC Latest Ref Rng & Units 08/20/2017 08/03/2017 04/13/2017  WBC 3.8 - 10.8 Thousand/uL 7.7 9.7 9.8  Hemoglobin 11.7 - 15.5 g/dL 13.8 16.2(H) 14.5  Hematocrit 35.0 - 45.0 % 40.2 47.4(H) 41.6  Platelets 140 - 400 Thousand/uL 317 315 284   CMP Latest Ref Rng & Units 08/20/2017 08/03/2017 04/13/2017  Glucose 65 - 99 mg/dL 74 86 -  BUN 7 - 25 mg/dL 10 10 -  Creatinine 0.50 - 0.99 mg/dL 0.68 0.68 -  Sodium 135 - 146 mmol/L 137 139 -  Potassium 3.5 - 5.3 mmol/L 4.5 4.2 -  Chloride 98 - 110 mmol/L 106 110 -  CO2 20 - 32 mmol/L 26 22 -  Calcium 8.6 - 10.4 mg/dL 9.5 9.3 -  Total Protein 6.1 - 8.1 g/dL 8.9(H) 9.1(H) 9.2(H)  Total Bilirubin 0.2 - 1.2 mg/dL 0.6 0.5 -  Alkaline Phos 39 - 117 IU/L - - -  AST 10 - 35 U/L 39(H) 45(H) -  ALT 6 - 29 U/L 28 32(H) -    Imaging: No results found.  Speciality Comments: PLQ  Eye Exam: 08/02/17 WNL follow up in 1 year    Procedures:  No procedures performed Allergies: Clindamycin; Iodinated diagnostic agents; Metrizamide; Motrin [ibuprofen]; Milk-related compounds; and Shrimp [shellfish allergy]   Assessment / Plan:     Visit Diagnoses: Rheumatoid arthritis involving multiple sites with positive rheumatoid factor ([facility]) - Severe end-stage rheumatoid arthritis with mulitple contractures and nodulosis: She continues to have some synovitis of her bilateral wrist joints and some synovial thickening.  She tried methotrexate but could not take it due to elevation of  LFTs.  We had detailed discussion regarding using [employer].  Patient declined.  She wants to continue on Plaquenil 400 mg a day which was prescribed by Dr. Charlestine Night several years ago.   High risk medication use - PLQ 400 mg p.o. daily, methotrexate discontinued due to elevation of LFTs.PLQ eye exam: August 02, 2017 was normal.  Age-related osteoporosis without current pathological fracture: She has severe kyphosis she also is taking Fosamax.  Her DEXA is followed by her PCP.  Other secondary kyphosis, thoracic region  History of left shoulder replacement: She has limited range of motion.  Positive 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 - She was treated with Rifampin.  History of asthma  History of depression  Tobacco use: Smoking cessation discussed.   Orders: No orders of the defined types were placed in this encounter.  No orders of the defined types were placed in this encounter.   Face-to-face time spent with patient was 25 minutes.  Greater than 50% of time was spent in counseling and coordination of care.  Follow-Up Instructions: Return in about 5 months (around 01/25/2018) for Rheumatoid arthritis, Osteoporosis.   Bo Merino, MD  Note - This record has been created using Editor, commissioning.  Chart creation errors have been sought, but may not always  have been located. Such creation errors do not  reflect on  the standard of medical care.

## 2017-08-20 NOTE — Progress Notes (Signed)
Subjective:  Patient ID: Kristy Oliver, female    DOB: [birth year]  Age: 72 y.o. MRN: [id number]  CC: Hypertension   HPI Kristy Oliver is a 72 year old female with a history of rheumatoid arthritis, osteoporosis, hypertension, hyperlipidemia, history of treatment for latent TB, who comes in for a follow-up visit today accompanied by a family member and her great grandkids. She complains of thoracic back pain which is chronic and not relieved by her back brace and so she no longer uses it.  Was seen by neurosurgery and deemed not to be a surgical candidate.  Unable to identify relieving or aggravating factors but knows that current medication regimen is not helping. Her rheumatologist is Dr. Estanislado Pandy however she was seen by the rheumatology PA 2 weeks ago At which time she was started on methotrexate to be taken with Plaquenil however due to LFTs trending up she was called by the clinic to discontinue methotrexate and continue Plaquenil and prednisone.  She complains of overlapping of toes of her right foot which she has noticed over the last 3 months but denies pain.  With regards to her hypertension she endorses compliance with her antihypertensives but does not have her medications with her. Tolerating her statin and denies myalgias. Doing well on Fosamax and denies reflux symptoms. Appetite is not great but she declines initiation of medications to improve her appetite as she states she likes her weight as is.  Past Medical History:  Diagnosis Date  . Allergy   . Arthritis   . Asthma    as young person, none currently  . Facial cellulitis 11/17/2012  . Gout   . Helicobacter pylori gastritis 12/13/2016  . HTN (hypertension), malignant 11/17/2012  . Hx of adenomatous colonic polyps 12/29/2016  . Hyperlipidemia    medicine currently  . Hypertension   . Osteoporosis   . Renal disorder   . Tuberculosis    pt took meds for 9 months - finish meds last week     Past Surgical  History:  Procedure Laterality Date  . BREAST LUMPECTOMY    . c-section x 2    . COLONOSCOPY    . JOINT REPLACEMENT     left knee & left shoulder  . left breast cyst removal    . UPPER GASTROINTESTINAL ENDOSCOPY    . WRIST SURGERY  2018   x2    Family History  Problem Relation Age of Onset  . Colon cancer Neg Hx   . Esophageal cancer Neg Hx   . Rectal cancer Neg Hx   . Stomach cancer Neg Hx     Outpatient Medications Prior to Visit  Medication Sig Dispense Refill  . alendronate (FOSAMAX) 70 MG tablet Take 1 tablet (70 mg total) by mouth once a week. Take with a full glass of water on an empty stomach. 4 tablet 6  . cetirizine (ZYRTEC) 10 MG tablet Take 1 tablet (10 mg total) by mouth daily. 30 tablet 5  . CONSTULOSE 10 GM/15ML solution TAKE 15 MLS BY MOUTH THREE TIMES A DAY 946 mL 0  . dronabinol (MARINOL) 2.5 MG capsule Take 1 capsule (2.5 mg total) by mouth 2 (two) times daily before lunch and supper. 60 capsule 3  . folic acid (FOLVITE) 1 MG tablet Take 2 tablets by mouth daily. 782 tablet 3  . folic acid (FOLVITE) 423 MCG tablet Take 1 tablet (400 mcg total) by mouth daily. 30 tablet 5  . hydroxychloroquine (PLAQUENIL) 200 MG tablet Take 400 mg by  mouth.    . hydroxychloroquine (PLAQUENIL) 200 MG tablet TAKE 1 TABLET BY MOUTH EVERY DAY 30 tablet 0  . lisinopril (PRINIVIL,ZESTRIL) 20 MG tablet Take by mouth.    . methotrexate (RHEUMATREX) 2.5 MG tablet Take 4 tablets by mouth once a week. Increase to 6 tablets once per week if labs are stable. Caution:Chemotherapy. Protect from light. 20 tablet 0  . metroNIDAZOLE (FLAGYL) 500 MG tablet Take by mouth.    . pravastatin (PRAVACHOL) 20 MG tablet Take 1 tablet (20 mg total) by mouth daily. 30 tablet 5  . predniSONE (DELTASONE) 5 MG tablet Take 1 tablet (5 mg total) by mouth daily with breakfast. 30 tablet 0  . rifampin (RIFADIN) 150 MG capsule Take 300 mg by mouth daily.    Marland Kitchen sulfamethoxazole-trimethoprim (BACTRIM DS,SEPTRA DS)  800-160 MG tablet Take by mouth.    . traMADol (ULTRAM) 50 MG tablet Take 1 tablet (50 mg total) by mouth every 12 (twelve) hours as needed. 60 tablet 2  . amLODipine (NORVASC) 10 MG tablet Take 1 tablet (10 mg total) by mouth daily. 30 tablet 5  . carvedilol (COREG) 6.25 MG tablet Take 1 tablet (6.25 mg total) by mouth 2 (two) times daily with a meal. 60 tablet 5  . colchicine 0.6 MG tablet Take 1 tablet (0.6 mg total) by mouth daily. 30 tablet 3  . lidocaine (XYLOCAINE) 2 % solution USE AS DIRECTED 15MLS IN THE MOUTH OR THROAT AS NEEDED FOR MOUTH PAIN 100 mL 0  . tiZANidine (ZANAFLEX) 4 MG tablet TAKE 1 TABLET BY MOUTH EVERY 12 HOURS AS NEEDED FOR MUSCLE SPASMS 60 tablet 5  . clotrimazole (LOTRIMIN) 1 % cream APPLY TO AFFECTED AREA ON SKIN TWICE A DAY (Patient not taking: Reported on 04/13/2017) 30 g 1  . terbinafine (LAMISIL AT) 1 % cream Apply 1 application topically 2 (two) times daily. (Patient not taking: Reported on 08/03/2017) 30 g 2  . fluticasone (FLONASE) 50 MCG/ACT nasal spray Place 2 sprays into both nostrils daily. (Patient not taking: Reported on 08/03/2017) 16 g 6  . omeprazole (PRILOSEC) 20 MG capsule Take 1 capsule (20 mg total) by mouth 2 (two) times daily. (Patient not taking: Reported on 12/24/2016) 28 capsule 0   Facility-Administered Medications Prior to Visit  Medication Dose Route Frequency Provider Last Rate Last Dose  . 0.9 %  sodium chloride infusion  500 mL Intravenous Continuous Gatha Mayer, MD      . 0.9 %  sodium chloride infusion  500 mL Intravenous Continuous Gatha Mayer, MD        ROS Review of Systems  Constitutional: Negative for activity change, appetite change and fatigue.  HENT: Negative for congestion, sinus pressure and sore throat.   Eyes: Negative for visual disturbance.  Respiratory: Negative for cough, chest tightness, shortness of breath and wheezing.   Cardiovascular: Negative for chest pain and palpitations.  Gastrointestinal: Negative  for abdominal distention, abdominal pain and constipation.  Endocrine: Negative for polydipsia.  Genitourinary: Negative for dysuria and frequency.  Musculoskeletal: Positive for back pain. Negative for arthralgias.  Skin: Negative for rash.  Neurological: Negative for tremors, light-headedness and numbness.  Hematological: Does not bruise/bleed easily.  Psychiatric/Behavioral: Negative for agitation and behavioral problems.    Objective:  BP (!) 145/84   Pulse 60   Temp 97.8 F (36.6 C) (Oral)   Ht 5' (1.524 m)   Wt 119 lb 3.2 oz (54.1 kg)   SpO2 98%   BMI 23.28 kg/m  BP/Weight 08/20/2017 08/03/2017 06/02/3233  Systolic BP 573 220 254  Diastolic BP 84 90 77  Wt. (Lbs) 119.2 119 116  BMI 23.28 23.24 23.43      Physical Exam  Constitutional: She is oriented to person, place, and time. She appears well-developed and well-nourished.  Cardiovascular: Normal rate, normal heart sounds and intact distal pulses.  No murmur heard. Pulmonary/Chest: Effort normal and breath sounds normal. She has no wheezes. She has no rales. She exhibits no tenderness.  Abdominal: Soft. Bowel sounds are normal. She exhibits no distension and no mass. There is no tenderness.  Musculoskeletal: She exhibits edema (multiple nodules in elbows and knees) and tenderness (TTP of thoracic spine).  Limited ROM of joints Kyphosis of thoracic spine Right foot with 3rd toe overlapping 2nd  Neurological: She is alert and oriented to person, place, and time.  Skin: Skin is warm and dry.  Long dystrophic toenails  Psychiatric:  Flat affect    CMP Latest Ref Rng & Units 08/03/2017 04/13/2017 04/13/2017  Glucose 65 - 99 mg/dL 86 - 88  BUN 7 - 25 mg/dL 10 - 9  Creatinine 0.50 - 0.99 mg/dL 0.68 - 0.84  Sodium 135 - 146 mmol/L 139 - 140  Potassium 3.5 - 5.3 mmol/L 4.2 - 4.6  Chloride 98 - 110 mmol/L 110 - 108(H)  CO2 20 - 32 mmol/L 22 - 17(L)  Calcium 8.6 - 10.4 mg/dL 9.3 - 9.2  Total Protein 6.1 - 8.1 g/dL  9.1(H) 9.2(H) 9.2(H)  Total Bilirubin 0.2 - 1.2 mg/dL 0.5 - 0.4  Alkaline Phos 39 - 117 IU/L - - 223(H)  AST 10 - 35 U/L 45(H) - 31  ALT 6 - 29 U/L 32(H) - 11    Lipid Panel     Component Value Date/Time   CHOL 250 (H) 07/02/2015 0915   TRIG 102 07/02/2015 0915   HDL 96 07/02/2015 0915   CHOLHDL 2.6 07/02/2015 0915   VLDL 20 07/02/2015 0915   LDLCALC 134 (H) 07/02/2015 0915     Assessment & Plan:   1. Chronic midline thoracic back pain Uncontrolled Unrelieved by using back brace Not a surgical candidate as per neurosurgery Lidoderm patch added to regimen - tiZANidine (ZANAFLEX) 4 MG tablet; TAKE 1 TABLET BY MOUTH EVERY 12 HOURS AS NEEDED FOR MUSCLE SPASMS  Dispense: 60 tablet; Refill: 5 - lidocaine (LIDODERM) 5 %; Place 1 patch onto the skin daily. Remove & Discard patch within 12 hours or as directed by MD  Dispense: 30 patch; Refill: 0  2. Pure hypercholesterolemia Uncontrolled Hold off on pravastatin due to elevated LFTs  3. Other osteoporosis without current pathological fracture Currently on Fosamax  4. Essential hypertension Slightly elevated No regimen change Lisinopril appears on her med list however she is not here with her medications and is unsure if she takes this Advised to bring in all meds to her next appointment - amLODipine (NORVASC) 10 MG tablet; Take 1 tablet (10 mg total) by mouth daily.  Dispense: 30 tablet; Refill: 5 - carvedilol (COREG) 6.25 MG tablet; Take 1 tablet (6.25 mg total) by mouth 2 (two) times daily with a meal.  Dispense: 60 tablet; Refill: 5  5. Other chronic sinusitis - fluticasone (FLONASE) 50 MCG/ACT nasal spray; Place 2 sprays into both nostrils daily.  Dispense: 16 g; Refill: 6  6. Onychomycosis - Ambulatory referral to Podiatry  7. Rheumatoid arthritis involving multiple sites with positive rheumatoid factor (HCC) End-stage rheumatoid arthritis with nodules as per rheumatology Continue Plaquenil,  prednisone Keep follow-up  appointment with rheumatology - Dr Estanislado Pandy - Ambulatory referral to Podiatry   Meds ordered this encounter  Medications  . tiZANidine (ZANAFLEX) 4 MG tablet    Sig: TAKE 1 TABLET BY MOUTH EVERY 12 HOURS AS NEEDED FOR MUSCLE SPASMS    Dispense:  60 tablet    Refill:  5  . amLODipine (NORVASC) 10 MG tablet    Sig: Take 1 tablet (10 mg total) by mouth daily.    Dispense:  30 tablet    Refill:  5  . carvedilol (COREG) 6.25 MG tablet    Sig: Take 1 tablet (6.25 mg total) by mouth 2 (two) times daily with a meal.    Dispense:  60 tablet    Refill:  5    Discontinue previous dose  . fluticasone (FLONASE) 50 MCG/ACT nasal spray    Sig: Place 2 sprays into both nostrils daily.    Dispense:  16 g    Refill:  6  . lidocaine (LIDODERM) 5 %    Sig: Place 1 patch onto the skin daily. Remove & Discard patch within 12 hours or as directed by MD    Dispense:  30 patch    Refill:  0    Follow-up: Return in about 3 months (around 11/20/2017) for follow up of chronic medical conditions.   Charlott Rakes MD

## 2017-08-20 NOTE — Patient Instructions (Signed)
Rheumatoid Arthritis Rheumatoid arthritis (RA) is a long-term (chronic) disease. RA causes inflammation in your joints. Your joints may feel painful, stiff, swollen, warm, or tender. RA may start slowly. Usually, it affects the small joints of the hands and feet. It can also affect other parts of the body, even the heart, eyes, or lungs. Symptoms of RA often come and go. Sometimes, symptoms get worse for a while. These are called flares. There is no cure for RA, but your doctor will work with you to find the best treatment option for you. This will depend on how the disease is changing in your body. Follow these instructions at home:  Take over-the-counter and prescription medicines only as told by your doctor. Your doctor may change (adjust) your medicines every 3 months.  Start an exercise program as told by your doctor.  Rest when you have a flare.  Return to your normal activities as told by your doctor. Ask your doctor what activities are safe for you.  Keep all follow-up visits as told by your doctor. This is important. Contact a doctor if:  You have a flare.  You have a fever.  You have problems (side effects) because of your medicines. Get help right away if:  You have chest pain.  You have trouble breathing.  You have a hot, painful joint all of a sudden, and it is worse than your usual joint aches. This information is not intended to replace advice given to you by your health care provider. Make sure you discuss any questions you have with your health care provider. Document Released: 08/03/2011 Document Revised: 10/17/2015 Document Reviewed: 02/21/2015 Elsevier Interactive Patient Education  2018 [employer]  

## 2017-08-20 NOTE — Telephone Encounter (Signed)
Briefly met with patient and family member regarding SCAT and the application. Explained to them the process and informed that I would be faxing application to SCAT once completed. They understood. Gave application to patient and she had family member fill out Part A of application. Informed them both that once completed I would come back in to the room to fill out Part B. When I came back to check on patient, she and family member had left (the office).

## 2017-08-20 NOTE — Telephone Encounter (Signed)
-----   Message from Ofilia Neas, PA-C sent at 08/04/2017  5:42 PM EDT ----- LFTs elevated.  Please advise her to not start MTX. Please advise her to take PLQ 1 tablet daily and we start her on Prednisone 5 mg po daily.  Please send prescription for prednisone.  Her return visit is 08/25/17 we will recheck labs and discuss trea tment options at this visit.

## 2017-08-23 NOTE — Progress Notes (Signed)
AST improving.  ALT WNL.  Please advise her to hold off on restarting MTX. We will continue to monitor.  Alk phos is elevated.  Please schedule a total body bone scan.   CBC WNL.

## 2017-08-25 ENCOUNTER — Encounter: Payer: Self-pay | Admitting: Rheumatology

## 2017-08-25 ENCOUNTER — Ambulatory Visit (INDEPENDENT_AMBULATORY_CARE_PROVIDER_SITE_OTHER): Payer: Medicare Other | Admitting: Rheumatology

## 2017-08-25 VITALS — BP 139/89 | HR 74 | Resp 15 | Ht 62.0 in | Wt 118.0 lb

## 2017-08-25 DIAGNOSIS — Z79899 Other long term (current) drug therapy: Secondary | ICD-10-CM

## 2017-08-25 DIAGNOSIS — Z96612 Presence of left artificial shoulder joint: Secondary | ICD-10-CM | POA: Diagnosis not present

## 2017-08-25 DIAGNOSIS — Z8709 Personal history of other diseases of the respiratory system: Secondary | ICD-10-CM | POA: Diagnosis not present

## 2017-08-25 DIAGNOSIS — M4014 Other secondary kyphosis, thoracic region: Secondary | ICD-10-CM | POA: Diagnosis not present

## 2017-08-25 DIAGNOSIS — M0579 Rheumatoid arthritis with rheumatoid factor of multiple sites without organ or systems involvement: Secondary | ICD-10-CM | POA: Diagnosis not present

## 2017-08-25 DIAGNOSIS — M810 Age-related osteoporosis without current pathological fracture: Secondary | ICD-10-CM

## 2017-08-25 DIAGNOSIS — Z8659 Personal history of other mental and behavioral disorders: Secondary | ICD-10-CM | POA: Diagnosis not present

## 2017-08-25 DIAGNOSIS — Z720 Tobacco use: Secondary | ICD-10-CM

## 2017-08-25 DIAGNOSIS — R7612 Nonspecific reaction to cell mediated immunity measurement of gamma interferon antigen response without active tuberculosis: Secondary | ICD-10-CM | POA: Diagnosis not present

## 2017-08-26 DIAGNOSIS — Z4789 Encounter for other orthopedic aftercare: Secondary | ICD-10-CM | POA: Diagnosis not present

## 2017-09-01 ENCOUNTER — Telehealth: Payer: Self-pay | Admitting: *Deleted

## 2017-09-01 DIAGNOSIS — R748 Abnormal levels of other serum enzymes: Secondary | ICD-10-CM

## 2017-09-01 NOTE — Telephone Encounter (Signed)
-----  Message from Ofilia Neas, PA-C sent at 08/23/2017  2:07 PM EDT ----- AST improving.  ALT WNL.  Please advise her to hold off on restarting MTX. We will continue to monitor.  Alk phos is elevated.  Please schedule a total body bone scan.   CBC WNL.

## 2017-09-06 ENCOUNTER — Ambulatory Visit: Payer: Medicare Other | Admitting: Podiatry

## 2017-09-16 ENCOUNTER — Ambulatory Visit (HOSPITAL_COMMUNITY): Payer: Medicare Other

## 2017-09-16 ENCOUNTER — Other Ambulatory Visit (HOSPITAL_COMMUNITY): Payer: Self-pay

## 2017-09-24 ENCOUNTER — Encounter (HOSPITAL_COMMUNITY)
Admission: RE | Admit: 2017-09-24 | Discharge: 2017-09-24 | Disposition: A | Discharge status: Home / Self Care | Payer: Medicare Other | Source: Ambulatory Visit | Attending: Rheumatology | Admitting: Rheumatology

## 2017-09-24 ENCOUNTER — Ambulatory Visit (HOSPITAL_COMMUNITY)
Admission: RE | Admit: 2017-09-24 | Discharge: 2017-09-24 | Disposition: A | Discharge status: Home / Self Care | Payer: Medicare Other | Source: Ambulatory Visit | Attending: Rheumatology | Admitting: Rheumatology

## 2017-09-24 DIAGNOSIS — R748 Abnormal levels of other serum enzymes: Secondary | ICD-10-CM | POA: Diagnosis not present

## 2017-09-24 MED ORDER — TECHNETIUM TC 99M MEDRONATE IV KIT
19.7000 | PACK | Freq: Once | INTRAVENOUS | Status: AC | PRN
  Administered 2017-09-24: 19.7 via INTRAVENOUS

## 2017-09-28 ENCOUNTER — Other Ambulatory Visit: Payer: Self-pay | Admitting: Family Medicine

## 2017-09-28 DIAGNOSIS — M546 Pain in thoracic spine: Secondary | ICD-10-CM

## 2017-09-28 DIAGNOSIS — G8929 Other chronic pain: Secondary | ICD-10-CM

## 2017-09-28 DIAGNOSIS — I10 Essential (primary) hypertension: Secondary | ICD-10-CM

## 2017-09-28 NOTE — Progress Notes (Signed)
Plesae notify Pt and sch CT left proximal humerus with and without contrast

## 2017-09-30 ENCOUNTER — Encounter: Payer: Self-pay | Admitting: Podiatry

## 2017-09-30 ENCOUNTER — Ambulatory Visit (INDEPENDENT_AMBULATORY_CARE_PROVIDER_SITE_OTHER): Payer: Medicare Other

## 2017-09-30 ENCOUNTER — Ambulatory Visit (INDEPENDENT_AMBULATORY_CARE_PROVIDER_SITE_OTHER): Payer: Medicare Other | Admitting: Podiatry

## 2017-09-30 DIAGNOSIS — M79675 Pain in left toe(s): Secondary | ICD-10-CM | POA: Diagnosis not present

## 2017-09-30 DIAGNOSIS — M2040 Other hammer toe(s) (acquired), unspecified foot: Secondary | ICD-10-CM

## 2017-09-30 DIAGNOSIS — B351 Tinea unguium: Secondary | ICD-10-CM

## 2017-09-30 DIAGNOSIS — M79674 Pain in right toe(s): Secondary | ICD-10-CM | POA: Diagnosis not present

## 2017-09-30 NOTE — Progress Notes (Signed)
Subjective: 72 y.o. returns the office today for painful, elongated, thickened toenails which she cannot trim herself. Denies any redness or drainage around the nails.  She also states that the right second toe is curling underneath the third toe has been very painful is been hurting a lot more over the years.  At this point she has had a surgery to help with the second toe.  She is interested in having amputation performed of the toe as part of the toe given the deformity.  Denies any acute changes since last appointment and no new complaints today. Denies any systemic complaints such as fevers, chills, nausea, vomiting.   PCP: Charlott Rakes, MD  Objective: AAO 3, NAD DP/PT pulses palpable, CRT less than 3 seconds  Nails hypertrophic, dystrophic, elongated, brittle, discolored 10. There is tenderness There is deformity to the right second toe is currently the third toe.  Has quite a bit of lateral deformity present at the level the PIPJ.  There is tenderness palpation to the distal portion of the toe.  Overlying the nails 1-5 bilaterally. There is no surrounding erythema or drainage along the nail sites. No open lesions or pre-ulcerative lesions are identified. No other areas of tenderness bilateral lower extremities. No overlying edema, erythema, increased warmth. No pain with calf compression, swelling, warmth, erythema.  Assessment: Patient presents with symptomatic onychomycosis; right second toe deformity   Plan: -Treatment options including alternatives, risks, complications were discussed -Nails sharply debrided 10 without complication/bleeding. -In regards to the right second toe x-rays were obtained reviewed with her there is deformity present more the PIPJ.  We discussed surgery including amputation of the toe as well as trying to straighten the floor with a screw or plate fixation.  After discussion she wishes to go to proceed with a partial amputation of the toe.  We discussed  the surgery itself the postoperative course -The incision placement as well as the postoperative course was discussed with the patient. I discussed risks of the surgery which include, but not limited to, infection, bleeding, pain, swelling, need for further surgery, delayed or nonhealing, painful or ugly scar, numbness or sensation changes, over/under correction, recurrence, transfer lesions, further deformity, hardware failure, DVT/PE, loss of toe/foot. Patient understands these risks and wishes to proceed with surgery. The surgical consent was reviewed with the patient all 3 pages were signed. No promises or guarantees were given to the outcome of the procedure. All questions were answered to the best of my ability. Before the surgery the patient was encouraged to call the office if there is any further questions. The surgery will be performed at the [facility] on an outpatient basis.  Trula Slade DPM

## 2017-09-30 NOTE — Patient Instructions (Signed)

## 2017-10-04 ENCOUNTER — Telehealth: Payer: Self-pay | Admitting: *Deleted

## 2017-10-04 DIAGNOSIS — R937 Abnormal findings on diagnostic imaging of other parts of musculoskeletal system: Secondary | ICD-10-CM

## 2017-10-04 NOTE — Telephone Encounter (Signed)
-----   Message from Bo Merino, MD sent at 09/28/2017  1:03 PM EDT ----- Plesae notify Pt and sch CT left proximal humerus with and without contrast

## 2017-10-14 ENCOUNTER — Telehealth: Payer: Self-pay | Admitting: Rheumatology

## 2017-10-14 DIAGNOSIS — R937 Abnormal findings on diagnostic imaging of other parts of musculoskeletal system: Secondary | ICD-10-CM

## 2017-10-14 NOTE — Telephone Encounter (Signed)
Tedra Coupe from [facility] called back and stated that if the CT is with contrast they need new labs.  The last labs they can see in Epic were done on 3/29 and a  new lab order for I-Stat Creatinine is needed.  An order can be put in Epic and we can do the test at his appointment.  If the CT is without contrast then no new labs are needed.  [phone]

## 2017-10-14 NOTE — Telephone Encounter (Signed)
[facility] left a voicemail stating they received the referral for patient to receive CT Humerus with and without contrast.  They need it to be changed to either with contrast or without contrast.  [phone]

## 2017-10-15 NOTE — Telephone Encounter (Signed)
CT with contrast

## 2017-10-15 NOTE — Telephone Encounter (Signed)
Attempted to contact [phone] and unable to get answer.

## 2017-10-19 ENCOUNTER — Telehealth: Payer: Self-pay | Admitting: *Deleted

## 2017-10-19 ENCOUNTER — Ambulatory Visit (HOSPITAL_COMMUNITY): Admission: RE | Admit: 2017-10-19 | Payer: Medicare Other | Source: Ambulatory Visit

## 2017-10-19 NOTE — Telephone Encounter (Signed)
Attempted to call patient and left message on machine for patient to call the office.  

## 2017-10-19 NOTE — Telephone Encounter (Signed)
Tedra Coupe calling also to see if patient has had 13 hour prep due to being allergic to contrast. Also, checking on new order that has not been put in yet. Patient scheduled for 5pm today. Please call Tedra Coupe back to let her know either way.

## 2017-10-19 NOTE — Telephone Encounter (Signed)
Spoke to [place] at [place] and advised her that I could not get in touch with the patient to find out if she has done the 13 hour prep. Tedra Coupe states they will cancel the CT for today and notify patient. A new order has been placed for the CT with contrast.

## 2017-10-19 NOTE — Addendum Note (Signed)
Addended by: Earnestine Mealing on: 10/19/2017 01:01 PM   Modules accepted: Orders

## 2017-10-20 NOTE — Telephone Encounter (Signed)
"  Ms. Jakubowicz said that they decided not to do the surgery when she came in to see Dr. Jacqualyn Posey last week.  She said they discussed it with Dr. Jacqualyn Posey."  Faythe Ghee, I'll take her off the schedule.  I called Ms. Belfield to see if she wanted to cancel her surgery.  Her grand-daughter, Kalman Shan, answered.  She said her grandmother changed her mind about having surgery.

## 2017-10-28 ENCOUNTER — Ambulatory Visit (HOSPITAL_COMMUNITY): Payer: Medicare Other

## 2017-11-01 ENCOUNTER — Encounter: Payer: Medicare Other | Admitting: Podiatry

## 2017-11-02 ENCOUNTER — Other Ambulatory Visit: Payer: Self-pay | Admitting: Rheumatology

## 2017-11-02 DIAGNOSIS — M0579 Rheumatoid arthritis with rheumatoid factor of multiple sites without organ or systems involvement: Secondary | ICD-10-CM

## 2017-11-03 NOTE — Telephone Encounter (Signed)
Last Visit: 08/25/17 Next Visit: 01/26/18 Labs: 08/20/17 AST improving. ALT WNL. We will continue to monitor.  Alk phos is elevated. CBC WNL.  PLQ Eye Exam: 08/02/17 WNL  Okay to refill per Dr. Estanislado Pandy

## 2017-11-08 ENCOUNTER — Encounter: Payer: Medicare Other | Admitting: Podiatry

## 2017-11-09 ENCOUNTER — Ambulatory Visit: Payer: Self-pay | Admitting: Family Medicine

## 2017-11-26 ENCOUNTER — Ambulatory Visit (HOSPITAL_COMMUNITY): Admission: RE | Admit: 2017-11-26 | Payer: Medicare Other | Source: Ambulatory Visit

## 2017-12-07 ENCOUNTER — Other Ambulatory Visit: Payer: Self-pay | Admitting: Rheumatology

## 2017-12-07 DIAGNOSIS — M0579 Rheumatoid arthritis with rheumatoid factor of multiple sites without organ or systems involvement: Secondary | ICD-10-CM

## 2017-12-08 NOTE — Telephone Encounter (Signed)
Last Visit: 08/25/17 Next Visit: 01/26/18 Labs: 08/20/17 AST improving. ALT WNL. Alk phos is elevated. CBC WNL.  PLQ Eye Exam: 08/02/17 WNL   Okay to refill per Dr. Estanislado Pandy

## 2017-12-21 ENCOUNTER — Telehealth: Payer: Self-pay

## 2017-12-21 ENCOUNTER — Ambulatory Visit: Payer: Medicare Other | Admitting: Licensed Clinical Social Worker

## 2017-12-21 ENCOUNTER — Ambulatory Visit: Payer: Medicare Other | Attending: Family Medicine | Admitting: Family Medicine

## 2017-12-21 ENCOUNTER — Encounter: Payer: Self-pay | Admitting: Family Medicine

## 2017-12-21 VITALS — BP 104/70 | HR 54 | Temp 98.1°F | Ht 62.0 in | Wt 116.0 lb

## 2017-12-21 DIAGNOSIS — B86 Scabies: Secondary | ICD-10-CM

## 2017-12-21 DIAGNOSIS — F339 Major depressive disorder, recurrent, unspecified: Secondary | ICD-10-CM | POA: Insufficient documentation

## 2017-12-21 DIAGNOSIS — I10 Essential (primary) hypertension: Secondary | ICD-10-CM | POA: Insufficient documentation

## 2017-12-21 DIAGNOSIS — M546 Pain in thoracic spine: Secondary | ICD-10-CM

## 2017-12-21 DIAGNOSIS — F331 Major depressive disorder, recurrent, moderate: Secondary | ICD-10-CM

## 2017-12-21 DIAGNOSIS — Z886 Allergy status to analgesic agent status: Secondary | ICD-10-CM | POA: Diagnosis not present

## 2017-12-21 DIAGNOSIS — Z881 Allergy status to other antibiotic agents status: Secondary | ICD-10-CM | POA: Diagnosis not present

## 2017-12-21 DIAGNOSIS — Z79899 Other long term (current) drug therapy: Secondary | ICD-10-CM | POA: Diagnosis not present

## 2017-12-21 DIAGNOSIS — E785 Hyperlipidemia, unspecified: Secondary | ICD-10-CM | POA: Insufficient documentation

## 2017-12-21 DIAGNOSIS — M818 Other osteoporosis without current pathological fracture: Secondary | ICD-10-CM | POA: Diagnosis not present

## 2017-12-21 DIAGNOSIS — Z91013 Allergy to seafood: Secondary | ICD-10-CM | POA: Diagnosis not present

## 2017-12-21 DIAGNOSIS — M0579 Rheumatoid arthritis with rheumatoid factor of multiple sites without organ or systems involvement: Secondary | ICD-10-CM | POA: Insufficient documentation

## 2017-12-21 DIAGNOSIS — M810 Age-related osteoporosis without current pathological fracture: Secondary | ICD-10-CM | POA: Diagnosis not present

## 2017-12-21 DIAGNOSIS — G8929 Other chronic pain: Secondary | ICD-10-CM

## 2017-12-21 DIAGNOSIS — Z79891 Long term (current) use of opiate analgesic: Secondary | ICD-10-CM | POA: Diagnosis not present

## 2017-12-21 MED ORDER — PERMETHRIN 5 % EX CREA: 1.0000 "application " | TOPICAL_CREAM | Freq: Once | CUTANEOUS | 0 refills | Status: AC

## 2017-12-21 MED ORDER — MIRTAZAPINE 15 MG PO TABS: 15.0000 mg | ORAL_TABLET | Freq: Every day | ORAL | 3 refills | Status: DC

## 2017-12-21 MED ORDER — AMLODIPINE BESYLATE 10 MG PO TABS: 10.0000 mg | ORAL_TABLET | Freq: Every day | ORAL | 6 refills | Status: DC

## 2017-12-21 MED ORDER — TIZANIDINE HCL 4 MG PO TABS: 4.0000 mg | ORAL_TABLET | Freq: Three times a day (TID) | ORAL | 3 refills | Status: DC | PRN

## 2017-12-21 MED ORDER — CARVEDILOL 6.25 MG PO TABS: ORAL_TABLET | ORAL | 6 refills | Status: DC

## 2017-12-21 MED ORDER — ALENDRONATE SODIUM 70 MG PO TABS: 70.0000 mg | ORAL_TABLET | ORAL | 6 refills | Status: DC

## 2017-12-21 NOTE — Telephone Encounter (Signed)
Met with the patient and her daughter, Kristy Oliver , when she was in the clinic today and assisted them with completing a SCAT application.  The application was then faxed to SCAT eligibility. This CM provided Kristy Oliver with the phone # for SCAT and informed her that she can contact SCAT to check on the status of the referral. If more information is needed, instructed her to contact this CM.   Also reminded her that the patient has medicaid and is eligible for medicaid transportation to medical appointments. Kristy Oliver stated that she thinks the patient has been registered for those rides but she prefers that family take her to appointments because the patient may not remember all that was discussed during the appointment. She was not interested in [place] transportation at this time.   The patient's is currently receiving aide services - Mon-Fri from 1000-1300.

## 2017-12-21 NOTE — BH Specialist Note (Signed)
Integrated Behavioral Health Initial Visit  MRN: [id number] Name: Lajuana Patchell  Number of [place] Clinician visits:: 1/6 Session Start time: 10:20 AM  Session End time: 10:35 AM Total time: 15 minutes  Type of Service: [place] Interpretor:No. Interpretor Name and Language: N/A   Warm Hand Off Completed.       SUBJECTIVE: Preslyn Warr is a 72 y.o. female accompanied by Adult Daughter and minor grandchild Patient was referred by Dr. Margarita Rana for depression. Patient reports the following symptoms/concerns: feelings of sadness and low motivation Duration of problem: awhile; Severity of problem: moderate  OBJECTIVE: Mood: Dysphoric and Affect: Appropriate Risk of harm to self or others: No plan to harm self or others  LIFE CONTEXT: Family and Social: Pt receives support from family, who she resides with School/Work: Pt receives Fish farm manager Self-Care: Pt smokes cigarettes (5 a day) She shared that she has decreased use from 1 ppd Life Changes: Pt has ongoing medical conditions  GOALS ADDRESSED: Patient will: 1. Reduce symptoms of: anxiety and depression 2. Increase knowledge and/or ability of: coping skills  3. Demonstrate ability to: Increase healthy adjustment to current life circumstances and Increase adequate support systems for patient/family  INTERVENTIONS: Interventions utilized: Veterinary surgeon, Supportive Counseling, Psychoeducation and/or Health Education and Link to [employer]  Standardized Assessments completed: Patient declined screening  ASSESSMENT: Patient currently experiencing depression and anxiety triggered by ongoing medical conditions. She reports feelings of sadness and decreased motivation. Pt reports having support from family. Denies SI/HI/AVH.   Patient may benefit from psychoeducation and psychotherapy. LCSWA educated pt on the correlation between one's physical and  mental health, in addition, to how stress can negatively impact health. Therapeutic interventions were discussed to decrease symptoms. She is currently participating in medication management through PCP. LCSWA provided pt with information on local [place] to strengthen support system. RN Case Manager, Eden Lathe, assisted family in completing a SCAT application and informing them of additional transportation resources.   PLAN: 1. Follow up with behavioral health clinician on : Pt was encouraged to contact LCSWA if symptoms worsen or fail to improve to schedule behavioral appointments at [facility]. 2. Behavioral recommendations: LCSWA recommends that pt apply healthy coping skills discussed, comply with medication management, and utilize provided resources. Pt is encouraged to schedule follow up appointment with LCSWA 3. Referral(s): [place] (In Clinic) and [employer] Resources:  Transportation and [facility]  4. "From scale of 1-10, how likely are you to follow plan?":   Rebekah Chesterfield, LCSW 12/23/17 5:38 PM

## 2017-12-21 NOTE — Patient Instructions (Signed)
Scabies, Adult Scabies is a skin condition that happens when very small insects get under the skin (infestation). This causes a rash and severe itchiness. Scabies can spread from person to person (is contagious). If you get scabies, it is common for others in your household to get scabies too. With proper treatment, symptoms usually go away in 2-4 weeks. Scabies usually does not cause lasting problems. What are the causes? This condition is caused by mites (Sarcoptes scabiei, or human itch mites) that can only be seen with a microscope. The mites get into the top layer of skin and lay eggs. Scabies can spread from person to person through:  Close contact with a person who has scabies.  Contact with infested items, such as towels, bedding, or clothing.  What increases the risk? This condition is more likely to develop in:  People who live in nursing homes and other extended-care facilities.  People who have sexual contact with a partner who has scabies.  Young children who attend child care facilities.  People who care for others who are at increased risk for scabies.  What are the signs or symptoms? Symptoms of this condition may include:  Severe itchiness. This is often worse at night.  A rash that includes tiny red bumps or blisters. The rash commonly occurs on the wrist, elbow, armpit, fingers, waist, groin, or buttocks. Bumps may form a line (burrow) in some areas.  Skin irritation. This can include scaly patches or sores.  How is this diagnosed? This condition is diagnosed with a physical exam. Your health care provider will look closely at your skin. In some cases, your health care provider may take a sample of your affected skin (skin scraping) and have it examined under a microscope. How is this treated? This condition may be treated with:  Medicated cream or lotion that kills the mites. This is spread on the entire body and left on for several hours. Usually, one treatment  with medicated cream or lotion is enough to kill all of the mites. In severe cases, the treatment may be repeated.  Medicated cream that relieves itching.  Medicines that help to relieve itching.  Medicines that kill the mites. This treatment is rarely used.  Follow these instructions at home:  Medicines  Take or apply over-the-counter and prescription medicines as told by your health care provider.  Apply medicated cream or lotion as told by your health care provider.  Do not wash off the medicated cream or lotion until the necessary amount of time has passed. Skin Care  Avoid scratching your affected skin.  Keep your fingernails closely trimmed to reduce injury from scratching.  Take cool baths or apply cool washcloths to help reduce itching. General instructions  Clean all items that you recently had contact with, including bedding, clothing, and furniture. Do this on the same day that your treatment starts. ? Use hot water when you wash items. ? Place unwashable items into closed, airtight plastic bags for at least 3 days. The mites cannot live for more than 3 days away from human skin. ? Vacuum furniture and mattresses that you use.  Make sure that other people who may have been infested are examined by a health care provider. These include members of your household and anyone who may have had contact with infested items.  Keep all follow-up visits as told by your health care provider. This is important. Contact a health care provider if:  You have itching that does not go away   after 4 weeks of treatment.  You continue to develop new bumps or burrows.  You have redness, swelling, or pain in your rash area after treatment.  You have fluid, blood, or pus coming from your rash. This information is not intended to replace advice given to you by your health care provider. Make sure you discuss any questions you have with your health care provider. Document Released:  01/30/2015 Document Revised: 10/17/2015 Document Reviewed: 12/11/2014 Elsevier Interactive Patient Education  2018 Elsevier Inc.  

## 2017-12-22 ENCOUNTER — Encounter: Payer: Self-pay | Admitting: Family Medicine

## 2017-12-22 NOTE — Progress Notes (Signed)
Subjective:  Patient ID: [name], female    DOB: [birth year]  Age: 72 y.o. MRN: [id number]  CC: Hypertension   HPI Kristy Oliver  is a 72 year old female with a history of rheumatoid arthritis, osteoporosis, hypertension, hyperlipidemia, history of treatment for latent TB, who comes in for a follow-up visit today accompanied by a family member and her great grandkids. She remains on Plaquenil and prednisone for her rheumatoid arthritis and has been compliant with Fosamax which she takes for osteoporosis and is doing well on her statin and antihypertensives. She complains of breaking out in a rash in her left upper and left lower extremity which she attributes to commencing at the time she was placed on a new medication but I have reviewed her chart and do not see any new medication in place.  Methotrexate had been discontinued at her last rheumatology visit due to elevated LFTs. Rash is pruritic more so at night and no other family member has had a similar experience. She endorses having a little more than being depressed due to her medical conditions but denies suicidal ideations or intents.  She also endorses a decreased appetite and poor sleep.  Past Medical History:  Diagnosis Date  . Allergy   . Arthritis   . Asthma    as young person, none currently  . Facial cellulitis 11/17/2012  . Gout   . Helicobacter pylori gastritis 12/13/2016  . HTN (hypertension), malignant 11/17/2012  . Hx of adenomatous colonic polyps 12/29/2016  . Hyperlipidemia    medicine currently  . Hypertension   . Osteoporosis   . Renal disorder   . Tuberculosis    pt took meds for 9 months - finish meds last week     Past Surgical History:  Procedure Laterality Date  . BREAST LUMPECTOMY    . c-section x 2    . COLONOSCOPY    . JOINT REPLACEMENT     left knee & left shoulder  . left breast cyst removal    . UPPER GASTROINTESTINAL ENDOSCOPY    . WRIST SURGERY  2018   x2    Allergies    Allergen Reactions  . Clindamycin Hives, Rash and Swelling  . Iodinated Diagnostic Agents     unknown  . Metrizamide     unknown  . Motrin [Ibuprofen] Other (See Comments)    blackout States she is only allergic to 800 mg formulation  . Milk-Related Compounds Diarrhea  . Shrimp [Shellfish Allergy] Hives, Swelling and Rash     Outpatient Medications Prior to Visit  Medication Sig Dispense Refill  . cetirizine (ZYRTEC) 10 MG tablet Take 1 tablet (10 mg total) by mouth daily. 30 tablet 5  . fluticasone (FLONASE) 50 MCG/ACT nasal spray Place 2 sprays into both nostrils daily. 16 g 6  . folic acid (FOLVITE) 626 MCG tablet TAKE 1 TABLET BY MOUTH ONCE DAILY 30 tablet 4  . hydroxychloroquine (PLAQUENIL) 200 MG tablet TAKE 1 TABLET BY MOUTH EVERY DAY 30 tablet 0  . alendronate (FOSAMAX) 70 MG tablet Take 1 tablet (70 mg total) by mouth once a week. Take with a full glass of water on an empty stomach. 4 tablet 6  . amLODipine (NORVASC) 10 MG tablet TAKE 1 TABLET BY MOUTH ONCE DAILY 30 tablet 4  . carvedilol (COREG) 6.25 MG tablet TAKE 1 TABLET BY MOUTH 2 TIMES A DAY WITH A MEAL 60 tablet 4  . tiZANidine (ZANAFLEX) 4 MG tablet TAKE 1 TABLET BY MOUTH EVERY 12 HOURS  AS NEEDED FOR MUSCLE SPASMS 60 tablet 4  . clotrimazole (LOTRIMIN) 1 % cream APPLY TO AFFECTED AREA ON SKIN TWICE A DAY (Patient not taking: Reported on 04/13/2017) 30 g 1  . CONSTULOSE 10 GM/15ML solution TAKE 15 MLS BY MOUTH THREE TIMES A DAY (Patient not taking: Reported on 12/21/2017) 946 mL 0  . dronabinol (MARINOL) 2.5 MG capsule Take 1 capsule (2.5 mg total) by mouth 2 (two) times daily before lunch and supper. (Patient not taking: Reported on 12/21/2017) 60 capsule 3  . hydroxychloroquine (PLAQUENIL) 200 MG tablet Take 400 mg by mouth.    Marland Kitchen lisinopril (PRINIVIL,ZESTRIL) 20 MG tablet Take by mouth.    . methotrexate (RHEUMATREX) 2.5 MG tablet Take 4 tablets by mouth once a week. Increase to 6 tablets once per week if labs are  stable. Caution:Chemotherapy. Protect from light. (Patient not taking: Reported on 08/25/2017) 20 tablet 0  . metroNIDAZOLE (FLAGYL) 500 MG tablet Take by mouth.    . pravastatin (PRAVACHOL) 20 MG tablet Take 1 tablet (20 mg total) by mouth daily. (Patient not taking: Reported on 12/21/2017) 30 tablet 5  . predniSONE (DELTASONE) 5 MG tablet Take 1 tablet (5 mg total) by mouth daily with breakfast. (Patient not taking: Reported on 12/21/2017) 30 tablet 0  . rifampin (RIFADIN) 150 MG capsule Take 300 mg by mouth daily.    Marland Kitchen sulfamethoxazole-trimethoprim (BACTRIM DS,SEPTRA DS) 800-160 MG tablet Take by mouth.    . terbinafine (LAMISIL AT) 1 % cream Apply 1 application topically 2 (two) times daily. (Patient not taking: Reported on 08/03/2017) 30 g 2  . traMADol (ULTRAM) 50 MG tablet Take 1 tablet (50 mg total) by mouth every 12 (twelve) hours as needed. (Patient not taking: Reported on 12/21/2017) 60 tablet 2  . amLODipine (NORVASC) 10 MG tablet Take 1 tablet (10 mg total) by mouth daily. (Patient not taking: Reported on 12/21/2017) 30 tablet 5  . carvedilol (COREG) 6.25 MG tablet Take 1 tablet (6.25 mg total) by mouth 2 (two) times daily with a meal. (Patient not taking: Reported on 08/25/2017) 60 tablet 5  . folic acid (FOLVITE) 1 MG tablet Take 2 tablets by mouth daily. (Patient not taking: Reported on 12/21/2017) 180 tablet 3  . lidocaine (LIDODERM) 5 % Place 1 patch onto the skin daily. Remove & Discard patch within 12 hours or as directed by MD (Patient not taking: Reported on 12/21/2017) 30 patch 0  . tiZANidine (ZANAFLEX) 4 MG tablet TAKE 1 TABLET BY MOUTH EVERY 12 HOURS AS NEEDED FOR MUSCLE SPASMS (Patient not taking: Reported on 12/21/2017) 60 tablet 5   Facility-Administered Medications Prior to Visit  Medication Dose Route Frequency Provider Last Rate Last Dose  . 0.9 %  sodium chloride infusion  500 mL Intravenous Continuous Gatha Mayer, MD        ROS Review of Systems General: negative for  fever, weight loss, + appetite change Eyes: no visual symptoms. ENT: no ear symptoms, no sinus tenderness, no nasal congestion or sore throat. Neck: no pain  Respiratory: no wheezing, shortness of breath, cough Cardiovascular: no chest pain, no dyspnea on exertion, no pedal edema, no orthopnea. Gastrointestinal: no abdominal pain, no diarrhea, no constipation Genito-Urinary: no urinary frequency, no dysuria, no polyuria. Hematologic: no bruising Endocrine: no cold or heat intolerance Neurological: no headaches, no seizures, no tremors Musculoskeletal: + joint pains, +joint swelling Skin: + pruritus, +rash. Psychological: +depression, no suicidal ideation or intents   Objective:  BP 104/70   Pulse Marland Kitchen)  54   Temp 98.1 F (36.7 C) (Oral)   Ht 5\' 2"  (1.575 m)   Wt 116 lb (52.6 kg)   SpO2 97%   BMI 21.22 kg/m   BP/Weight 12/21/2017 08/25/2017 9/73/5329  Systolic BP 924 268 341  Diastolic BP 70 89 84  Wt. (Lbs) 116 118 119.2  BMI 21.22 21.58 23.28      Physical Exam  Constitutional: She is oriented to person, place, and time. She appears well-developed and well-nourished.  Cardiovascular: Normal rate, normal heart sounds and intact distal pulses.  No murmur heard. Pulmonary/Chest: Effort normal and breath sounds normal. She has no wheezes. She has no rales. She exhibits no tenderness.  Abdominal: Soft. Bowel sounds are normal. She exhibits no distension and no mass. There is no tenderness.  Musculoskeletal:  Multiple rheumatoid nodules in bilateral hands and legs  Neurological: She is alert and oriented to person, place, and time.  Skin:  Pinpoint rash with scabs and scratch marks on left upper and lower extremity  Psychiatric:  Dysphoric mood    CMP Latest Ref Rng & Units 08/20/2017 08/03/2017 04/13/2017  Glucose 65 - 99 mg/dL 74 86 -  BUN 7 - 25 mg/dL 10 10 -  Creatinine 0.50 - 0.99 mg/dL 0.68 0.68 -  Sodium 135 - 146 mmol/L 137 139 -  Potassium 3.5 - 5.3 mmol/L 4.5 4.2  -  Chloride 98 - 110 mmol/L 106 110 -  CO2 20 - 32 mmol/L 26 22 -  Calcium 8.6 - 10.4 mg/dL 9.5 9.3 -  Total Protein 6.1 - 8.1 g/dL 8.9(H) 9.1(H) 9.2(H)  Total Bilirubin 0.2 - 1.2 mg/dL 0.6 0.5 -  Alkaline Phos 39 - 117 IU/L - - -  AST 10 - 35 U/L 39(H) 45(H) -  ALT 6 - 29 U/L 28 32(H) -    CBC    Component Value Date/Time   WBC 7.7 08/20/2017 1526   RBC 4.51 08/20/2017 1526   HGB 13.8 08/20/2017 1526   HGB 14.5 04/13/2017 1720   HCT 40.2 08/20/2017 1526   HCT 41.6 04/13/2017 1720   PLT 317 08/20/2017 1526   PLT 284 04/13/2017 1720   MCV 89.1 08/20/2017 1526   MCV 89 04/13/2017 1720   MCH 30.6 08/20/2017 1526   MCHC 34.3 08/20/2017 1526   RDW 13.0 08/20/2017 1526   RDW 14.0 04/13/2017 1720   LYMPHSABS 1,948 08/20/2017 1526   LYMPHSABS 1.7 04/13/2017 1720   MONOABS 1,197 (H) 05/11/2016 0816   EOSABS 270 08/20/2017 1526   EOSABS 0.3 04/13/2017 1720   BASOSABS 77 08/20/2017 1526   BASOSABS 0.1 04/13/2017 1720     Assessment & Plan:   1. Rheumatoid arthritis involving multiple sites with positive rheumatoid factor (HCC) End-stage rheumatoid arthritis as per rheumatology Continue Plaquenil and prednisone Follow-up with rheumatology  2. Chronic midline thoracic back pain Stable Back brace as prescribed - tiZANidine (ZANAFLEX) 4 MG tablet; Take 1 tablet (4 mg total) by mouth every 8 (eight) hours as needed for muscle spasms.  Dispense: 60 tablet; Refill: 3  3. Essential hypertension Controlled Counseled on blood pressure goal of less than 130/80, low-sodium, DASH diet, medication compliance, 150 minutes of moderate intensity exercise per week. Discussed medication compliance, adverse effects. - carvedilol (COREG) 6.25 MG tablet; TAKE 1 TABLET BY MOUTH 2 TIMES A DAY WITH A MEAL  Dispense: 60 tablet; Refill: 6 - amLODipine (NORVASC) 10 MG tablet; Take 1 tablet (10 mg total) by mouth daily.  Dispense: 30 tablet; Refill: 6  4. Other osteoporosis without current  pathological fracture - alendronate (FOSAMAX) 70 MG tablet; Take 1 tablet (70 mg total) by mouth once a week. Take with a full glass of water on an empty stomach.  Dispense: 4 tablet; Refill: 6  5. Scabies Discussed care of beddings - permethrin (ELIMITE) 5 % cream; Apply 1 application topically once for 1 dose.  Dispense: 60 g; Refill: 0  6. Episode of recurrent major depressive disorder, unspecified depression episode severity ([place]) LCSW called in for counseling and she will need community resources for seniors so she can be involved in social events as her current medical conditions are debilitating Commenced on Remeron which will help with appetite, sleep and depression - mirtazapine (REMERON) 15 MG tablet; Take 1 tablet (15 mg total) by mouth at bedtime.  Dispense: 30 tablet; Refill: 3   Meds ordered this encounter  Medications  . tiZANidine (ZANAFLEX) 4 MG tablet    Sig: Take 1 tablet (4 mg total) by mouth every 8 (eight) hours as needed for muscle spasms.    Dispense:  60 tablet    Refill:  3  . mirtazapine (REMERON) 15 MG tablet    Sig: Take 1 tablet (15 mg total) by mouth at bedtime.    Dispense:  30 tablet    Refill:  3  . carvedilol (COREG) 6.25 MG tablet    Sig: TAKE 1 TABLET BY MOUTH 2 TIMES A DAY WITH A MEAL    Dispense:  60 tablet    Refill:  6  . amLODipine (NORVASC) 10 MG tablet    Sig: Take 1 tablet (10 mg total) by mouth daily.    Dispense:  30 tablet    Refill:  6  . alendronate (FOSAMAX) 70 MG tablet    Sig: Take 1 tablet (70 mg total) by mouth once a week. Take with a full glass of water on an empty stomach.    Dispense:  4 tablet    Refill:  6  . permethrin (ELIMITE) 5 % cream    Sig: Apply 1 application topically once for 1 dose.    Dispense:  60 g    Refill:  0    Follow-up: Return in about 3 months (around 03/23/2018) for Follow-up of chronic medical conditions.   Charlott Rakes MD

## 2018-01-12 NOTE — Progress Notes (Deleted)
Office Visit Note  Patient: Kristy Oliver             Date of Birth: [birth year]           MRN: [id number]             PCP: Charlott Rakes, MD Referring: Charlott Rakes, MD Visit Date: 01/26/2018 Occupation: @GUAROCC @  Subjective:  No chief complaint on file.   History of Present Illness: Kristy Oliver is a 72 y.o. female ***   Activities of Daily Living:  Patient reports morning stiffness for *** {minute/hour:19697}.   Patient {ACTIONS;DENIES/REPORTS:21021675::"Denies"} nocturnal pain.  Difficulty dressing/grooming: {ACTIONS;DENIES/REPORTS:21021675::"Denies"} Difficulty climbing stairs: {ACTIONS;DENIES/REPORTS:21021675::"Denies"} Difficulty getting out of chair: {ACTIONS;DENIES/REPORTS:21021675::"Denies"} Difficulty using hands for taps, buttons, cutlery, and/or writing: {ACTIONS;DENIES/REPORTS:21021675::"Denies"}  No Rheumatology ROS completed.   PMFS History:  Patient Active Problem List   Diagnosis Date Noted  . Hx of adenomatous colonic polyps 12/29/2016  . Helicobacter pylori gastritis 12/13/2016  . History of asthma 07/16/2016  . History of depression 07/16/2016  . Compression fracture of body of thoracic vertebra ([place]) 07/16/2016  . Osteoporosis 07/07/2016  . 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 Moderate asthma without complication 55/73/2202  . High risk medication use 05/08/2016  . History of left shoulder replacement 05/08/2016  . Back pain 02/24/2016  . Tinea pedis 09/23/2015  . Depression 09/23/2015  . Hyperlipidemia 07/04/2015  . Chronic sinusitis 06/27/2015  . HTN (hypertension) 10/12/2013  . Smoker 10/12/2013  . Bloating symptom 10/12/2013  . RA (rheumatoid arthritis) ([place]) 06/30/2013  . Tobacco use 06/30/2013  . Poor dentition 11/17/2012    Past Medical History:  Diagnosis Date  . Allergy   . Arthritis   . Asthma    as young person, none currently  . Facial cellulitis 11/17/2012  . Gout   . Helicobacter pylori gastritis  12/13/2016  . HTN (hypertension), malignant 11/17/2012  . Hx of adenomatous colonic polyps 12/29/2016  . Hyperlipidemia    medicine currently  . Hypertension   . Osteoporosis   . Renal disorder   . Tuberculosis    pt took meds for 9 months - finish meds last week     Family History  Problem Relation Age of Onset  . Colon cancer Neg Hx   . Esophageal cancer Neg Hx   . Rectal cancer Neg Hx   . Stomach cancer Neg Hx    Past Surgical History:  Procedure Laterality Date  . BREAST LUMPECTOMY    . c-section x 2    . COLONOSCOPY    . JOINT REPLACEMENT     left knee & left shoulder  . left breast cyst removal    . UPPER GASTROINTESTINAL ENDOSCOPY    . WRIST SURGERY  2018   x2   Social History   Social History Narrative  . Not on file    Objective: Vital Signs: There were no vitals taken for this visit.   Physical Exam   Musculoskeletal Exam: ***  CDAI Exam: CDAI Score: Not documented Patient Global Assessment: Not documented; Provider Global Assessment: Not documented Swollen: Not documented; Tender: Not documented Joint Exam   Not documented   There is currently no information documented on the homunculus. Go to the Rheumatology activity and complete the homunculus joint exam.  Investigation: No additional findings.  Imaging: No results found.  Recent Labs: Lab Results  Component Value Date   WBC 7.7 08/20/2017   HGB 13.8 08/20/2017   PLT 317 08/20/2017   NA 137 08/20/2017   K  4.5 08/20/2017   CL 106 08/20/2017   CO2 26 08/20/2017   GLUCOSE 74 08/20/2017   BUN 10 08/20/2017   CREATININE 0.68 08/20/2017   BILITOT 0.6 08/20/2017   ALKPHOS 223 (H) 04/13/2017   AST 39 (H) 08/20/2017   ALT 28 08/20/2017   PROT 8.9 (H) 08/20/2017   ALBUMIN 3.2 (L) 04/13/2017   CALCIUM 9.5 08/20/2017   GFRAA 103 08/20/2017    Speciality Comments: PLQ Eye Exam: 08/02/17 WNL follow up in 1 year  Procedures:  No procedures performed Allergies: Clindamycin; Iodinated  diagnostic agents; Metrizamide; Motrin [ibuprofen]; Milk-related compounds; and Shrimp [shellfish allergy]   Assessment / Plan:     Visit Diagnoses: No diagnosis found.   Orders: No orders of the defined types were placed in this encounter.  No orders of the defined types were placed in this encounter.   Face-to-face time spent with patient was *** minutes. Greater than 50% of time was spent in counseling and coordination of care.  Follow-Up Instructions: No follow-ups on file.   Earnestine Mealing, CMA  Note - This record has been created using Editor, commissioning.  Chart creation errors have been sought, but may not always  have been located. Such creation errors do not reflect on  the standard of medical care.

## 2018-01-18 ENCOUNTER — Other Ambulatory Visit: Payer: Self-pay | Admitting: Family Medicine

## 2018-01-18 ENCOUNTER — Other Ambulatory Visit: Payer: Self-pay | Admitting: Rheumatology

## 2018-01-18 ENCOUNTER — Telehealth: Payer: Self-pay

## 2018-01-18 DIAGNOSIS — R0982 Postnasal drip: Secondary | ICD-10-CM

## 2018-01-18 DIAGNOSIS — M0579 Rheumatoid arthritis with rheumatoid factor of multiple sites without organ or systems involvement: Secondary | ICD-10-CM

## 2018-01-18 DIAGNOSIS — K5909 Other constipation: Secondary | ICD-10-CM

## 2018-01-18 NOTE — Telephone Encounter (Signed)
Call received from [place] with [facility] who stated that they have received the referral but have not been able to reach the patient.

## 2018-01-19 NOTE — Telephone Encounter (Signed)
Last Visit: 08/25/17 Next Visit: 01/26/18 Labs: 08/20/17 AST improving. ALT WNL. Alk phos is elevated. CBC WNL.  PLQ Eye Exam: 08/02/17 WNL   Okay to refill per Dr. Deveshwar  

## 2018-01-26 ENCOUNTER — Ambulatory Visit: Payer: Medicare Other | Admitting: Rheumatology

## 2018-02-15 ENCOUNTER — Other Ambulatory Visit: Payer: Self-pay | Admitting: Rheumatology

## 2018-02-15 ENCOUNTER — Encounter: Payer: Self-pay | Admitting: Rheumatology

## 2018-02-15 DIAGNOSIS — M0579 Rheumatoid arthritis with rheumatoid factor of multiple sites without organ or systems involvement: Secondary | ICD-10-CM

## 2018-02-15 NOTE — Progress Notes (Deleted)
Office Visit Note  Patient: Kristy Oliver             Date of Birth: Apr 30, 1946           MRN: [id number]             PCP: Charlott Rakes, MD Referring: Charlott Rakes, MD Visit Date: 02/24/2018 Occupation: @GUAROCC @  Subjective:  No chief complaint on file.   History of Present Illness: Kristy Oliver is a 72 y.o. female ***   Activities of Daily Living:  Patient reports morning stiffness for *** {minute/hour:19697}.   Patient {ACTIONS;DENIES/REPORTS:21021675::"Denies"} nocturnal pain.  Difficulty dressing/grooming: {ACTIONS;DENIES/REPORTS:21021675::"Denies"} Difficulty climbing stairs: {ACTIONS;DENIES/REPORTS:21021675::"Denies"} Difficulty getting out of chair: {ACTIONS;DENIES/REPORTS:21021675::"Denies"} Difficulty using hands for taps, buttons, cutlery, and/or writing: {ACTIONS;DENIES/REPORTS:21021675::"Denies"}  No Rheumatology ROS completed.   PMFS History:  Patient Active Problem List   Diagnosis Date Noted  . Hx of adenomatous colonic polyps 12/29/2016  . Helicobacter pylori gastritis 12/13/2016  . History of asthma 07/16/2016  . History of depression 07/16/2016  . Compression fracture of body of thoracic vertebra ([place]) 07/16/2016  . Osteoporosis 07/07/2016  . 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 Moderate asthma without complication 52/77/8242  . High risk medication use 05/08/2016  . History of left shoulder replacement 05/08/2016  . Back pain 02/24/2016  . Tinea pedis 09/23/2015  . Depression 09/23/2015  . Hyperlipidemia 07/04/2015  . Chronic sinusitis 06/27/2015  . HTN (hypertension) 10/12/2013  . Smoker 10/12/2013  . Bloating symptom 10/12/2013  . RA (rheumatoid arthritis) ([place]) 06/30/2013  . Tobacco use 06/30/2013  . Poor dentition 11/17/2012    Past Medical History:  Diagnosis Date  . Allergy   . Arthritis   . Asthma    as young person, none currently  . Facial cellulitis 11/17/2012  . Gout   . Helicobacter pylori gastritis  12/13/2016  . HTN (hypertension), malignant 11/17/2012  . Hx of adenomatous colonic polyps 12/29/2016  . Hyperlipidemia    medicine currently  . Hypertension   . Osteoporosis   . Renal disorder   . Tuberculosis    pt took meds for 9 months - finish meds last week     Family History  Problem Relation Age of Onset  . Colon cancer Neg Hx   . Esophageal cancer Neg Hx   . Rectal cancer Neg Hx   . Stomach cancer Neg Hx    Past Surgical History:  Procedure Laterality Date  . BREAST LUMPECTOMY    . c-section x 2    . COLONOSCOPY    . JOINT REPLACEMENT     left knee & left shoulder  . left breast cyst removal    . UPPER GASTROINTESTINAL ENDOSCOPY    . WRIST SURGERY  2018   x2   Social History   Social History Narrative  . Not on file    Objective: Vital Signs: There were no vitals taken for this visit.   Physical Exam   Musculoskeletal Exam: ***  CDAI Exam: CDAI Score: Not documented Patient Global Assessment: Not documented; Provider Global Assessment: Not documented Swollen: Not documented; Tender: Not documented Joint Exam   Not documented   There is currently no information documented on the homunculus. Go to the Rheumatology activity and complete the homunculus joint exam.  Investigation: No additional findings.  Imaging: No results found.  Recent Labs: Lab Results  Component Value Date   WBC 7.7 08/20/2017   HGB 13.8 08/20/2017   PLT 317 08/20/2017   NA 137 08/20/2017   K  4.5 08/20/2017   CL 106 08/20/2017   CO2 26 08/20/2017   GLUCOSE 74 08/20/2017   BUN 10 08/20/2017   CREATININE 0.68 08/20/2017   BILITOT 0.6 08/20/2017   ALKPHOS 223 (H) 04/13/2017   AST 39 (H) 08/20/2017   ALT 28 08/20/2017   PROT 8.9 (H) 08/20/2017   ALBUMIN 3.2 (L) 04/13/2017   CALCIUM 9.5 08/20/2017   GFRAA 103 08/20/2017    Speciality Comments: PLQ Eye Exam: 08/02/17 WNL follow up in 1 year  Procedures:  No procedures performed Allergies: Clindamycin; Iodinated  diagnostic agents; Metrizamide; Motrin [ibuprofen]; Milk-related compounds; and Shrimp [shellfish allergy]   Assessment / Plan:     Visit Diagnoses: Rheumatoid arthritis involving multiple sites with positive rheumatoid factor (HCC) - Severe end-stage rheumatoid arthritis with mulitple contractures and nodulosis:  High risk medication use - PLQ 400 mg p.o. daily, methotrexate discontinued due to elevation of LFTs.PLQ eye exam: August 02, 2017 was normal.  Age-related osteoporosis without current pathological fracture  Other secondary kyphosis, thoracic region  History of left shoulder replacement  Positive 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 - Treated with Rifampin  History of asthma  History of depression  Tobacco use  Elevated alkaline phosphatase level   Orders: No orders of the defined types were placed in this encounter.  No orders of the defined types were placed in this encounter.   Face-to-face time spent with patient was *** minutes. Greater than 50% of time was spent in counseling and coordination of care.  Follow-Up Instructions: No follow-ups on file.   Ofilia Neas, PA-C  Note - This record has been created using Dragon software.  Chart creation errors have been sought, but may not always  have been located. Such creation errors do not reflect on  the standard of medical care.

## 2018-02-16 NOTE — Telephone Encounter (Signed)
Last Visit: 08/25/17 Next Visit:02/24/18 Labs: 08/20/17 AST improving. ALT WNL. Alk phos is elevated. CBC WNL.  PLQ Eye Exam: 08/02/17 WNL   Okay to refill per Dr. Estanislado Pandy

## 2018-02-21 ENCOUNTER — Ambulatory Visit: Payer: Self-pay | Admitting: Family Medicine

## 2018-02-22 ENCOUNTER — Ambulatory Visit: Payer: Self-pay | Admitting: Family Medicine

## 2018-02-24 ENCOUNTER — Ambulatory Visit: Payer: Medicare Other | Admitting: Physician Assistant

## 2018-03-09 ENCOUNTER — Telehealth: Payer: Self-pay | Admitting: Family Medicine

## 2018-03-09 DIAGNOSIS — M546 Pain in thoracic spine: Secondary | ICD-10-CM

## 2018-03-09 DIAGNOSIS — K5909 Other constipation: Secondary | ICD-10-CM

## 2018-03-09 DIAGNOSIS — G8929 Other chronic pain: Secondary | ICD-10-CM

## 2018-03-09 DIAGNOSIS — I10 Essential (primary) hypertension: Secondary | ICD-10-CM

## 2018-03-09 DIAGNOSIS — M818 Other osteoporosis without current pathological fracture: Secondary | ICD-10-CM

## 2018-03-09 DIAGNOSIS — R0982 Postnasal drip: Secondary | ICD-10-CM

## 2018-03-09 DIAGNOSIS — M0579 Rheumatoid arthritis with rheumatoid factor of multiple sites without organ or systems involvement: Secondary | ICD-10-CM

## 2018-03-09 NOTE — Telephone Encounter (Signed)
1) Medication(s) Requested (by name):  Zyrtec  norvasc Folic acid Plaquenil zanaflex Carvedilol Fosamax 70mg  chronulac 2) Pharmacy of Choice: [facility] 3) Special Requests:   Approved medications will be sent to the pharmacy, we will reach out if there is an issue.  Requests made after 3pm may not be addressed until the following business day!  If a patient is unsure of the name of the medication(s) please note and ask patient to call back when they are able to provide all info, do not send to responsible party until all information is available!

## 2018-03-14 MED ORDER — CETIRIZINE HCL 10 MG PO TABS: 10.0000 mg | ORAL_TABLET | Freq: Every day | ORAL | 0 refills | Status: DC

## 2018-03-14 MED ORDER — AMLODIPINE BESYLATE 10 MG PO TABS: 10.0000 mg | ORAL_TABLET | Freq: Every day | ORAL | 0 refills | Status: DC

## 2018-03-14 MED ORDER — TIZANIDINE HCL 4 MG PO TABS: 4.0000 mg | ORAL_TABLET | Freq: Three times a day (TID) | ORAL | 2 refills | Status: DC | PRN

## 2018-03-14 MED ORDER — CARVEDILOL 6.25 MG PO TABS: ORAL_TABLET | ORAL | 0 refills | Status: DC

## 2018-03-14 MED ORDER — FOLIC ACID 400 MCG PO TABS: 400.0000 ug | ORAL_TABLET | Freq: Every day | ORAL | 0 refills | Status: DC

## 2018-03-15 MED ORDER — ALENDRONATE SODIUM 70 MG PO TABS: 70.0000 mg | ORAL_TABLET | ORAL | 1 refills | Status: DC

## 2018-03-15 MED ORDER — LACTULOSE 10 GM/15ML PO SOLN: ORAL | 1 refills | Status: DC

## 2018-03-15 NOTE — Telephone Encounter (Signed)
Refilled. Plaquenil should come from her Rheumatologist.

## 2018-03-21 NOTE — Progress Notes (Deleted)
Office Visit Note  Patient: Kristy Oliver             Date of Birth: April 18, 1946           MRN: [id number]             PCP: Charlott Rakes, MD Referring: Charlott Rakes, MD Visit Date: 04/04/2018 Occupation: @GUAROCC @  Subjective:  No chief complaint on file.  Current regimen includes Plaquenil 200 mg daily and prednisone 5 mg daily. She was previously on methotrexate but it was discontinued due to elevated LFT's.  She declined biologics at last office visit.  She was to get a total body bone scan in May but was canceled since patient could not be reached to confirm.   Last PLQ eye exam on 08/02/17.  Most recent CBC/CMP showed her AST/ALT were improving on 08/20/17.  CBC/CMP ordered for today and then every 5 months to monitor for drug toxicity.  Standing orders are in place. Recommend flu, Prevnar 13, and Shingrix as indicated.  She is currently on fosamax which is managed by her PCP.  History of Present Illness: Fiorela Pelzer is a 72 y.o. female with history of severe end-stage rheumatoid arthritis and osteoperosis.  Activities of Daily Living:  Patient reports morning stiffness for *** {minute/hour:19697}.   Patient {ACTIONS;DENIES/REPORTS:21021675::"Denies"} nocturnal pain.  Difficulty dressing/grooming: {ACTIONS;DENIES/REPORTS:21021675::"Denies"} Difficulty climbing stairs: {ACTIONS;DENIES/REPORTS:21021675::"Denies"} Difficulty getting out of chair: {ACTIONS;DENIES/REPORTS:21021675::"Denies"} Difficulty using hands for taps, buttons, cutlery, and/or writing: {ACTIONS;DENIES/REPORTS:21021675::"Denies"}  No Rheumatology ROS completed.   PMFS History:  Patient Active Problem List   Diagnosis Date Noted  . Hx of adenomatous colonic polyps 12/29/2016  . Helicobacter pylori gastritis 12/13/2016  . History of asthma 07/16/2016  . History of depression 07/16/2016  . Compression fracture of body of thoracic vertebra ([place]) 07/16/2016  . Osteoporosis 07/07/2016  . Positive  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 06/29/2016  . Moderate asthma without complication 40/81/4481  . High risk medication use 05/08/2016  . History of left shoulder replacement 05/08/2016  . Back pain 02/24/2016  . Tinea pedis 09/23/2015  . Depression 09/23/2015  . Hyperlipidemia 07/04/2015  . Chronic sinusitis 06/27/2015  . HTN (hypertension) 10/12/2013  . Smoker 10/12/2013  . Bloating symptom 10/12/2013  . RA (rheumatoid arthritis) ([place]) 06/30/2013  . Tobacco use 06/30/2013  . Poor dentition 11/17/2012    Past Medical History:  Diagnosis Date  . Allergy   . Arthritis   . Asthma    as young person, none currently  . Facial cellulitis 11/17/2012  . Gout   . Helicobacter pylori gastritis 12/13/2016  . HTN (hypertension), malignant 11/17/2012  . Hx of adenomatous colonic polyps 12/29/2016  . Hyperlipidemia    medicine currently  . Hypertension   . Osteoporosis   . Renal disorder   . Tuberculosis    pt took meds for 9 months - finish meds last week     Family History  Problem Relation Age of Onset  . Colon cancer Neg Hx   . Esophageal cancer Neg Hx   . Rectal cancer Neg Hx   . Stomach cancer Neg Hx    Past Surgical History:  Procedure Laterality Date  . BREAST LUMPECTOMY    . c-section x 2    . COLONOSCOPY    . JOINT REPLACEMENT     left knee & left shoulder  . left breast cyst removal    . UPPER GASTROINTESTINAL ENDOSCOPY    . WRIST SURGERY  2018   x2   Social History   Social  History Narrative  . Not on file    Objective: Vital Signs: There were no vitals taken for this visit.   Physical Exam   Musculoskeletal Exam: ***  CDAI Exam: CDAI Score: Not documented Patient Global Assessment: Not documented; Provider Global Assessment: Not documented Swollen: Not documented; Tender: Not documented Joint Exam   Not documented   There is currently no information documented on the homunculus. Go to the Rheumatology activity and complete the homunculus joint  exam.  Investigation: No additional findings.  Imaging: No results found.  Recent Labs: Lab Results  Component Value Date   WBC 7.7 08/20/2017   HGB 13.8 08/20/2017   PLT 317 08/20/2017   NA 137 08/20/2017   K 4.5 08/20/2017   CL 106 08/20/2017   CO2 26 08/20/2017   GLUCOSE 74 08/20/2017   BUN 10 08/20/2017   CREATININE 0.68 08/20/2017   BILITOT 0.6 08/20/2017   ALKPHOS 223 (H) 04/13/2017   AST 39 (H) 08/20/2017   ALT 28 08/20/2017   PROT 8.9 (H) 08/20/2017   ALBUMIN 3.2 (L) 04/13/2017   CALCIUM 9.5 08/20/2017   GFRAA 103 08/20/2017    Speciality Comments: PLQ Eye Exam: 08/02/17 WNL follow up in 1 year  Procedures:  No procedures performed Allergies: Clindamycin; Iodinated diagnostic agents; Metrizamide; Motrin [ibuprofen]; Milk-related compounds; and Shrimp [shellfish allergy]   Assessment / Plan:     Visit Diagnoses: Rheumatoid arthritis involving multiple sites with positive rheumatoid factor (HCC) -  Severe end-stage rheumatoid arthritis with mulitple contractures and nodulosis:  High risk medication use - PLQ 400 mg p.o. daily, methotrexate discontinued due to elevation of LFTs.PLQ eye exam: August 02, 2017 was normal  Age-related osteoporosis without current pathological fracture - taking Fosamax.  Her DEXA is followed by her PCP.  Elevated alkaline phosphatase level  Other secondary kyphosis, thoracic region  Compression fracture of body of thoracic vertebra (HCC)  History of left shoulder replacement  Positive 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 - She was treated with Rifampin.  History of asthma  History of depression  Tobacco use  Essential hypertension  Mixed hyperlipidemia   Orders: No orders of the defined types were placed in this encounter.  No orders of the defined types were placed in this encounter.   Face-to-face time spent with patient was *** minutes. Greater than 50% of time was spent in counseling and coordination of care.  Follow-Up  Instructions: No follow-ups on file.   Ofilia Neas, PA-C  Note - This record has been created using Dragon software.  Chart creation errors have been sought, but may not always  have been located. Such creation errors do not reflect on  the standard of medical care.

## 2018-03-24 ENCOUNTER — Other Ambulatory Visit: Payer: Self-pay | Admitting: Rheumatology

## 2018-03-24 ENCOUNTER — Other Ambulatory Visit: Payer: Self-pay | Admitting: Family Medicine

## 2018-03-24 DIAGNOSIS — M818 Other osteoporosis without current pathological fracture: Secondary | ICD-10-CM

## 2018-03-24 DIAGNOSIS — G8929 Other chronic pain: Secondary | ICD-10-CM

## 2018-03-24 DIAGNOSIS — M546 Pain in thoracic spine: Secondary | ICD-10-CM

## 2018-03-24 DIAGNOSIS — I10 Essential (primary) hypertension: Secondary | ICD-10-CM

## 2018-03-24 DIAGNOSIS — J328 Other chronic sinusitis: Secondary | ICD-10-CM

## 2018-03-24 DIAGNOSIS — M0579 Rheumatoid arthritis with rheumatoid factor of multiple sites without organ or systems involvement: Secondary | ICD-10-CM

## 2018-03-25 NOTE — Telephone Encounter (Signed)
Last Visit: 08/25/17 Next Visit:04/04/18 Labs: 3/29/19AST improving. ALT WNL.Alk phos is elevated.CBC WNL.  PLQ Eye Exam: 08/02/17 WNL  Okay to refill 30 day supply PLQ?

## 2018-03-25 NOTE — Telephone Encounter (Signed)
ok 

## 2018-03-29 ENCOUNTER — Ambulatory Visit: Payer: Medicare Other | Admitting: Podiatry

## 2018-04-04 ENCOUNTER — Encounter: Payer: Self-pay | Admitting: *Deleted

## 2018-04-04 ENCOUNTER — Ambulatory Visit: Payer: Medicare Other | Admitting: Physician Assistant

## 2018-04-11 DIAGNOSIS — F17210 Nicotine dependence, cigarettes, uncomplicated: Secondary | ICD-10-CM | POA: Diagnosis not present

## 2018-04-11 DIAGNOSIS — Z96612 Presence of left artificial shoulder joint: Secondary | ICD-10-CM | POA: Diagnosis not present

## 2018-04-11 DIAGNOSIS — S79912A Unspecified injury of left hip, initial encounter: Secondary | ICD-10-CM | POA: Diagnosis not present

## 2018-04-11 DIAGNOSIS — S42112A Displaced fracture of body of scapula, left shoulder, initial encounter for closed fracture: Secondary | ICD-10-CM | POA: Diagnosis not present

## 2018-04-11 DIAGNOSIS — M25512 Pain in left shoulder: Secondary | ICD-10-CM | POA: Diagnosis not present

## 2018-04-11 DIAGNOSIS — M05731 Rheumatoid arthritis with rheumatoid factor of right wrist without organ or systems involvement: Secondary | ICD-10-CM | POA: Diagnosis not present

## 2018-04-11 DIAGNOSIS — M549 Dorsalgia, unspecified: Secondary | ICD-10-CM | POA: Diagnosis not present

## 2018-04-11 DIAGNOSIS — R52 Pain, unspecified: Secondary | ICD-10-CM | POA: Diagnosis not present

## 2018-04-11 DIAGNOSIS — G8929 Other chronic pain: Secondary | ICD-10-CM | POA: Diagnosis not present

## 2018-04-11 DIAGNOSIS — W19XXXA Unspecified fall, initial encounter: Secondary | ICD-10-CM | POA: Diagnosis not present

## 2018-04-11 DIAGNOSIS — S72002A Fracture of unspecified part of neck of left femur, initial encounter for closed fracture: Secondary | ICD-10-CM | POA: Insufficient documentation

## 2018-04-11 DIAGNOSIS — S4992XA Unspecified injury of left shoulder and upper arm, initial encounter: Secondary | ICD-10-CM | POA: Diagnosis not present

## 2018-04-11 DIAGNOSIS — S42412A Displaced simple supracondylar fracture without intercondylar fracture of left humerus, initial encounter for closed fracture: Secondary | ICD-10-CM | POA: Diagnosis not present

## 2018-04-11 DIAGNOSIS — M542 Cervicalgia: Secondary | ICD-10-CM | POA: Diagnosis not present

## 2018-04-11 DIAGNOSIS — M109 Gout, unspecified: Secondary | ICD-10-CM | POA: Diagnosis not present

## 2018-04-11 DIAGNOSIS — S199XXA Unspecified injury of neck, initial encounter: Secondary | ICD-10-CM | POA: Diagnosis not present

## 2018-04-11 DIAGNOSIS — Z7409 Other reduced mobility: Secondary | ICD-10-CM | POA: Diagnosis not present

## 2018-04-11 DIAGNOSIS — M546 Pain in thoracic spine: Secondary | ICD-10-CM | POA: Diagnosis not present

## 2018-04-11 DIAGNOSIS — M1A9XX1 Chronic gout, unspecified, with tophus (tophi): Secondary | ICD-10-CM | POA: Diagnosis not present

## 2018-04-11 DIAGNOSIS — W010XXA Fall on same level from slipping, tripping and stumbling without subsequent striking against object, initial encounter: Secondary | ICD-10-CM | POA: Diagnosis not present

## 2018-04-11 DIAGNOSIS — D72829 Elevated white blood cell count, unspecified: Secondary | ICD-10-CM | POA: Diagnosis not present

## 2018-04-11 DIAGNOSIS — E43 Unspecified severe protein-calorie malnutrition: Secondary | ICD-10-CM | POA: Diagnosis not present

## 2018-04-11 DIAGNOSIS — Y92009 Unspecified place in unspecified non-institutional (private) residence as the place of occurrence of the external cause: Secondary | ICD-10-CM | POA: Diagnosis not present

## 2018-04-11 DIAGNOSIS — S72142A Displaced intertrochanteric fracture of left femur, initial encounter for closed fracture: Secondary | ICD-10-CM | POA: Diagnosis not present

## 2018-04-11 DIAGNOSIS — M25552 Pain in left hip: Secondary | ICD-10-CM | POA: Diagnosis not present

## 2018-04-11 DIAGNOSIS — S299XXA Unspecified injury of thorax, initial encounter: Secondary | ICD-10-CM | POA: Diagnosis not present

## 2018-04-11 DIAGNOSIS — N189 Chronic kidney disease, unspecified: Secondary | ICD-10-CM | POA: Diagnosis not present

## 2018-04-11 DIAGNOSIS — R9431 Abnormal electrocardiogram [ECG] [EKG]: Secondary | ICD-10-CM | POA: Diagnosis not present

## 2018-04-11 DIAGNOSIS — S42102A Fracture of unspecified part of scapula, left shoulder, initial encounter for closed fracture: Secondary | ICD-10-CM | POA: Insufficient documentation

## 2018-04-11 DIAGNOSIS — M05732 Rheumatoid arthritis with rheumatoid factor of left wrist without organ or systems involvement: Secondary | ICD-10-CM | POA: Diagnosis not present

## 2018-04-11 DIAGNOSIS — E872 Acidosis: Secondary | ICD-10-CM | POA: Diagnosis not present

## 2018-04-11 DIAGNOSIS — M810 Age-related osteoporosis without current pathological fracture: Secondary | ICD-10-CM | POA: Diagnosis not present

## 2018-04-11 DIAGNOSIS — S42425A Nondisplaced comminuted supracondylar fracture without intercondylar fracture of left humerus, initial encounter for closed fracture: Secondary | ICD-10-CM | POA: Diagnosis not present

## 2018-04-11 DIAGNOSIS — S3992XA Unspecified injury of lower back, initial encounter: Secondary | ICD-10-CM | POA: Diagnosis not present

## 2018-04-11 DIAGNOSIS — I129 Hypertensive chronic kidney disease with stage 1 through stage 4 chronic kidney disease, or unspecified chronic kidney disease: Secondary | ICD-10-CM | POA: Diagnosis not present

## 2018-04-11 DIAGNOSIS — I10 Essential (primary) hypertension: Secondary | ICD-10-CM | POA: Diagnosis not present

## 2018-04-11 DIAGNOSIS — R918 Other nonspecific abnormal finding of lung field: Secondary | ICD-10-CM | POA: Diagnosis not present

## 2018-04-11 DIAGNOSIS — Y9301 Activity, walking, marching and hiking: Secondary | ICD-10-CM | POA: Diagnosis not present

## 2018-04-11 DIAGNOSIS — S7292XA Unspecified fracture of left femur, initial encounter for closed fracture: Secondary | ICD-10-CM | POA: Diagnosis not present

## 2018-04-12 DIAGNOSIS — S42412A Displaced simple supracondylar fracture without intercondylar fracture of left humerus, initial encounter for closed fracture: Secondary | ICD-10-CM | POA: Diagnosis not present

## 2018-04-12 DIAGNOSIS — M109 Gout, unspecified: Secondary | ICD-10-CM | POA: Diagnosis not present

## 2018-04-12 DIAGNOSIS — M05732 Rheumatoid arthritis with rheumatoid factor of left wrist without organ or systems involvement: Secondary | ICD-10-CM | POA: Diagnosis not present

## 2018-04-12 DIAGNOSIS — M05731 Rheumatoid arthritis with rheumatoid factor of right wrist without organ or systems involvement: Secondary | ICD-10-CM | POA: Diagnosis not present

## 2018-04-12 DIAGNOSIS — E43 Unspecified severe protein-calorie malnutrition: Secondary | ICD-10-CM | POA: Diagnosis not present

## 2018-04-12 DIAGNOSIS — E872 Acidosis: Secondary | ICD-10-CM | POA: Diagnosis not present

## 2018-04-12 DIAGNOSIS — D72829 Elevated white blood cell count, unspecified: Secondary | ICD-10-CM | POA: Diagnosis not present

## 2018-04-12 DIAGNOSIS — S42425A Nondisplaced comminuted supracondylar fracture without intercondylar fracture of left humerus, initial encounter for closed fracture: Secondary | ICD-10-CM | POA: Diagnosis not present

## 2018-04-12 DIAGNOSIS — S42102A Fracture of unspecified part of scapula, left shoulder, initial encounter for closed fracture: Secondary | ICD-10-CM | POA: Diagnosis not present

## 2018-04-12 DIAGNOSIS — S72002A Fracture of unspecified part of neck of left femur, initial encounter for closed fracture: Secondary | ICD-10-CM | POA: Diagnosis not present

## 2018-04-12 DIAGNOSIS — F17210 Nicotine dependence, cigarettes, uncomplicated: Secondary | ICD-10-CM | POA: Diagnosis not present

## 2018-04-12 DIAGNOSIS — S72142A Displaced intertrochanteric fracture of left femur, initial encounter for closed fracture: Secondary | ICD-10-CM | POA: Diagnosis not present

## 2018-04-12 DIAGNOSIS — I10 Essential (primary) hypertension: Secondary | ICD-10-CM | POA: Diagnosis not present

## 2018-04-13 DIAGNOSIS — S72002A Fracture of unspecified part of neck of left femur, initial encounter for closed fracture: Secondary | ICD-10-CM | POA: Diagnosis not present

## 2018-04-13 DIAGNOSIS — I10 Essential (primary) hypertension: Secondary | ICD-10-CM | POA: Diagnosis not present

## 2018-04-13 DIAGNOSIS — S42102A Fracture of unspecified part of scapula, left shoulder, initial encounter for closed fracture: Secondary | ICD-10-CM | POA: Diagnosis not present

## 2018-04-13 DIAGNOSIS — S42412A Displaced simple supracondylar fracture without intercondylar fracture of left humerus, initial encounter for closed fracture: Secondary | ICD-10-CM | POA: Diagnosis not present

## 2018-04-13 DIAGNOSIS — E872 Acidosis: Secondary | ICD-10-CM | POA: Diagnosis not present

## 2018-04-13 DIAGNOSIS — M069 Rheumatoid arthritis, unspecified: Secondary | ICD-10-CM | POA: Diagnosis not present

## 2018-04-13 DIAGNOSIS — G8929 Other chronic pain: Secondary | ICD-10-CM | POA: Diagnosis not present

## 2018-04-13 DIAGNOSIS — M109 Gout, unspecified: Secondary | ICD-10-CM | POA: Diagnosis not present

## 2018-04-13 DIAGNOSIS — D62 Acute posthemorrhagic anemia: Secondary | ICD-10-CM | POA: Diagnosis not present

## 2018-04-13 DIAGNOSIS — M549 Dorsalgia, unspecified: Secondary | ICD-10-CM | POA: Diagnosis not present

## 2018-04-13 DIAGNOSIS — E43 Unspecified severe protein-calorie malnutrition: Secondary | ICD-10-CM | POA: Diagnosis not present

## 2018-04-13 DIAGNOSIS — S72142A Displaced intertrochanteric fracture of left femur, initial encounter for closed fracture: Secondary | ICD-10-CM | POA: Diagnosis not present

## 2018-04-14 DIAGNOSIS — E43 Unspecified severe protein-calorie malnutrition: Secondary | ICD-10-CM | POA: Diagnosis not present

## 2018-04-14 DIAGNOSIS — I10 Essential (primary) hypertension: Secondary | ICD-10-CM | POA: Diagnosis not present

## 2018-04-14 DIAGNOSIS — G8929 Other chronic pain: Secondary | ICD-10-CM | POA: Diagnosis not present

## 2018-04-14 DIAGNOSIS — S42102A Fracture of unspecified part of scapula, left shoulder, initial encounter for closed fracture: Secondary | ICD-10-CM | POA: Diagnosis not present

## 2018-04-14 DIAGNOSIS — S42412A Displaced simple supracondylar fracture without intercondylar fracture of left humerus, initial encounter for closed fracture: Secondary | ICD-10-CM | POA: Diagnosis not present

## 2018-04-14 DIAGNOSIS — M109 Gout, unspecified: Secondary | ICD-10-CM | POA: Diagnosis not present

## 2018-04-14 DIAGNOSIS — E872 Acidosis: Secondary | ICD-10-CM | POA: Diagnosis not present

## 2018-04-14 DIAGNOSIS — S72002A Fracture of unspecified part of neck of left femur, initial encounter for closed fracture: Secondary | ICD-10-CM | POA: Diagnosis not present

## 2018-04-14 DIAGNOSIS — D62 Acute posthemorrhagic anemia: Secondary | ICD-10-CM | POA: Diagnosis not present

## 2018-04-14 DIAGNOSIS — M549 Dorsalgia, unspecified: Secondary | ICD-10-CM | POA: Diagnosis not present

## 2018-04-14 DIAGNOSIS — M069 Rheumatoid arthritis, unspecified: Secondary | ICD-10-CM | POA: Diagnosis not present

## 2018-04-14 DIAGNOSIS — S72142A Displaced intertrochanteric fracture of left femur, initial encounter for closed fracture: Secondary | ICD-10-CM | POA: Diagnosis not present

## 2018-04-15 DIAGNOSIS — F17210 Nicotine dependence, cigarettes, uncomplicated: Secondary | ICD-10-CM | POA: Diagnosis not present

## 2018-04-15 DIAGNOSIS — S42412A Displaced simple supracondylar fracture without intercondylar fracture of left humerus, initial encounter for closed fracture: Secondary | ICD-10-CM | POA: Diagnosis not present

## 2018-04-15 DIAGNOSIS — G8929 Other chronic pain: Secondary | ICD-10-CM | POA: Diagnosis not present

## 2018-04-15 DIAGNOSIS — S42102A Fracture of unspecified part of scapula, left shoulder, initial encounter for closed fracture: Secondary | ICD-10-CM | POA: Diagnosis not present

## 2018-04-15 DIAGNOSIS — I10 Essential (primary) hypertension: Secondary | ICD-10-CM | POA: Diagnosis not present

## 2018-04-15 DIAGNOSIS — S72142A Displaced intertrochanteric fracture of left femur, initial encounter for closed fracture: Secondary | ICD-10-CM | POA: Diagnosis not present

## 2018-04-15 DIAGNOSIS — M109 Gout, unspecified: Secondary | ICD-10-CM | POA: Diagnosis not present

## 2018-04-15 DIAGNOSIS — I129 Hypertensive chronic kidney disease with stage 1 through stage 4 chronic kidney disease, or unspecified chronic kidney disease: Secondary | ICD-10-CM | POA: Diagnosis not present

## 2018-04-15 DIAGNOSIS — S72002A Fracture of unspecified part of neck of left femur, initial encounter for closed fracture: Secondary | ICD-10-CM | POA: Diagnosis not present

## 2018-04-15 DIAGNOSIS — R0781 Pleurodynia: Secondary | ICD-10-CM | POA: Diagnosis not present

## 2018-04-15 DIAGNOSIS — S72142D Displaced intertrochanteric fracture of left femur, subsequent encounter for closed fracture with routine healing: Secondary | ICD-10-CM | POA: Diagnosis not present

## 2018-04-15 DIAGNOSIS — M4854XD Collapsed vertebra, not elsewhere classified, thoracic region, subsequent encounter for fracture with routine healing: Secondary | ICD-10-CM | POA: Diagnosis not present

## 2018-04-15 DIAGNOSIS — M549 Dorsalgia, unspecified: Secondary | ICD-10-CM | POA: Diagnosis not present

## 2018-04-15 DIAGNOSIS — M069 Rheumatoid arthritis, unspecified: Secondary | ICD-10-CM | POA: Diagnosis not present

## 2018-04-15 DIAGNOSIS — R509 Fever, unspecified: Secondary | ICD-10-CM | POA: Diagnosis not present

## 2018-04-15 DIAGNOSIS — K5900 Constipation, unspecified: Secondary | ICD-10-CM | POA: Diagnosis not present

## 2018-04-15 DIAGNOSIS — R269 Unspecified abnormalities of gait and mobility: Secondary | ICD-10-CM | POA: Diagnosis not present

## 2018-04-15 DIAGNOSIS — Z4789 Encounter for other orthopedic aftercare: Secondary | ICD-10-CM | POA: Diagnosis not present

## 2018-04-15 DIAGNOSIS — S42412D Displaced simple supracondylar fracture without intercondylar fracture of left humerus, subsequent encounter for fracture with routine healing: Secondary | ICD-10-CM | POA: Diagnosis not present

## 2018-04-15 DIAGNOSIS — D62 Acute posthemorrhagic anemia: Secondary | ICD-10-CM | POA: Diagnosis not present

## 2018-04-15 DIAGNOSIS — S42192D Fracture of other part of scapula, left shoulder, subsequent encounter for fracture with routine healing: Secondary | ICD-10-CM | POA: Diagnosis not present

## 2018-04-15 DIAGNOSIS — E871 Hypo-osmolality and hyponatremia: Secondary | ICD-10-CM | POA: Diagnosis not present

## 2018-04-15 DIAGNOSIS — N189 Chronic kidney disease, unspecified: Secondary | ICD-10-CM | POA: Diagnosis not present

## 2018-04-15 DIAGNOSIS — S42102D Fracture of unspecified part of scapula, left shoulder, subsequent encounter for fracture with routine healing: Secondary | ICD-10-CM | POA: Diagnosis not present

## 2018-04-15 DIAGNOSIS — E872 Acidosis: Secondary | ICD-10-CM | POA: Diagnosis not present

## 2018-04-15 DIAGNOSIS — E43 Unspecified severe protein-calorie malnutrition: Secondary | ICD-10-CM | POA: Diagnosis not present

## 2018-04-16 DIAGNOSIS — D72829 Elevated white blood cell count, unspecified: Secondary | ICD-10-CM | POA: Diagnosis not present

## 2018-04-16 DIAGNOSIS — S42415A Nondisplaced simple supracondylar fracture without intercondylar fracture of left humerus, initial encounter for closed fracture: Secondary | ICD-10-CM | POA: Diagnosis not present

## 2018-04-16 DIAGNOSIS — S42412D Displaced simple supracondylar fracture without intercondylar fracture of left humerus, subsequent encounter for fracture with routine healing: Secondary | ICD-10-CM | POA: Diagnosis not present

## 2018-04-16 DIAGNOSIS — S42102A Fracture of unspecified part of scapula, left shoulder, initial encounter for closed fracture: Secondary | ICD-10-CM | POA: Diagnosis not present

## 2018-04-16 DIAGNOSIS — M549 Dorsalgia, unspecified: Secondary | ICD-10-CM | POA: Diagnosis not present

## 2018-04-16 DIAGNOSIS — I10 Essential (primary) hypertension: Secondary | ICD-10-CM | POA: Diagnosis not present

## 2018-04-16 DIAGNOSIS — I129 Hypertensive chronic kidney disease with stage 1 through stage 4 chronic kidney disease, or unspecified chronic kidney disease: Secondary | ICD-10-CM | POA: Diagnosis not present

## 2018-04-16 DIAGNOSIS — S72145A Nondisplaced intertrochanteric fracture of left femur, initial encounter for closed fracture: Secondary | ICD-10-CM | POA: Diagnosis not present

## 2018-04-16 DIAGNOSIS — S42192D Fracture of other part of scapula, left shoulder, subsequent encounter for fracture with routine healing: Secondary | ICD-10-CM | POA: Diagnosis not present

## 2018-04-16 DIAGNOSIS — E871 Hypo-osmolality and hyponatremia: Secondary | ICD-10-CM | POA: Diagnosis not present

## 2018-04-16 DIAGNOSIS — E43 Unspecified severe protein-calorie malnutrition: Secondary | ICD-10-CM | POA: Diagnosis not present

## 2018-04-16 DIAGNOSIS — M069 Rheumatoid arthritis, unspecified: Secondary | ICD-10-CM | POA: Diagnosis not present

## 2018-04-16 DIAGNOSIS — K5900 Constipation, unspecified: Secondary | ICD-10-CM | POA: Diagnosis not present

## 2018-04-16 DIAGNOSIS — Z720 Tobacco use: Secondary | ICD-10-CM | POA: Diagnosis not present

## 2018-04-16 DIAGNOSIS — S72142D Displaced intertrochanteric fracture of left femur, subsequent encounter for closed fracture with routine healing: Secondary | ICD-10-CM | POA: Diagnosis not present

## 2018-04-17 DIAGNOSIS — S72145A Nondisplaced intertrochanteric fracture of left femur, initial encounter for closed fracture: Secondary | ICD-10-CM | POA: Diagnosis not present

## 2018-04-17 DIAGNOSIS — D72829 Elevated white blood cell count, unspecified: Secondary | ICD-10-CM | POA: Diagnosis not present

## 2018-04-17 DIAGNOSIS — S72142D Displaced intertrochanteric fracture of left femur, subsequent encounter for closed fracture with routine healing: Secondary | ICD-10-CM | POA: Diagnosis not present

## 2018-04-17 DIAGNOSIS — S42192D Fracture of other part of scapula, left shoulder, subsequent encounter for fracture with routine healing: Secondary | ICD-10-CM | POA: Diagnosis not present

## 2018-04-17 DIAGNOSIS — E871 Hypo-osmolality and hyponatremia: Secondary | ICD-10-CM | POA: Diagnosis not present

## 2018-04-17 DIAGNOSIS — I129 Hypertensive chronic kidney disease with stage 1 through stage 4 chronic kidney disease, or unspecified chronic kidney disease: Secondary | ICD-10-CM | POA: Diagnosis not present

## 2018-04-17 DIAGNOSIS — I10 Essential (primary) hypertension: Secondary | ICD-10-CM | POA: Diagnosis not present

## 2018-04-17 DIAGNOSIS — M549 Dorsalgia, unspecified: Secondary | ICD-10-CM | POA: Diagnosis not present

## 2018-04-17 DIAGNOSIS — S42102A Fracture of unspecified part of scapula, left shoulder, initial encounter for closed fracture: Secondary | ICD-10-CM | POA: Diagnosis not present

## 2018-04-17 DIAGNOSIS — S42412D Displaced simple supracondylar fracture without intercondylar fracture of left humerus, subsequent encounter for fracture with routine healing: Secondary | ICD-10-CM | POA: Diagnosis not present

## 2018-04-17 DIAGNOSIS — K5900 Constipation, unspecified: Secondary | ICD-10-CM | POA: Diagnosis not present

## 2018-04-17 DIAGNOSIS — E43 Unspecified severe protein-calorie malnutrition: Secondary | ICD-10-CM | POA: Diagnosis not present

## 2018-04-17 DIAGNOSIS — S42415A Nondisplaced simple supracondylar fracture without intercondylar fracture of left humerus, initial encounter for closed fracture: Secondary | ICD-10-CM | POA: Diagnosis not present

## 2018-04-17 DIAGNOSIS — Z720 Tobacco use: Secondary | ICD-10-CM | POA: Diagnosis not present

## 2018-04-17 DIAGNOSIS — M069 Rheumatoid arthritis, unspecified: Secondary | ICD-10-CM | POA: Diagnosis not present

## 2018-04-18 DIAGNOSIS — Z7409 Other reduced mobility: Secondary | ICD-10-CM | POA: Diagnosis not present

## 2018-04-18 DIAGNOSIS — I10 Essential (primary) hypertension: Secondary | ICD-10-CM | POA: Diagnosis not present

## 2018-04-18 DIAGNOSIS — M069 Rheumatoid arthritis, unspecified: Secondary | ICD-10-CM | POA: Diagnosis not present

## 2018-04-18 DIAGNOSIS — S42412D Displaced simple supracondylar fracture without intercondylar fracture of left humerus, subsequent encounter for fracture with routine healing: Secondary | ICD-10-CM | POA: Diagnosis not present

## 2018-04-18 DIAGNOSIS — S72142D Displaced intertrochanteric fracture of left femur, subsequent encounter for closed fracture with routine healing: Secondary | ICD-10-CM | POA: Diagnosis not present

## 2018-04-18 DIAGNOSIS — Z720 Tobacco use: Secondary | ICD-10-CM | POA: Diagnosis not present

## 2018-04-18 DIAGNOSIS — S42102D Fracture of unspecified part of scapula, left shoulder, subsequent encounter for fracture with routine healing: Secondary | ICD-10-CM | POA: Diagnosis not present

## 2018-04-18 DIAGNOSIS — E785 Hyperlipidemia, unspecified: Secondary | ICD-10-CM | POA: Diagnosis not present

## 2018-04-18 DIAGNOSIS — D62 Acute posthemorrhagic anemia: Secondary | ICD-10-CM | POA: Diagnosis not present

## 2018-04-18 DIAGNOSIS — E43 Unspecified severe protein-calorie malnutrition: Secondary | ICD-10-CM | POA: Diagnosis not present

## 2018-04-18 DIAGNOSIS — E871 Hypo-osmolality and hyponatremia: Secondary | ICD-10-CM | POA: Diagnosis not present

## 2018-04-18 DIAGNOSIS — K5900 Constipation, unspecified: Secondary | ICD-10-CM | POA: Diagnosis not present

## 2018-04-18 DIAGNOSIS — I129 Hypertensive chronic kidney disease with stage 1 through stage 4 chronic kidney disease, or unspecified chronic kidney disease: Secondary | ICD-10-CM | POA: Diagnosis not present

## 2018-04-18 DIAGNOSIS — S72145D Nondisplaced intertrochanteric fracture of left femur, subsequent encounter for closed fracture with routine healing: Secondary | ICD-10-CM | POA: Diagnosis not present

## 2018-04-18 DIAGNOSIS — S42192D Fracture of other part of scapula, left shoulder, subsequent encounter for fracture with routine healing: Secondary | ICD-10-CM | POA: Diagnosis not present

## 2018-04-19 DIAGNOSIS — M069 Rheumatoid arthritis, unspecified: Secondary | ICD-10-CM | POA: Diagnosis not present

## 2018-04-19 DIAGNOSIS — Z720 Tobacco use: Secondary | ICD-10-CM | POA: Diagnosis not present

## 2018-04-19 DIAGNOSIS — S42192D Fracture of other part of scapula, left shoulder, subsequent encounter for fracture with routine healing: Secondary | ICD-10-CM | POA: Diagnosis not present

## 2018-04-19 DIAGNOSIS — Z7409 Other reduced mobility: Secondary | ICD-10-CM | POA: Diagnosis not present

## 2018-04-19 DIAGNOSIS — S42412D Displaced simple supracondylar fracture without intercondylar fracture of left humerus, subsequent encounter for fracture with routine healing: Secondary | ICD-10-CM | POA: Diagnosis not present

## 2018-04-19 DIAGNOSIS — E43 Unspecified severe protein-calorie malnutrition: Secondary | ICD-10-CM | POA: Diagnosis not present

## 2018-04-19 DIAGNOSIS — E785 Hyperlipidemia, unspecified: Secondary | ICD-10-CM | POA: Diagnosis not present

## 2018-04-19 DIAGNOSIS — I129 Hypertensive chronic kidney disease with stage 1 through stage 4 chronic kidney disease, or unspecified chronic kidney disease: Secondary | ICD-10-CM | POA: Diagnosis not present

## 2018-04-19 DIAGNOSIS — I10 Essential (primary) hypertension: Secondary | ICD-10-CM | POA: Diagnosis not present

## 2018-04-19 DIAGNOSIS — D62 Acute posthemorrhagic anemia: Secondary | ICD-10-CM | POA: Diagnosis not present

## 2018-04-19 DIAGNOSIS — K5900 Constipation, unspecified: Secondary | ICD-10-CM | POA: Diagnosis not present

## 2018-04-19 DIAGNOSIS — E871 Hypo-osmolality and hyponatremia: Secondary | ICD-10-CM | POA: Diagnosis not present

## 2018-04-19 DIAGNOSIS — S42102D Fracture of unspecified part of scapula, left shoulder, subsequent encounter for fracture with routine healing: Secondary | ICD-10-CM | POA: Diagnosis not present

## 2018-04-19 DIAGNOSIS — S72145D Nondisplaced intertrochanteric fracture of left femur, subsequent encounter for closed fracture with routine healing: Secondary | ICD-10-CM | POA: Diagnosis not present

## 2018-04-19 DIAGNOSIS — S72142D Displaced intertrochanteric fracture of left femur, subsequent encounter for closed fracture with routine healing: Secondary | ICD-10-CM | POA: Diagnosis not present

## 2018-04-20 DIAGNOSIS — M069 Rheumatoid arthritis, unspecified: Secondary | ICD-10-CM | POA: Diagnosis not present

## 2018-04-20 DIAGNOSIS — S42192D Fracture of other part of scapula, left shoulder, subsequent encounter for fracture with routine healing: Secondary | ICD-10-CM | POA: Diagnosis not present

## 2018-04-20 DIAGNOSIS — E871 Hypo-osmolality and hyponatremia: Secondary | ICD-10-CM | POA: Diagnosis not present

## 2018-04-20 DIAGNOSIS — I10 Essential (primary) hypertension: Secondary | ICD-10-CM | POA: Diagnosis not present

## 2018-04-20 DIAGNOSIS — E785 Hyperlipidemia, unspecified: Secondary | ICD-10-CM | POA: Diagnosis not present

## 2018-04-20 DIAGNOSIS — S72142D Displaced intertrochanteric fracture of left femur, subsequent encounter for closed fracture with routine healing: Secondary | ICD-10-CM | POA: Diagnosis not present

## 2018-04-20 DIAGNOSIS — E43 Unspecified severe protein-calorie malnutrition: Secondary | ICD-10-CM | POA: Diagnosis not present

## 2018-04-20 DIAGNOSIS — Z7409 Other reduced mobility: Secondary | ICD-10-CM | POA: Diagnosis not present

## 2018-04-20 DIAGNOSIS — S42412D Displaced simple supracondylar fracture without intercondylar fracture of left humerus, subsequent encounter for fracture with routine healing: Secondary | ICD-10-CM | POA: Diagnosis not present

## 2018-04-20 DIAGNOSIS — K5900 Constipation, unspecified: Secondary | ICD-10-CM | POA: Diagnosis not present

## 2018-04-20 DIAGNOSIS — I129 Hypertensive chronic kidney disease with stage 1 through stage 4 chronic kidney disease, or unspecified chronic kidney disease: Secondary | ICD-10-CM | POA: Diagnosis not present

## 2018-04-20 DIAGNOSIS — S72145D Nondisplaced intertrochanteric fracture of left femur, subsequent encounter for closed fracture with routine healing: Secondary | ICD-10-CM | POA: Diagnosis not present

## 2018-04-20 DIAGNOSIS — D62 Acute posthemorrhagic anemia: Secondary | ICD-10-CM | POA: Diagnosis not present

## 2018-04-20 DIAGNOSIS — S42102D Fracture of unspecified part of scapula, left shoulder, subsequent encounter for fracture with routine healing: Secondary | ICD-10-CM | POA: Diagnosis not present

## 2018-04-21 DIAGNOSIS — M069 Rheumatoid arthritis, unspecified: Secondary | ICD-10-CM | POA: Diagnosis not present

## 2018-04-21 DIAGNOSIS — F17210 Nicotine dependence, cigarettes, uncomplicated: Secondary | ICD-10-CM | POA: Diagnosis not present

## 2018-04-21 DIAGNOSIS — E785 Hyperlipidemia, unspecified: Secondary | ICD-10-CM | POA: Diagnosis not present

## 2018-04-21 DIAGNOSIS — S42192D Fracture of other part of scapula, left shoulder, subsequent encounter for fracture with routine healing: Secondary | ICD-10-CM | POA: Diagnosis not present

## 2018-04-21 DIAGNOSIS — S72142A Displaced intertrochanteric fracture of left femur, initial encounter for closed fracture: Secondary | ICD-10-CM | POA: Diagnosis not present

## 2018-04-21 DIAGNOSIS — S42412D Displaced simple supracondylar fracture without intercondylar fracture of left humerus, subsequent encounter for fracture with routine healing: Secondary | ICD-10-CM | POA: Diagnosis not present

## 2018-04-21 DIAGNOSIS — E871 Hypo-osmolality and hyponatremia: Secondary | ICD-10-CM | POA: Diagnosis not present

## 2018-04-21 DIAGNOSIS — S42102A Fracture of unspecified part of scapula, left shoulder, initial encounter for closed fracture: Secondary | ICD-10-CM | POA: Diagnosis not present

## 2018-04-21 DIAGNOSIS — S42412A Displaced simple supracondylar fracture without intercondylar fracture of left humerus, initial encounter for closed fracture: Secondary | ICD-10-CM | POA: Diagnosis not present

## 2018-04-21 DIAGNOSIS — I10 Essential (primary) hypertension: Secondary | ICD-10-CM | POA: Diagnosis not present

## 2018-04-21 DIAGNOSIS — I129 Hypertensive chronic kidney disease with stage 1 through stage 4 chronic kidney disease, or unspecified chronic kidney disease: Secondary | ICD-10-CM | POA: Diagnosis not present

## 2018-04-21 DIAGNOSIS — D62 Acute posthemorrhagic anemia: Secondary | ICD-10-CM | POA: Diagnosis not present

## 2018-04-21 DIAGNOSIS — E43 Unspecified severe protein-calorie malnutrition: Secondary | ICD-10-CM | POA: Diagnosis not present

## 2018-04-21 DIAGNOSIS — R2689 Other abnormalities of gait and mobility: Secondary | ICD-10-CM | POA: Diagnosis not present

## 2018-04-21 DIAGNOSIS — S72142D Displaced intertrochanteric fracture of left femur, subsequent encounter for closed fracture with routine healing: Secondary | ICD-10-CM | POA: Diagnosis not present

## 2018-04-22 DIAGNOSIS — S42102D Fracture of unspecified part of scapula, left shoulder, subsequent encounter for fracture with routine healing: Secondary | ICD-10-CM | POA: Diagnosis not present

## 2018-04-22 DIAGNOSIS — D62 Acute posthemorrhagic anemia: Secondary | ICD-10-CM | POA: Diagnosis not present

## 2018-04-22 DIAGNOSIS — I10 Essential (primary) hypertension: Secondary | ICD-10-CM | POA: Diagnosis not present

## 2018-04-22 DIAGNOSIS — E785 Hyperlipidemia, unspecified: Secondary | ICD-10-CM | POA: Diagnosis not present

## 2018-04-22 DIAGNOSIS — M069 Rheumatoid arthritis, unspecified: Secondary | ICD-10-CM | POA: Diagnosis not present

## 2018-04-22 DIAGNOSIS — I129 Hypertensive chronic kidney disease with stage 1 through stage 4 chronic kidney disease, or unspecified chronic kidney disease: Secondary | ICD-10-CM | POA: Diagnosis not present

## 2018-04-22 DIAGNOSIS — S72145D Nondisplaced intertrochanteric fracture of left femur, subsequent encounter for closed fracture with routine healing: Secondary | ICD-10-CM | POA: Diagnosis not present

## 2018-04-22 DIAGNOSIS — S42192D Fracture of other part of scapula, left shoulder, subsequent encounter for fracture with routine healing: Secondary | ICD-10-CM | POA: Diagnosis not present

## 2018-04-22 DIAGNOSIS — Z7409 Other reduced mobility: Secondary | ICD-10-CM | POA: Diagnosis not present

## 2018-04-22 DIAGNOSIS — S42412D Displaced simple supracondylar fracture without intercondylar fracture of left humerus, subsequent encounter for fracture with routine healing: Secondary | ICD-10-CM | POA: Diagnosis not present

## 2018-04-22 DIAGNOSIS — E43 Unspecified severe protein-calorie malnutrition: Secondary | ICD-10-CM | POA: Diagnosis not present

## 2018-04-22 DIAGNOSIS — E871 Hypo-osmolality and hyponatremia: Secondary | ICD-10-CM | POA: Diagnosis not present

## 2018-04-22 DIAGNOSIS — S72142D Displaced intertrochanteric fracture of left femur, subsequent encounter for closed fracture with routine healing: Secondary | ICD-10-CM | POA: Diagnosis not present

## 2018-04-22 DIAGNOSIS — K5900 Constipation, unspecified: Secondary | ICD-10-CM | POA: Diagnosis not present

## 2018-04-23 DIAGNOSIS — E871 Hypo-osmolality and hyponatremia: Secondary | ICD-10-CM | POA: Diagnosis not present

## 2018-04-23 DIAGNOSIS — F17210 Nicotine dependence, cigarettes, uncomplicated: Secondary | ICD-10-CM | POA: Diagnosis not present

## 2018-04-23 DIAGNOSIS — M069 Rheumatoid arthritis, unspecified: Secondary | ICD-10-CM | POA: Diagnosis not present

## 2018-04-23 DIAGNOSIS — D62 Acute posthemorrhagic anemia: Secondary | ICD-10-CM | POA: Diagnosis not present

## 2018-04-23 DIAGNOSIS — S72142D Displaced intertrochanteric fracture of left femur, subsequent encounter for closed fracture with routine healing: Secondary | ICD-10-CM | POA: Diagnosis not present

## 2018-04-23 DIAGNOSIS — R2689 Other abnormalities of gait and mobility: Secondary | ICD-10-CM | POA: Diagnosis not present

## 2018-04-23 DIAGNOSIS — S72142A Displaced intertrochanteric fracture of left femur, initial encounter for closed fracture: Secondary | ICD-10-CM | POA: Diagnosis not present

## 2018-04-23 DIAGNOSIS — S42102A Fracture of unspecified part of scapula, left shoulder, initial encounter for closed fracture: Secondary | ICD-10-CM | POA: Diagnosis not present

## 2018-04-23 DIAGNOSIS — S42192D Fracture of other part of scapula, left shoulder, subsequent encounter for fracture with routine healing: Secondary | ICD-10-CM | POA: Diagnosis not present

## 2018-04-23 DIAGNOSIS — S42412A Displaced simple supracondylar fracture without intercondylar fracture of left humerus, initial encounter for closed fracture: Secondary | ICD-10-CM | POA: Diagnosis not present

## 2018-04-23 DIAGNOSIS — S42412D Displaced simple supracondylar fracture without intercondylar fracture of left humerus, subsequent encounter for fracture with routine healing: Secondary | ICD-10-CM | POA: Diagnosis not present

## 2018-04-23 DIAGNOSIS — E785 Hyperlipidemia, unspecified: Secondary | ICD-10-CM | POA: Diagnosis not present

## 2018-04-23 DIAGNOSIS — E43 Unspecified severe protein-calorie malnutrition: Secondary | ICD-10-CM | POA: Diagnosis not present

## 2018-04-23 DIAGNOSIS — I10 Essential (primary) hypertension: Secondary | ICD-10-CM | POA: Diagnosis not present

## 2018-04-23 DIAGNOSIS — I129 Hypertensive chronic kidney disease with stage 1 through stage 4 chronic kidney disease, or unspecified chronic kidney disease: Secondary | ICD-10-CM | POA: Diagnosis not present

## 2018-04-24 DIAGNOSIS — F17210 Nicotine dependence, cigarettes, uncomplicated: Secondary | ICD-10-CM | POA: Diagnosis not present

## 2018-04-24 DIAGNOSIS — E785 Hyperlipidemia, unspecified: Secondary | ICD-10-CM | POA: Diagnosis not present

## 2018-04-24 DIAGNOSIS — S42192D Fracture of other part of scapula, left shoulder, subsequent encounter for fracture with routine healing: Secondary | ICD-10-CM | POA: Diagnosis not present

## 2018-04-24 DIAGNOSIS — S72142A Displaced intertrochanteric fracture of left femur, initial encounter for closed fracture: Secondary | ICD-10-CM | POA: Diagnosis not present

## 2018-04-24 DIAGNOSIS — M069 Rheumatoid arthritis, unspecified: Secondary | ICD-10-CM | POA: Diagnosis not present

## 2018-04-24 DIAGNOSIS — I129 Hypertensive chronic kidney disease with stage 1 through stage 4 chronic kidney disease, or unspecified chronic kidney disease: Secondary | ICD-10-CM | POA: Diagnosis not present

## 2018-04-24 DIAGNOSIS — I10 Essential (primary) hypertension: Secondary | ICD-10-CM | POA: Diagnosis not present

## 2018-04-24 DIAGNOSIS — S42412D Displaced simple supracondylar fracture without intercondylar fracture of left humerus, subsequent encounter for fracture with routine healing: Secondary | ICD-10-CM | POA: Diagnosis not present

## 2018-04-24 DIAGNOSIS — E43 Unspecified severe protein-calorie malnutrition: Secondary | ICD-10-CM | POA: Diagnosis not present

## 2018-04-24 DIAGNOSIS — R2689 Other abnormalities of gait and mobility: Secondary | ICD-10-CM | POA: Diagnosis not present

## 2018-04-24 DIAGNOSIS — S72142D Displaced intertrochanteric fracture of left femur, subsequent encounter for closed fracture with routine healing: Secondary | ICD-10-CM | POA: Diagnosis not present

## 2018-04-24 DIAGNOSIS — D62 Acute posthemorrhagic anemia: Secondary | ICD-10-CM | POA: Diagnosis not present

## 2018-04-24 DIAGNOSIS — S42412A Displaced simple supracondylar fracture without intercondylar fracture of left humerus, initial encounter for closed fracture: Secondary | ICD-10-CM | POA: Diagnosis not present

## 2018-04-24 DIAGNOSIS — E871 Hypo-osmolality and hyponatremia: Secondary | ICD-10-CM | POA: Diagnosis not present

## 2018-04-24 DIAGNOSIS — S42102A Fracture of unspecified part of scapula, left shoulder, initial encounter for closed fracture: Secondary | ICD-10-CM | POA: Diagnosis not present

## 2018-04-25 DIAGNOSIS — D62 Acute posthemorrhagic anemia: Secondary | ICD-10-CM | POA: Diagnosis not present

## 2018-04-25 DIAGNOSIS — I129 Hypertensive chronic kidney disease with stage 1 through stage 4 chronic kidney disease, or unspecified chronic kidney disease: Secondary | ICD-10-CM | POA: Diagnosis not present

## 2018-04-25 DIAGNOSIS — S42192D Fracture of other part of scapula, left shoulder, subsequent encounter for fracture with routine healing: Secondary | ICD-10-CM | POA: Diagnosis not present

## 2018-04-25 DIAGNOSIS — Z7409 Other reduced mobility: Secondary | ICD-10-CM | POA: Diagnosis not present

## 2018-04-25 DIAGNOSIS — E43 Unspecified severe protein-calorie malnutrition: Secondary | ICD-10-CM | POA: Diagnosis not present

## 2018-04-25 DIAGNOSIS — S42412D Displaced simple supracondylar fracture without intercondylar fracture of left humerus, subsequent encounter for fracture with routine healing: Secondary | ICD-10-CM | POA: Diagnosis not present

## 2018-04-25 DIAGNOSIS — Z720 Tobacco use: Secondary | ICD-10-CM | POA: Diagnosis not present

## 2018-04-25 DIAGNOSIS — S72142D Displaced intertrochanteric fracture of left femur, subsequent encounter for closed fracture with routine healing: Secondary | ICD-10-CM | POA: Diagnosis not present

## 2018-04-25 DIAGNOSIS — S72145D Nondisplaced intertrochanteric fracture of left femur, subsequent encounter for closed fracture with routine healing: Secondary | ICD-10-CM | POA: Diagnosis not present

## 2018-04-25 DIAGNOSIS — S42102D Fracture of unspecified part of scapula, left shoulder, subsequent encounter for fracture with routine healing: Secondary | ICD-10-CM | POA: Diagnosis not present

## 2018-04-25 DIAGNOSIS — K5900 Constipation, unspecified: Secondary | ICD-10-CM | POA: Diagnosis not present

## 2018-04-25 DIAGNOSIS — E785 Hyperlipidemia, unspecified: Secondary | ICD-10-CM | POA: Diagnosis not present

## 2018-04-25 DIAGNOSIS — I10 Essential (primary) hypertension: Secondary | ICD-10-CM | POA: Diagnosis not present

## 2018-04-25 DIAGNOSIS — E871 Hypo-osmolality and hyponatremia: Secondary | ICD-10-CM | POA: Diagnosis not present

## 2018-04-25 DIAGNOSIS — M069 Rheumatoid arthritis, unspecified: Secondary | ICD-10-CM | POA: Diagnosis not present

## 2018-04-26 DIAGNOSIS — S72142D Displaced intertrochanteric fracture of left femur, subsequent encounter for closed fracture with routine healing: Secondary | ICD-10-CM | POA: Diagnosis not present

## 2018-04-26 DIAGNOSIS — S72145D Nondisplaced intertrochanteric fracture of left femur, subsequent encounter for closed fracture with routine healing: Secondary | ICD-10-CM | POA: Diagnosis not present

## 2018-04-26 DIAGNOSIS — Z720 Tobacco use: Secondary | ICD-10-CM | POA: Diagnosis not present

## 2018-04-26 DIAGNOSIS — S42192D Fracture of other part of scapula, left shoulder, subsequent encounter for fracture with routine healing: Secondary | ICD-10-CM | POA: Diagnosis not present

## 2018-04-26 DIAGNOSIS — K5900 Constipation, unspecified: Secondary | ICD-10-CM | POA: Diagnosis not present

## 2018-04-26 DIAGNOSIS — Z7409 Other reduced mobility: Secondary | ICD-10-CM | POA: Diagnosis not present

## 2018-04-26 DIAGNOSIS — M069 Rheumatoid arthritis, unspecified: Secondary | ICD-10-CM | POA: Diagnosis not present

## 2018-04-26 DIAGNOSIS — S42102D Fracture of unspecified part of scapula, left shoulder, subsequent encounter for fracture with routine healing: Secondary | ICD-10-CM | POA: Diagnosis not present

## 2018-04-26 DIAGNOSIS — E871 Hypo-osmolality and hyponatremia: Secondary | ICD-10-CM | POA: Diagnosis not present

## 2018-04-26 DIAGNOSIS — E43 Unspecified severe protein-calorie malnutrition: Secondary | ICD-10-CM | POA: Diagnosis not present

## 2018-04-26 DIAGNOSIS — E785 Hyperlipidemia, unspecified: Secondary | ICD-10-CM | POA: Diagnosis not present

## 2018-04-26 DIAGNOSIS — S42412D Displaced simple supracondylar fracture without intercondylar fracture of left humerus, subsequent encounter for fracture with routine healing: Secondary | ICD-10-CM | POA: Diagnosis not present

## 2018-04-26 DIAGNOSIS — I129 Hypertensive chronic kidney disease with stage 1 through stage 4 chronic kidney disease, or unspecified chronic kidney disease: Secondary | ICD-10-CM | POA: Diagnosis not present

## 2018-04-26 DIAGNOSIS — I10 Essential (primary) hypertension: Secondary | ICD-10-CM | POA: Diagnosis not present

## 2018-04-26 DIAGNOSIS — D62 Acute posthemorrhagic anemia: Secondary | ICD-10-CM | POA: Diagnosis not present

## 2018-04-27 DIAGNOSIS — M069 Rheumatoid arthritis, unspecified: Secondary | ICD-10-CM | POA: Diagnosis not present

## 2018-04-27 DIAGNOSIS — I129 Hypertensive chronic kidney disease with stage 1 through stage 4 chronic kidney disease, or unspecified chronic kidney disease: Secondary | ICD-10-CM | POA: Diagnosis not present

## 2018-04-27 DIAGNOSIS — G4700 Insomnia, unspecified: Secondary | ICD-10-CM | POA: Diagnosis not present

## 2018-04-27 DIAGNOSIS — S72142D Displaced intertrochanteric fracture of left femur, subsequent encounter for closed fracture with routine healing: Secondary | ICD-10-CM | POA: Diagnosis not present

## 2018-04-27 DIAGNOSIS — D62 Acute posthemorrhagic anemia: Secondary | ICD-10-CM | POA: Diagnosis not present

## 2018-04-27 DIAGNOSIS — S42192D Fracture of other part of scapula, left shoulder, subsequent encounter for fracture with routine healing: Secondary | ICD-10-CM | POA: Diagnosis not present

## 2018-04-27 DIAGNOSIS — Z720 Tobacco use: Secondary | ICD-10-CM | POA: Diagnosis not present

## 2018-04-27 DIAGNOSIS — S72145D Nondisplaced intertrochanteric fracture of left femur, subsequent encounter for closed fracture with routine healing: Secondary | ICD-10-CM | POA: Diagnosis not present

## 2018-04-27 DIAGNOSIS — K5900 Constipation, unspecified: Secondary | ICD-10-CM | POA: Diagnosis not present

## 2018-04-27 DIAGNOSIS — S42102D Fracture of unspecified part of scapula, left shoulder, subsequent encounter for fracture with routine healing: Secondary | ICD-10-CM | POA: Diagnosis not present

## 2018-04-27 DIAGNOSIS — E785 Hyperlipidemia, unspecified: Secondary | ICD-10-CM | POA: Diagnosis not present

## 2018-04-27 DIAGNOSIS — E43 Unspecified severe protein-calorie malnutrition: Secondary | ICD-10-CM | POA: Diagnosis not present

## 2018-04-27 DIAGNOSIS — E871 Hypo-osmolality and hyponatremia: Secondary | ICD-10-CM | POA: Diagnosis not present

## 2018-04-27 DIAGNOSIS — I10 Essential (primary) hypertension: Secondary | ICD-10-CM | POA: Diagnosis not present

## 2018-04-27 DIAGNOSIS — S42412D Displaced simple supracondylar fracture without intercondylar fracture of left humerus, subsequent encounter for fracture with routine healing: Secondary | ICD-10-CM | POA: Diagnosis not present

## 2018-04-28 DIAGNOSIS — G4700 Insomnia, unspecified: Secondary | ICD-10-CM | POA: Diagnosis not present

## 2018-04-28 DIAGNOSIS — S72142D Displaced intertrochanteric fracture of left femur, subsequent encounter for closed fracture with routine healing: Secondary | ICD-10-CM | POA: Diagnosis not present

## 2018-04-28 DIAGNOSIS — S42102D Fracture of unspecified part of scapula, left shoulder, subsequent encounter for fracture with routine healing: Secondary | ICD-10-CM | POA: Diagnosis not present

## 2018-04-28 DIAGNOSIS — R05 Cough: Secondary | ICD-10-CM | POA: Diagnosis not present

## 2018-04-28 DIAGNOSIS — M069 Rheumatoid arthritis, unspecified: Secondary | ICD-10-CM | POA: Diagnosis not present

## 2018-04-28 DIAGNOSIS — S42192D Fracture of other part of scapula, left shoulder, subsequent encounter for fracture with routine healing: Secondary | ICD-10-CM | POA: Diagnosis not present

## 2018-04-28 DIAGNOSIS — Z720 Tobacco use: Secondary | ICD-10-CM | POA: Diagnosis not present

## 2018-04-28 DIAGNOSIS — E871 Hypo-osmolality and hyponatremia: Secondary | ICD-10-CM | POA: Diagnosis not present

## 2018-04-28 DIAGNOSIS — S42412D Displaced simple supracondylar fracture without intercondylar fracture of left humerus, subsequent encounter for fracture with routine healing: Secondary | ICD-10-CM | POA: Diagnosis not present

## 2018-04-28 DIAGNOSIS — D62 Acute posthemorrhagic anemia: Secondary | ICD-10-CM | POA: Diagnosis not present

## 2018-04-28 DIAGNOSIS — K5900 Constipation, unspecified: Secondary | ICD-10-CM | POA: Diagnosis not present

## 2018-04-28 DIAGNOSIS — I129 Hypertensive chronic kidney disease with stage 1 through stage 4 chronic kidney disease, or unspecified chronic kidney disease: Secondary | ICD-10-CM | POA: Diagnosis not present

## 2018-04-28 DIAGNOSIS — E785 Hyperlipidemia, unspecified: Secondary | ICD-10-CM | POA: Diagnosis not present

## 2018-04-28 DIAGNOSIS — I10 Essential (primary) hypertension: Secondary | ICD-10-CM | POA: Diagnosis not present

## 2018-04-28 DIAGNOSIS — E43 Unspecified severe protein-calorie malnutrition: Secondary | ICD-10-CM | POA: Diagnosis not present

## 2018-04-28 DIAGNOSIS — S72145D Nondisplaced intertrochanteric fracture of left femur, subsequent encounter for closed fracture with routine healing: Secondary | ICD-10-CM | POA: Diagnosis not present

## 2018-04-29 DIAGNOSIS — K5900 Constipation, unspecified: Secondary | ICD-10-CM | POA: Diagnosis not present

## 2018-04-29 DIAGNOSIS — Z720 Tobacco use: Secondary | ICD-10-CM | POA: Diagnosis not present

## 2018-04-29 DIAGNOSIS — E871 Hypo-osmolality and hyponatremia: Secondary | ICD-10-CM | POA: Diagnosis not present

## 2018-04-29 DIAGNOSIS — I10 Essential (primary) hypertension: Secondary | ICD-10-CM | POA: Diagnosis not present

## 2018-04-29 DIAGNOSIS — E785 Hyperlipidemia, unspecified: Secondary | ICD-10-CM | POA: Diagnosis not present

## 2018-04-29 DIAGNOSIS — S42192D Fracture of other part of scapula, left shoulder, subsequent encounter for fracture with routine healing: Secondary | ICD-10-CM | POA: Diagnosis not present

## 2018-04-29 DIAGNOSIS — S72145D Nondisplaced intertrochanteric fracture of left femur, subsequent encounter for closed fracture with routine healing: Secondary | ICD-10-CM | POA: Diagnosis not present

## 2018-04-29 DIAGNOSIS — M069 Rheumatoid arthritis, unspecified: Secondary | ICD-10-CM | POA: Diagnosis not present

## 2018-04-29 DIAGNOSIS — E43 Unspecified severe protein-calorie malnutrition: Secondary | ICD-10-CM | POA: Diagnosis not present

## 2018-04-29 DIAGNOSIS — I129 Hypertensive chronic kidney disease with stage 1 through stage 4 chronic kidney disease, or unspecified chronic kidney disease: Secondary | ICD-10-CM | POA: Diagnosis not present

## 2018-04-29 DIAGNOSIS — D62 Acute posthemorrhagic anemia: Secondary | ICD-10-CM | POA: Diagnosis not present

## 2018-04-29 DIAGNOSIS — R079 Chest pain, unspecified: Secondary | ICD-10-CM | POA: Diagnosis not present

## 2018-04-29 DIAGNOSIS — G4700 Insomnia, unspecified: Secondary | ICD-10-CM | POA: Diagnosis not present

## 2018-04-29 DIAGNOSIS — S42412D Displaced simple supracondylar fracture without intercondylar fracture of left humerus, subsequent encounter for fracture with routine healing: Secondary | ICD-10-CM | POA: Diagnosis not present

## 2018-04-29 DIAGNOSIS — S42102D Fracture of unspecified part of scapula, left shoulder, subsequent encounter for fracture with routine healing: Secondary | ICD-10-CM | POA: Diagnosis not present

## 2018-04-29 DIAGNOSIS — S72142D Displaced intertrochanteric fracture of left femur, subsequent encounter for closed fracture with routine healing: Secondary | ICD-10-CM | POA: Diagnosis not present

## 2018-04-30 DIAGNOSIS — S72142A Displaced intertrochanteric fracture of left femur, initial encounter for closed fracture: Secondary | ICD-10-CM | POA: Diagnosis not present

## 2018-04-30 DIAGNOSIS — G4700 Insomnia, unspecified: Secondary | ICD-10-CM | POA: Diagnosis not present

## 2018-04-30 DIAGNOSIS — I129 Hypertensive chronic kidney disease with stage 1 through stage 4 chronic kidney disease, or unspecified chronic kidney disease: Secondary | ICD-10-CM | POA: Diagnosis not present

## 2018-04-30 DIAGNOSIS — S42102A Fracture of unspecified part of scapula, left shoulder, initial encounter for closed fracture: Secondary | ICD-10-CM | POA: Diagnosis not present

## 2018-04-30 DIAGNOSIS — G8929 Other chronic pain: Secondary | ICD-10-CM | POA: Diagnosis not present

## 2018-04-30 DIAGNOSIS — E43 Unspecified severe protein-calorie malnutrition: Secondary | ICD-10-CM | POA: Diagnosis not present

## 2018-04-30 DIAGNOSIS — S42412D Displaced simple supracondylar fracture without intercondylar fracture of left humerus, subsequent encounter for fracture with routine healing: Secondary | ICD-10-CM | POA: Diagnosis not present

## 2018-04-30 DIAGNOSIS — E871 Hypo-osmolality and hyponatremia: Secondary | ICD-10-CM | POA: Diagnosis not present

## 2018-04-30 DIAGNOSIS — E785 Hyperlipidemia, unspecified: Secondary | ICD-10-CM | POA: Diagnosis not present

## 2018-04-30 DIAGNOSIS — I10 Essential (primary) hypertension: Secondary | ICD-10-CM | POA: Diagnosis not present

## 2018-04-30 DIAGNOSIS — R5381 Other malaise: Secondary | ICD-10-CM | POA: Diagnosis not present

## 2018-04-30 DIAGNOSIS — S42412A Displaced simple supracondylar fracture without intercondylar fracture of left humerus, initial encounter for closed fracture: Secondary | ICD-10-CM | POA: Diagnosis not present

## 2018-04-30 DIAGNOSIS — K5900 Constipation, unspecified: Secondary | ICD-10-CM | POA: Diagnosis not present

## 2018-04-30 DIAGNOSIS — S42192D Fracture of other part of scapula, left shoulder, subsequent encounter for fracture with routine healing: Secondary | ICD-10-CM | POA: Diagnosis not present

## 2018-04-30 DIAGNOSIS — S72142D Displaced intertrochanteric fracture of left femur, subsequent encounter for closed fracture with routine healing: Secondary | ICD-10-CM | POA: Diagnosis not present

## 2018-04-30 DIAGNOSIS — D62 Acute posthemorrhagic anemia: Secondary | ICD-10-CM | POA: Diagnosis not present

## 2018-05-01 DIAGNOSIS — E43 Unspecified severe protein-calorie malnutrition: Secondary | ICD-10-CM | POA: Diagnosis not present

## 2018-05-01 DIAGNOSIS — G8929 Other chronic pain: Secondary | ICD-10-CM | POA: Diagnosis not present

## 2018-05-01 DIAGNOSIS — S42412A Displaced simple supracondylar fracture without intercondylar fracture of left humerus, initial encounter for closed fracture: Secondary | ICD-10-CM | POA: Diagnosis not present

## 2018-05-01 DIAGNOSIS — S42192D Fracture of other part of scapula, left shoulder, subsequent encounter for fracture with routine healing: Secondary | ICD-10-CM | POA: Diagnosis not present

## 2018-05-01 DIAGNOSIS — K5900 Constipation, unspecified: Secondary | ICD-10-CM | POA: Diagnosis not present

## 2018-05-01 DIAGNOSIS — G4700 Insomnia, unspecified: Secondary | ICD-10-CM | POA: Diagnosis not present

## 2018-05-01 DIAGNOSIS — R5381 Other malaise: Secondary | ICD-10-CM | POA: Diagnosis not present

## 2018-05-01 DIAGNOSIS — S42102A Fracture of unspecified part of scapula, left shoulder, initial encounter for closed fracture: Secondary | ICD-10-CM | POA: Diagnosis not present

## 2018-05-01 DIAGNOSIS — D62 Acute posthemorrhagic anemia: Secondary | ICD-10-CM | POA: Diagnosis not present

## 2018-05-01 DIAGNOSIS — I10 Essential (primary) hypertension: Secondary | ICD-10-CM | POA: Diagnosis not present

## 2018-05-01 DIAGNOSIS — I129 Hypertensive chronic kidney disease with stage 1 through stage 4 chronic kidney disease, or unspecified chronic kidney disease: Secondary | ICD-10-CM | POA: Diagnosis not present

## 2018-05-01 DIAGNOSIS — E785 Hyperlipidemia, unspecified: Secondary | ICD-10-CM | POA: Diagnosis not present

## 2018-05-01 DIAGNOSIS — S42412D Displaced simple supracondylar fracture without intercondylar fracture of left humerus, subsequent encounter for fracture with routine healing: Secondary | ICD-10-CM | POA: Diagnosis not present

## 2018-05-01 DIAGNOSIS — S72142D Displaced intertrochanteric fracture of left femur, subsequent encounter for closed fracture with routine healing: Secondary | ICD-10-CM | POA: Diagnosis not present

## 2018-05-01 DIAGNOSIS — E871 Hypo-osmolality and hyponatremia: Secondary | ICD-10-CM | POA: Diagnosis not present

## 2018-05-01 DIAGNOSIS — S72142A Displaced intertrochanteric fracture of left femur, initial encounter for closed fracture: Secondary | ICD-10-CM | POA: Diagnosis not present

## 2018-05-02 DIAGNOSIS — E871 Hypo-osmolality and hyponatremia: Secondary | ICD-10-CM | POA: Diagnosis not present

## 2018-05-02 DIAGNOSIS — I10 Essential (primary) hypertension: Secondary | ICD-10-CM | POA: Diagnosis not present

## 2018-05-02 DIAGNOSIS — K5900 Constipation, unspecified: Secondary | ICD-10-CM | POA: Diagnosis not present

## 2018-05-02 DIAGNOSIS — S42192D Fracture of other part of scapula, left shoulder, subsequent encounter for fracture with routine healing: Secondary | ICD-10-CM | POA: Diagnosis not present

## 2018-05-02 DIAGNOSIS — D62 Acute posthemorrhagic anemia: Secondary | ICD-10-CM | POA: Diagnosis not present

## 2018-05-02 DIAGNOSIS — E785 Hyperlipidemia, unspecified: Secondary | ICD-10-CM | POA: Diagnosis not present

## 2018-05-02 DIAGNOSIS — S42412D Displaced simple supracondylar fracture without intercondylar fracture of left humerus, subsequent encounter for fracture with routine healing: Secondary | ICD-10-CM | POA: Diagnosis not present

## 2018-05-02 DIAGNOSIS — S72142D Displaced intertrochanteric fracture of left femur, subsequent encounter for closed fracture with routine healing: Secondary | ICD-10-CM | POA: Diagnosis not present

## 2018-05-02 DIAGNOSIS — Z720 Tobacco use: Secondary | ICD-10-CM | POA: Diagnosis not present

## 2018-05-02 DIAGNOSIS — S72145D Nondisplaced intertrochanteric fracture of left femur, subsequent encounter for closed fracture with routine healing: Secondary | ICD-10-CM | POA: Diagnosis not present

## 2018-05-02 DIAGNOSIS — E43 Unspecified severe protein-calorie malnutrition: Secondary | ICD-10-CM | POA: Diagnosis not present

## 2018-05-02 DIAGNOSIS — I129 Hypertensive chronic kidney disease with stage 1 through stage 4 chronic kidney disease, or unspecified chronic kidney disease: Secondary | ICD-10-CM | POA: Diagnosis not present

## 2018-05-02 DIAGNOSIS — G4700 Insomnia, unspecified: Secondary | ICD-10-CM | POA: Diagnosis not present

## 2018-05-02 DIAGNOSIS — S42102D Fracture of unspecified part of scapula, left shoulder, subsequent encounter for fracture with routine healing: Secondary | ICD-10-CM | POA: Diagnosis not present

## 2018-05-02 DIAGNOSIS — M069 Rheumatoid arthritis, unspecified: Secondary | ICD-10-CM | POA: Diagnosis not present

## 2018-05-03 DIAGNOSIS — I10 Essential (primary) hypertension: Secondary | ICD-10-CM | POA: Diagnosis not present

## 2018-05-03 DIAGNOSIS — S42102D Fracture of unspecified part of scapula, left shoulder, subsequent encounter for fracture with routine healing: Secondary | ICD-10-CM | POA: Diagnosis not present

## 2018-05-03 DIAGNOSIS — E785 Hyperlipidemia, unspecified: Secondary | ICD-10-CM | POA: Diagnosis not present

## 2018-05-03 DIAGNOSIS — S42412D Displaced simple supracondylar fracture without intercondylar fracture of left humerus, subsequent encounter for fracture with routine healing: Secondary | ICD-10-CM | POA: Diagnosis not present

## 2018-05-03 DIAGNOSIS — Z720 Tobacco use: Secondary | ICD-10-CM | POA: Diagnosis not present

## 2018-05-03 DIAGNOSIS — S72142D Displaced intertrochanteric fracture of left femur, subsequent encounter for closed fracture with routine healing: Secondary | ICD-10-CM | POA: Diagnosis not present

## 2018-05-03 DIAGNOSIS — K5900 Constipation, unspecified: Secondary | ICD-10-CM | POA: Diagnosis not present

## 2018-05-03 DIAGNOSIS — S42192D Fracture of other part of scapula, left shoulder, subsequent encounter for fracture with routine healing: Secondary | ICD-10-CM | POA: Diagnosis not present

## 2018-05-03 DIAGNOSIS — Z7409 Other reduced mobility: Secondary | ICD-10-CM | POA: Diagnosis not present

## 2018-05-03 DIAGNOSIS — I129 Hypertensive chronic kidney disease with stage 1 through stage 4 chronic kidney disease, or unspecified chronic kidney disease: Secondary | ICD-10-CM | POA: Diagnosis not present

## 2018-05-03 DIAGNOSIS — E871 Hypo-osmolality and hyponatremia: Secondary | ICD-10-CM | POA: Diagnosis not present

## 2018-05-03 DIAGNOSIS — S72145D Nondisplaced intertrochanteric fracture of left femur, subsequent encounter for closed fracture with routine healing: Secondary | ICD-10-CM | POA: Diagnosis not present

## 2018-05-03 DIAGNOSIS — G4700 Insomnia, unspecified: Secondary | ICD-10-CM | POA: Diagnosis not present

## 2018-05-03 DIAGNOSIS — M069 Rheumatoid arthritis, unspecified: Secondary | ICD-10-CM | POA: Diagnosis not present

## 2018-05-03 DIAGNOSIS — E43 Unspecified severe protein-calorie malnutrition: Secondary | ICD-10-CM | POA: Diagnosis not present

## 2018-05-03 DIAGNOSIS — D62 Acute posthemorrhagic anemia: Secondary | ICD-10-CM | POA: Diagnosis not present

## 2018-05-04 DIAGNOSIS — M069 Rheumatoid arthritis, unspecified: Secondary | ICD-10-CM | POA: Diagnosis not present

## 2018-05-04 DIAGNOSIS — S42102D Fracture of unspecified part of scapula, left shoulder, subsequent encounter for fracture with routine healing: Secondary | ICD-10-CM | POA: Diagnosis not present

## 2018-05-04 DIAGNOSIS — S42192D Fracture of other part of scapula, left shoulder, subsequent encounter for fracture with routine healing: Secondary | ICD-10-CM | POA: Diagnosis not present

## 2018-05-04 DIAGNOSIS — I10 Essential (primary) hypertension: Secondary | ICD-10-CM | POA: Diagnosis not present

## 2018-05-04 DIAGNOSIS — E871 Hypo-osmolality and hyponatremia: Secondary | ICD-10-CM | POA: Diagnosis not present

## 2018-05-04 DIAGNOSIS — I129 Hypertensive chronic kidney disease with stage 1 through stage 4 chronic kidney disease, or unspecified chronic kidney disease: Secondary | ICD-10-CM | POA: Diagnosis not present

## 2018-05-04 DIAGNOSIS — E43 Unspecified severe protein-calorie malnutrition: Secondary | ICD-10-CM | POA: Diagnosis not present

## 2018-05-04 DIAGNOSIS — E785 Hyperlipidemia, unspecified: Secondary | ICD-10-CM | POA: Diagnosis not present

## 2018-05-04 DIAGNOSIS — S72145D Nondisplaced intertrochanteric fracture of left femur, subsequent encounter for closed fracture with routine healing: Secondary | ICD-10-CM | POA: Diagnosis not present

## 2018-05-04 DIAGNOSIS — D62 Acute posthemorrhagic anemia: Secondary | ICD-10-CM | POA: Diagnosis not present

## 2018-05-04 DIAGNOSIS — S72142D Displaced intertrochanteric fracture of left femur, subsequent encounter for closed fracture with routine healing: Secondary | ICD-10-CM | POA: Diagnosis not present

## 2018-05-04 DIAGNOSIS — K5900 Constipation, unspecified: Secondary | ICD-10-CM | POA: Diagnosis not present

## 2018-05-04 DIAGNOSIS — S42412D Displaced simple supracondylar fracture without intercondylar fracture of left humerus, subsequent encounter for fracture with routine healing: Secondary | ICD-10-CM | POA: Diagnosis not present

## 2018-05-04 DIAGNOSIS — Z7409 Other reduced mobility: Secondary | ICD-10-CM | POA: Diagnosis not present

## 2018-05-04 DIAGNOSIS — G4700 Insomnia, unspecified: Secondary | ICD-10-CM | POA: Diagnosis not present

## 2018-05-05 DIAGNOSIS — S42412D Displaced simple supracondylar fracture without intercondylar fracture of left humerus, subsequent encounter for fracture with routine healing: Secondary | ICD-10-CM | POA: Diagnosis not present

## 2018-05-05 DIAGNOSIS — M069 Rheumatoid arthritis, unspecified: Secondary | ICD-10-CM | POA: Diagnosis not present

## 2018-05-05 DIAGNOSIS — G4700 Insomnia, unspecified: Secondary | ICD-10-CM | POA: Diagnosis not present

## 2018-05-05 DIAGNOSIS — E43 Unspecified severe protein-calorie malnutrition: Secondary | ICD-10-CM | POA: Diagnosis not present

## 2018-05-05 DIAGNOSIS — S42102D Fracture of unspecified part of scapula, left shoulder, subsequent encounter for fracture with routine healing: Secondary | ICD-10-CM | POA: Diagnosis not present

## 2018-05-05 DIAGNOSIS — Z7409 Other reduced mobility: Secondary | ICD-10-CM | POA: Diagnosis not present

## 2018-05-05 DIAGNOSIS — E785 Hyperlipidemia, unspecified: Secondary | ICD-10-CM | POA: Diagnosis not present

## 2018-05-05 DIAGNOSIS — I129 Hypertensive chronic kidney disease with stage 1 through stage 4 chronic kidney disease, or unspecified chronic kidney disease: Secondary | ICD-10-CM | POA: Diagnosis not present

## 2018-05-05 DIAGNOSIS — K5900 Constipation, unspecified: Secondary | ICD-10-CM | POA: Diagnosis not present

## 2018-05-05 DIAGNOSIS — S72142D Displaced intertrochanteric fracture of left femur, subsequent encounter for closed fracture with routine healing: Secondary | ICD-10-CM | POA: Diagnosis not present

## 2018-05-05 DIAGNOSIS — I10 Essential (primary) hypertension: Secondary | ICD-10-CM | POA: Diagnosis not present

## 2018-05-05 DIAGNOSIS — S72145D Nondisplaced intertrochanteric fracture of left femur, subsequent encounter for closed fracture with routine healing: Secondary | ICD-10-CM | POA: Diagnosis not present

## 2018-05-05 DIAGNOSIS — D62 Acute posthemorrhagic anemia: Secondary | ICD-10-CM | POA: Diagnosis not present

## 2018-05-05 DIAGNOSIS — E871 Hypo-osmolality and hyponatremia: Secondary | ICD-10-CM | POA: Diagnosis not present

## 2018-05-05 DIAGNOSIS — S42192D Fracture of other part of scapula, left shoulder, subsequent encounter for fracture with routine healing: Secondary | ICD-10-CM | POA: Diagnosis not present

## 2018-05-06 DIAGNOSIS — G4700 Insomnia, unspecified: Secondary | ICD-10-CM | POA: Diagnosis not present

## 2018-05-06 DIAGNOSIS — S72145D Nondisplaced intertrochanteric fracture of left femur, subsequent encounter for closed fracture with routine healing: Secondary | ICD-10-CM | POA: Diagnosis not present

## 2018-05-06 DIAGNOSIS — D62 Acute posthemorrhagic anemia: Secondary | ICD-10-CM | POA: Diagnosis not present

## 2018-05-06 DIAGNOSIS — S42192D Fracture of other part of scapula, left shoulder, subsequent encounter for fracture with routine healing: Secondary | ICD-10-CM | POA: Diagnosis not present

## 2018-05-06 DIAGNOSIS — I129 Hypertensive chronic kidney disease with stage 1 through stage 4 chronic kidney disease, or unspecified chronic kidney disease: Secondary | ICD-10-CM | POA: Diagnosis not present

## 2018-05-06 DIAGNOSIS — K5900 Constipation, unspecified: Secondary | ICD-10-CM | POA: Diagnosis not present

## 2018-05-06 DIAGNOSIS — S42102D Fracture of unspecified part of scapula, left shoulder, subsequent encounter for fracture with routine healing: Secondary | ICD-10-CM | POA: Diagnosis not present

## 2018-05-06 DIAGNOSIS — S42412D Displaced simple supracondylar fracture without intercondylar fracture of left humerus, subsequent encounter for fracture with routine healing: Secondary | ICD-10-CM | POA: Diagnosis not present

## 2018-05-06 DIAGNOSIS — E43 Unspecified severe protein-calorie malnutrition: Secondary | ICD-10-CM | POA: Diagnosis not present

## 2018-05-06 DIAGNOSIS — M069 Rheumatoid arthritis, unspecified: Secondary | ICD-10-CM | POA: Diagnosis not present

## 2018-05-06 DIAGNOSIS — Z7409 Other reduced mobility: Secondary | ICD-10-CM | POA: Diagnosis not present

## 2018-05-06 DIAGNOSIS — S72142D Displaced intertrochanteric fracture of left femur, subsequent encounter for closed fracture with routine healing: Secondary | ICD-10-CM | POA: Diagnosis not present

## 2018-05-06 DIAGNOSIS — E785 Hyperlipidemia, unspecified: Secondary | ICD-10-CM | POA: Diagnosis not present

## 2018-05-06 DIAGNOSIS — I10 Essential (primary) hypertension: Secondary | ICD-10-CM | POA: Diagnosis not present

## 2018-05-06 DIAGNOSIS — E871 Hypo-osmolality and hyponatremia: Secondary | ICD-10-CM | POA: Diagnosis not present

## 2018-05-07 DIAGNOSIS — E871 Hypo-osmolality and hyponatremia: Secondary | ICD-10-CM | POA: Diagnosis not present

## 2018-05-07 DIAGNOSIS — D62 Acute posthemorrhagic anemia: Secondary | ICD-10-CM | POA: Diagnosis not present

## 2018-05-07 DIAGNOSIS — I10 Essential (primary) hypertension: Secondary | ICD-10-CM | POA: Diagnosis not present

## 2018-05-07 DIAGNOSIS — S42192D Fracture of other part of scapula, left shoulder, subsequent encounter for fracture with routine healing: Secondary | ICD-10-CM | POA: Diagnosis not present

## 2018-05-07 DIAGNOSIS — S42412D Displaced simple supracondylar fracture without intercondylar fracture of left humerus, subsequent encounter for fracture with routine healing: Secondary | ICD-10-CM | POA: Diagnosis not present

## 2018-05-07 DIAGNOSIS — I129 Hypertensive chronic kidney disease with stage 1 through stage 4 chronic kidney disease, or unspecified chronic kidney disease: Secondary | ICD-10-CM | POA: Diagnosis not present

## 2018-05-07 DIAGNOSIS — S42102A Fracture of unspecified part of scapula, left shoulder, initial encounter for closed fracture: Secondary | ICD-10-CM | POA: Diagnosis not present

## 2018-05-07 DIAGNOSIS — S72142D Displaced intertrochanteric fracture of left femur, subsequent encounter for closed fracture with routine healing: Secondary | ICD-10-CM | POA: Diagnosis not present

## 2018-05-07 DIAGNOSIS — M069 Rheumatoid arthritis, unspecified: Secondary | ICD-10-CM | POA: Diagnosis not present

## 2018-05-07 DIAGNOSIS — E785 Hyperlipidemia, unspecified: Secondary | ICD-10-CM | POA: Diagnosis not present

## 2018-05-07 DIAGNOSIS — S72145A Nondisplaced intertrochanteric fracture of left femur, initial encounter for closed fracture: Secondary | ICD-10-CM | POA: Diagnosis not present

## 2018-05-07 DIAGNOSIS — K5900 Constipation, unspecified: Secondary | ICD-10-CM | POA: Diagnosis not present

## 2018-05-07 DIAGNOSIS — E43 Unspecified severe protein-calorie malnutrition: Secondary | ICD-10-CM | POA: Diagnosis not present

## 2018-05-07 DIAGNOSIS — G4700 Insomnia, unspecified: Secondary | ICD-10-CM | POA: Diagnosis not present

## 2018-05-07 DIAGNOSIS — R339 Retention of urine, unspecified: Secondary | ICD-10-CM | POA: Diagnosis not present

## 2018-05-07 DIAGNOSIS — S42212A Unspecified displaced fracture of surgical neck of left humerus, initial encounter for closed fracture: Secondary | ICD-10-CM | POA: Diagnosis not present

## 2018-05-08 DIAGNOSIS — R339 Retention of urine, unspecified: Secondary | ICD-10-CM | POA: Diagnosis not present

## 2018-05-08 DIAGNOSIS — E43 Unspecified severe protein-calorie malnutrition: Secondary | ICD-10-CM | POA: Diagnosis not present

## 2018-05-08 DIAGNOSIS — D62 Acute posthemorrhagic anemia: Secondary | ICD-10-CM | POA: Diagnosis not present

## 2018-05-08 DIAGNOSIS — S42192D Fracture of other part of scapula, left shoulder, subsequent encounter for fracture with routine healing: Secondary | ICD-10-CM | POA: Diagnosis not present

## 2018-05-08 DIAGNOSIS — E785 Hyperlipidemia, unspecified: Secondary | ICD-10-CM | POA: Diagnosis not present

## 2018-05-08 DIAGNOSIS — S72142D Displaced intertrochanteric fracture of left femur, subsequent encounter for closed fracture with routine healing: Secondary | ICD-10-CM | POA: Diagnosis not present

## 2018-05-08 DIAGNOSIS — E871 Hypo-osmolality and hyponatremia: Secondary | ICD-10-CM | POA: Diagnosis not present

## 2018-05-08 DIAGNOSIS — S42102A Fracture of unspecified part of scapula, left shoulder, initial encounter for closed fracture: Secondary | ICD-10-CM | POA: Diagnosis not present

## 2018-05-08 DIAGNOSIS — S42412D Displaced simple supracondylar fracture without intercondylar fracture of left humerus, subsequent encounter for fracture with routine healing: Secondary | ICD-10-CM | POA: Diagnosis not present

## 2018-05-08 DIAGNOSIS — G4700 Insomnia, unspecified: Secondary | ICD-10-CM | POA: Diagnosis not present

## 2018-05-08 DIAGNOSIS — I10 Essential (primary) hypertension: Secondary | ICD-10-CM | POA: Diagnosis not present

## 2018-05-08 DIAGNOSIS — I129 Hypertensive chronic kidney disease with stage 1 through stage 4 chronic kidney disease, or unspecified chronic kidney disease: Secondary | ICD-10-CM | POA: Diagnosis not present

## 2018-05-08 DIAGNOSIS — K5900 Constipation, unspecified: Secondary | ICD-10-CM | POA: Diagnosis not present

## 2018-05-08 DIAGNOSIS — M069 Rheumatoid arthritis, unspecified: Secondary | ICD-10-CM | POA: Diagnosis not present

## 2018-05-08 DIAGNOSIS — S42212A Unspecified displaced fracture of surgical neck of left humerus, initial encounter for closed fracture: Secondary | ICD-10-CM | POA: Diagnosis not present

## 2018-05-08 DIAGNOSIS — S72145A Nondisplaced intertrochanteric fracture of left femur, initial encounter for closed fracture: Secondary | ICD-10-CM | POA: Diagnosis not present

## 2018-05-09 DIAGNOSIS — M069 Rheumatoid arthritis, unspecified: Secondary | ICD-10-CM | POA: Diagnosis not present

## 2018-05-09 DIAGNOSIS — S72145D Nondisplaced intertrochanteric fracture of left femur, subsequent encounter for closed fracture with routine healing: Secondary | ICD-10-CM | POA: Diagnosis not present

## 2018-05-09 DIAGNOSIS — I129 Hypertensive chronic kidney disease with stage 1 through stage 4 chronic kidney disease, or unspecified chronic kidney disease: Secondary | ICD-10-CM | POA: Diagnosis not present

## 2018-05-09 DIAGNOSIS — S42102D Fracture of unspecified part of scapula, left shoulder, subsequent encounter for fracture with routine healing: Secondary | ICD-10-CM | POA: Diagnosis not present

## 2018-05-09 DIAGNOSIS — I10 Essential (primary) hypertension: Secondary | ICD-10-CM | POA: Diagnosis not present

## 2018-05-09 DIAGNOSIS — D62 Acute posthemorrhagic anemia: Secondary | ICD-10-CM | POA: Diagnosis not present

## 2018-05-09 DIAGNOSIS — S42192D Fracture of other part of scapula, left shoulder, subsequent encounter for fracture with routine healing: Secondary | ICD-10-CM | POA: Diagnosis not present

## 2018-05-09 DIAGNOSIS — G4700 Insomnia, unspecified: Secondary | ICD-10-CM | POA: Diagnosis not present

## 2018-05-09 DIAGNOSIS — E785 Hyperlipidemia, unspecified: Secondary | ICD-10-CM | POA: Diagnosis not present

## 2018-05-09 DIAGNOSIS — S72142D Displaced intertrochanteric fracture of left femur, subsequent encounter for closed fracture with routine healing: Secondary | ICD-10-CM | POA: Diagnosis not present

## 2018-05-09 DIAGNOSIS — Z7409 Other reduced mobility: Secondary | ICD-10-CM | POA: Diagnosis not present

## 2018-05-09 DIAGNOSIS — K5900 Constipation, unspecified: Secondary | ICD-10-CM | POA: Diagnosis not present

## 2018-05-09 DIAGNOSIS — E43 Unspecified severe protein-calorie malnutrition: Secondary | ICD-10-CM | POA: Diagnosis not present

## 2018-05-09 DIAGNOSIS — S42412D Displaced simple supracondylar fracture without intercondylar fracture of left humerus, subsequent encounter for fracture with routine healing: Secondary | ICD-10-CM | POA: Diagnosis not present

## 2018-05-09 DIAGNOSIS — E871 Hypo-osmolality and hyponatremia: Secondary | ICD-10-CM | POA: Diagnosis not present

## 2018-05-10 DIAGNOSIS — E43 Unspecified severe protein-calorie malnutrition: Secondary | ICD-10-CM | POA: Diagnosis not present

## 2018-05-10 DIAGNOSIS — E871 Hypo-osmolality and hyponatremia: Secondary | ICD-10-CM | POA: Diagnosis not present

## 2018-05-10 DIAGNOSIS — M069 Rheumatoid arthritis, unspecified: Secondary | ICD-10-CM | POA: Diagnosis not present

## 2018-05-10 DIAGNOSIS — S42102D Fracture of unspecified part of scapula, left shoulder, subsequent encounter for fracture with routine healing: Secondary | ICD-10-CM | POA: Diagnosis not present

## 2018-05-10 DIAGNOSIS — I10 Essential (primary) hypertension: Secondary | ICD-10-CM | POA: Diagnosis not present

## 2018-05-10 DIAGNOSIS — S42192D Fracture of other part of scapula, left shoulder, subsequent encounter for fracture with routine healing: Secondary | ICD-10-CM | POA: Diagnosis not present

## 2018-05-10 DIAGNOSIS — K5900 Constipation, unspecified: Secondary | ICD-10-CM | POA: Diagnosis not present

## 2018-05-10 DIAGNOSIS — D62 Acute posthemorrhagic anemia: Secondary | ICD-10-CM | POA: Diagnosis not present

## 2018-05-10 DIAGNOSIS — G4700 Insomnia, unspecified: Secondary | ICD-10-CM | POA: Diagnosis not present

## 2018-05-10 DIAGNOSIS — S72142D Displaced intertrochanteric fracture of left femur, subsequent encounter for closed fracture with routine healing: Secondary | ICD-10-CM | POA: Diagnosis not present

## 2018-05-10 DIAGNOSIS — R5381 Other malaise: Secondary | ICD-10-CM | POA: Diagnosis not present

## 2018-05-10 DIAGNOSIS — S42412D Displaced simple supracondylar fracture without intercondylar fracture of left humerus, subsequent encounter for fracture with routine healing: Secondary | ICD-10-CM | POA: Diagnosis not present

## 2018-05-10 DIAGNOSIS — Z743 Need for continuous supervision: Secondary | ICD-10-CM | POA: Diagnosis not present

## 2018-05-10 DIAGNOSIS — E785 Hyperlipidemia, unspecified: Secondary | ICD-10-CM | POA: Diagnosis not present

## 2018-05-10 DIAGNOSIS — R279 Unspecified lack of coordination: Secondary | ICD-10-CM | POA: Diagnosis not present

## 2018-05-10 DIAGNOSIS — I129 Hypertensive chronic kidney disease with stage 1 through stage 4 chronic kidney disease, or unspecified chronic kidney disease: Secondary | ICD-10-CM | POA: Diagnosis not present

## 2018-05-10 DIAGNOSIS — S42212D Unspecified displaced fracture of surgical neck of left humerus, subsequent encounter for fracture with routine healing: Secondary | ICD-10-CM | POA: Diagnosis not present

## 2018-05-10 DIAGNOSIS — S72145D Nondisplaced intertrochanteric fracture of left femur, subsequent encounter for closed fracture with routine healing: Secondary | ICD-10-CM | POA: Diagnosis not present

## 2018-05-11 ENCOUNTER — Other Ambulatory Visit: Payer: Self-pay | Admitting: Family Medicine

## 2018-05-11 ENCOUNTER — Encounter: Payer: Self-pay | Admitting: Family Medicine

## 2018-05-11 DIAGNOSIS — M546 Pain in thoracic spine: Principal | ICD-10-CM

## 2018-05-11 DIAGNOSIS — G8929 Other chronic pain: Secondary | ICD-10-CM

## 2018-05-11 DIAGNOSIS — M0579 Rheumatoid arthritis with rheumatoid factor of multiple sites without organ or systems involvement: Secondary | ICD-10-CM

## 2018-05-12 ENCOUNTER — Other Ambulatory Visit: Payer: Self-pay | Admitting: Family Medicine

## 2018-05-12 DIAGNOSIS — Z9181 History of falling: Secondary | ICD-10-CM | POA: Diagnosis not present

## 2018-05-12 DIAGNOSIS — E43 Unspecified severe protein-calorie malnutrition: Secondary | ICD-10-CM | POA: Diagnosis not present

## 2018-05-12 DIAGNOSIS — I2510 Atherosclerotic heart disease of native coronary artery without angina pectoris: Secondary | ICD-10-CM | POA: Diagnosis not present

## 2018-05-12 DIAGNOSIS — M80022D Age-related osteoporosis with current pathological fracture, left humerus, subsequent encounter for fracture with routine healing: Secondary | ICD-10-CM | POA: Diagnosis not present

## 2018-05-12 DIAGNOSIS — J439 Emphysema, unspecified: Secondary | ICD-10-CM | POA: Diagnosis not present

## 2018-05-12 DIAGNOSIS — I129 Hypertensive chronic kidney disease with stage 1 through stage 4 chronic kidney disease, or unspecified chronic kidney disease: Secondary | ICD-10-CM | POA: Diagnosis not present

## 2018-05-12 DIAGNOSIS — M80012D Age-related osteoporosis with current pathological fracture, left shoulder, subsequent encounter for fracture with routine healing: Secondary | ICD-10-CM | POA: Diagnosis not present

## 2018-05-12 DIAGNOSIS — M109 Gout, unspecified: Secondary | ICD-10-CM | POA: Diagnosis not present

## 2018-05-12 DIAGNOSIS — R0982 Postnasal drip: Secondary | ICD-10-CM

## 2018-05-12 DIAGNOSIS — F17210 Nicotine dependence, cigarettes, uncomplicated: Secondary | ICD-10-CM | POA: Diagnosis not present

## 2018-05-12 DIAGNOSIS — N189 Chronic kidney disease, unspecified: Secondary | ICD-10-CM | POA: Diagnosis not present

## 2018-05-12 DIAGNOSIS — M069 Rheumatoid arthritis, unspecified: Secondary | ICD-10-CM | POA: Diagnosis not present

## 2018-05-12 DIAGNOSIS — M80052D Age-related osteoporosis with current pathological fracture, left femur, subsequent encounter for fracture with routine healing: Secondary | ICD-10-CM | POA: Diagnosis not present

## 2018-05-12 DIAGNOSIS — M8008XD Age-related osteoporosis with current pathological fracture, vertebra(e), subsequent encounter for fracture with routine healing: Secondary | ICD-10-CM | POA: Diagnosis not present

## 2018-06-06 DIAGNOSIS — S72002D Fracture of unspecified part of neck of left femur, subsequent encounter for closed fracture with routine healing: Secondary | ICD-10-CM | POA: Diagnosis not present

## 2018-06-06 DIAGNOSIS — S42112D Displaced fracture of body of scapula, left shoulder, subsequent encounter for fracture with routine healing: Secondary | ICD-10-CM | POA: Diagnosis not present

## 2018-06-07 DIAGNOSIS — M1A09X0 Idiopathic chronic gout, multiple sites, without tophus (tophi): Secondary | ICD-10-CM | POA: Diagnosis not present

## 2018-06-07 DIAGNOSIS — R7612 Nonspecific reaction to cell mediated immunity measurement of gamma interferon antigen response without active tuberculosis: Secondary | ICD-10-CM | POA: Diagnosis not present

## 2018-06-07 DIAGNOSIS — M0579 Rheumatoid arthritis with rheumatoid factor of multiple sites without organ or systems involvement: Secondary | ICD-10-CM | POA: Diagnosis not present

## 2018-06-08 ENCOUNTER — Ambulatory Visit: Payer: Medicare Other | Attending: Family Medicine | Admitting: Physician Assistant

## 2018-06-08 ENCOUNTER — Encounter: Payer: Self-pay | Admitting: Family Medicine

## 2018-06-08 ENCOUNTER — Telehealth: Payer: Self-pay

## 2018-06-08 VITALS — BP 112/72 | HR 77 | Temp 98.3°F | Resp 16 | Wt 120.4 lb

## 2018-06-08 DIAGNOSIS — I10 Essential (primary) hypertension: Secondary | ICD-10-CM | POA: Diagnosis not present

## 2018-06-08 DIAGNOSIS — Z91041 Radiographic dye allergy status: Secondary | ICD-10-CM | POA: Diagnosis not present

## 2018-06-08 DIAGNOSIS — S72002A Fracture of unspecified part of neck of left femur, initial encounter for closed fracture: Secondary | ICD-10-CM | POA: Insufficient documentation

## 2018-06-08 DIAGNOSIS — M0579 Rheumatoid arthritis with rheumatoid factor of multiple sites without organ or systems involvement: Secondary | ICD-10-CM

## 2018-06-08 DIAGNOSIS — Z886 Allergy status to analgesic agent status: Secondary | ICD-10-CM | POA: Insufficient documentation

## 2018-06-08 DIAGNOSIS — Z91013 Allergy to seafood: Secondary | ICD-10-CM | POA: Diagnosis not present

## 2018-06-08 DIAGNOSIS — Z79899 Other long term (current) drug therapy: Secondary | ICD-10-CM | POA: Diagnosis not present

## 2018-06-08 DIAGNOSIS — Z91011 Allergy to milk products: Secondary | ICD-10-CM | POA: Diagnosis not present

## 2018-06-08 DIAGNOSIS — Z993 Dependence on wheelchair: Secondary | ICD-10-CM | POA: Diagnosis not present

## 2018-06-08 DIAGNOSIS — E785 Hyperlipidemia, unspecified: Secondary | ICD-10-CM | POA: Insufficient documentation

## 2018-06-08 DIAGNOSIS — Z792 Long term (current) use of antibiotics: Secondary | ICD-10-CM | POA: Diagnosis not present

## 2018-06-08 DIAGNOSIS — M810 Age-related osteoporosis without current pathological fracture: Secondary | ICD-10-CM | POA: Diagnosis not present

## 2018-06-08 DIAGNOSIS — Z881 Allergy status to other antibiotic agents status: Secondary | ICD-10-CM | POA: Diagnosis not present

## 2018-06-08 DIAGNOSIS — Z23 Encounter for immunization: Secondary | ICD-10-CM | POA: Diagnosis not present

## 2018-06-08 NOTE — Progress Notes (Signed)
Patient ID: Kristy Oliver, female   DOB: [birth year], 73 y.o.   MRN: [id number]   Kristy Oliver, is a 73 y.o. female  GMW:[id number]  YQI:[id number]  DOB - 07-10-1945  Subjective:  Chief Complaint and HPI: Kristy Oliver is a 73 y.o. female here today for a follow up visit after L hip and L elbow fracture in November 2019.  She has had surgery and is being followed by ortho and received care at [facility].  A family member is with her and asking for increase in Home health and information on day centers for adults.  In wheelchair.  Needs help with ADL.   ED/Hospital notes reviewed.    ROS:   Constitutional:  No f/c, No night sweats, No unexplained weight loss. EENT:  No new vision changes, No blurry vision, No hearing changes. No mouth, throat, or ear problems.  Respiratory: No cough, No SOB Cardiac: No CP, no palpitations GI:  No abd pain, No N/V/D. GU: No Urinary s/sx Musculoskeletal: +pain L side of body Neuro: No headache, no dizziness, no motor weakness.  Skin: No rash Endocrine:  No polydipsia. No polyuria.  Psych: Denies SI/HI  No problems updated.  ALLERGIES: Allergies  Allergen Reactions  . Clindamycin Hives, Rash and Swelling  . Iodinated Diagnostic Agents     unknown  . Metrizamide     unknown  . Motrin [Ibuprofen] Other (See Comments)    blackout States she is only allergic to 800 mg formulation  . Milk-Related Compounds Diarrhea  . Shrimp [Shellfish Allergy] Hives, Swelling and Rash    PAST MEDICAL HISTORY: Past Medical History:  Diagnosis Date  . Allergy   . Arthritis   . Asthma    as young person, none currently  . Facial cellulitis 11/17/2012  . Gout   . Helicobacter pylori gastritis 12/13/2016  . HTN (hypertension), malignant 11/17/2012  . Hx of adenomatous colonic polyps 12/29/2016  . Hyperlipidemia    medicine currently  . Hypertension   . Osteoporosis   . Renal disorder   . Tuberculosis    pt took meds for 9 months - finish meds last week      MEDICATIONS AT HOME: Prior to Admission medications   Medication Sig Start Date End Date Taking? Authorizing Provider  alendronate (FOSAMAX) 70 MG tablet Take 1 tablet (70 mg total) by mouth once a week. Take with a full glass of water on an empty stomach. 03/15/18  Yes Charlott Rakes, MD  amLODipine (NORVASC) 10 MG tablet Take 1 tablet (10 mg total) by mouth daily. 03/14/18  Yes Newlin, Charlane Ferretti, MD  carvedilol (COREG) 6.25 MG tablet TAKE 1 TABLET BY MOUTH 2 TIMES A DAY WITH A MEAL 03/14/18  Yes Newlin, Enobong, MD  cetirizine (ZYRTEC) 10 MG tablet TAKE 1 TABLET BY MOUTH ONCE DAILY 05/13/18  Yes Newlin, Enobong, MD  fluticasone (FLONASE) 50 MCG/ACT nasal spray INSTILL 2 SPRAYS INTO EACH NOSTRIL DAILY 03/25/18  Yes Charlott Rakes, MD  folic acid (FOLVITE) 387 MCG tablet Take 1 tablet (400 mcg total) by mouth daily. 03/14/18  Yes Charlott Rakes, MD  tiZANidine (ZANAFLEX) 4 MG tablet Take 1 tablet (4 mg total) by mouth every 8 (eight) hours as needed for muscle spasms. 03/14/18  Yes Newlin, Charlane Ferretti, MD  clotrimazole (LOTRIMIN) 1 % cream APPLY TO AFFECTED AREA ON SKIN TWICE A DAY Patient not taking: Reported on 04/13/2017 06/25/16   Charlott Rakes, MD  dronabinol (MARINOL) 2.5 MG capsule Take 1 capsule (2.5 mg total) by mouth 2 (two)  times daily before lunch and supper. Patient not taking: Reported on 12/21/2017 09/08/13   Debbe Odea, MD  hydroxychloroquine (PLAQUENIL) 200 MG tablet TAKE 1 TABLET BY MOUTH EVERY DAY Patient not taking: Reported on 06/08/2018 03/28/18   Bo Merino, MD  lactulose (CHRONULAC) 10 GM/15ML solution TAKE 15 MLS BY MOUTH THREE TIMES A DAY 03/15/18   Charlott Rakes, MD  lisinopril (PRINIVIL,ZESTRIL) 20 MG tablet Take by mouth.    [provider]  methotrexate (RHEUMATREX) 2.5 MG tablet Take 4 tablets by mouth once a week. Increase to 6 tablets once per week if labs are stable. Caution:Chemotherapy. Protect from light. Patient not taking: Reported on  08/25/2017 08/03/17   Bo Merino, MD  metroNIDAZOLE (FLAGYL) 500 MG tablet Take by mouth.    [provider]  mirtazapine (REMERON) 15 MG tablet Take 1 tablet (15 mg total) by mouth at bedtime. Patient not taking: Reported on 06/08/2018 12/21/17   Charlott Rakes, MD  pravastatin (PRAVACHOL) 20 MG tablet Take 1 tablet (20 mg total) by mouth daily. Patient not taking: Reported on 12/21/2017 04/13/17   Charlott Rakes, MD  predniSONE (DELTASONE) 5 MG tablet Take 1 tablet (5 mg total) by mouth daily with breakfast. Patient not taking: Reported on 12/21/2017 08/20/17   Bo Merino, MD  rifampin (RIFADIN) 150 MG capsule Take 300 mg by mouth daily.    [provider]  sulfamethoxazole-trimethoprim (BACTRIM DS,SEPTRA DS) 800-160 MG tablet Take by mouth. 04/06/17   [provider]  terbinafine (LAMISIL AT) 1 % cream Apply 1 application topically 2 (two) times daily. Patient not taking: Reported on 08/03/2017 09/23/15   Charlott Rakes, MD  traMADol (ULTRAM) 50 MG tablet Take 1 tablet (50 mg total) by mouth every 12 (twelve) hours as needed. Patient not taking: Reported on 12/21/2017 11/02/16   Charlott Rakes, MD     Objective:  EXAM:   Vitals:   06/08/18 0923  BP: 112/72  Pulse: 77  Resp: 16  Temp: 98.3 F (36.8 C)  TempSrc: Oral  SpO2: 96%  Weight: 120 lb 6.4 oz (54.6 kg)    General appearance : A&OX3. NAD. Non-toxic-appearing.  Appears frail, in wheel chair. HEENT: Atraumatic and Normocephalic.  PERRLA. EOM intact.   Neck: supple, no JVD. No cervical lymphadenopathy. No thyromegaly Chest/Lungs:  Breathing-non-labored, Good air entry bilaterally, breath sounds normal without rales, rhonchi, or wheezing  CVS: S1 S2 regular, no murmurs, gallops, rubs  Extremities: Bilateral Lower Ext shows no edema, both legs are warm to touch with = pulse throughout Neurology:  CN II-XII grossly intact, Non focal.   Skin:  No Rash  Data Review Lab Results  Component  Value Date   HGBA1C 5.5 11/02/2016     Assessment & Plan   1. Essential hypertension Controlled-continue current regimen  2. Need for immunization against influenza - Flu Vaccine QUAD 36+ mos IM  3. Closed fracture of left hip, initial encounter ([place]) Continue follow-up with ortho.  Home health referral placed.  Discussed with [facility] and [facility]  4. Need for vaccination against Streptococcus pneumoniae using pneumococcal conjugate vaccine 13 prevnar given  Patient have been counseled extensively about nutrition and exercise  Return in about 6 weeks (around 07/20/2018) for Dr Newlin-f/up chronic medical conditions.  The patient was given clear instructions to go to ER or return to medical center if symptoms don't improve, worsen or new problems develop. The patient verbalized understanding. The patient was told to call to get lab results if they haven't heard  anything in the next week.     Freeman Caldron, PA-C [facility] [place], [place]   06/08/2018, 9:58 AM

## 2018-06-08 NOTE — Telephone Encounter (Signed)
Met with the patient and her grandson,Dacario, when she was in the clinic today for her appointment.  explained that a referral was being made for home health nursing and PT.  They did not have a preference for home health agencies. Her grandson explained that she had aide services through [employer] but they are very upset with [facility], the agency that provided the aide as the patient fell while under the care of the aide.  This CM explained that he needs to call Janeece Riggers and explain his concerns and he can request another agency.  He was provided with the phone # for [facility]. He said that his grandmother was denied [place] as she is now in [place]. Provided him with the application for [facility] transportation and explained that this CM would contact him if there is a comparable handicapped transit in [place].    Call placed to [facility], spoke to [facility] who confirmed that they are accepting referrals for SN and PT in the patient's zip code.  Referral for home health services was faxed to [facility] # [phone].  Call placed to patient's grandson, Raymond Gurney and provided him with the phone # for [facility] to inquire about the services they provide.   He also stated that he contacted [facility] care and they have a representative coming out to see his grandmother. He said that he told them that he wanted to change agency provider for the aide.   Informed him that a referral for home health services was faxed to [facility].

## 2018-06-08 NOTE — Patient Instructions (Signed)
Pneumococcal Conjugate Vaccine (PCV13): What You Need to Know  1. Why get vaccinated?  Vaccination can protect both children and adults from pneumococcal disease.  Pneumococcal disease is caused by bacteria that can spread from person to person through close contact. It can cause ear infections, and it can also lead to more serious infections of the:  · Lungs (pneumonia),  · Blood (bacteremia), and  · Covering of the brain and spinal cord (meningitis).  Pneumococcal pneumonia is most common among adults. Pneumococcal meningitis can cause deafness and brain damage, and it kills about 1 child in 10 who get it.  Anyone can get pneumococcal disease, but children under 2 years of age and adults 65 years and older, people with certain medical conditions, and cigarette smokers are at the highest risk.  Before there was a vaccine, the [place] saw:  · more than 700 cases of meningitis,  · about 13,000 blood infections,  · about 5 million ear infections, and  · about 200 deaths  in children under 5 each year from pneumococcal disease. Since vaccine became available, severe pneumococcal disease in these children has fallen by 88%.  About 18,000 older adults die of pneumococcal disease each year in the [place].  Treatment of pneumococcal infections with penicillin and other drugs is not as effective as it used to be, because some strains of the disease have become resistant to these drugs. This makes prevention of the disease, through vaccination, even more important.  2. PCV13 vaccine  Pneumococcal conjugate vaccine (called PCV13) protects against 13 types of pneumococcal bacteria.  PCV13 is routinely given to children at 2, 4, 6, and 12-15 months of age. It is also recommended for children and adults 2 to 64 years of age with certain health conditions, and for all adults 65 years of age and older. Your doctor can give you details.  3. Some people should not get this vaccine  Anyone who has ever had a  life-threatening allergic reaction to a dose of this vaccine, to an earlier pneumococcal vaccine called PCV7, or to any vaccine containing diphtheria toxoid (for example, DTaP), should not get PCV13.  Anyone with a severe allergy to any component of PCV13 should not get the vaccine. Tell your doctor if the person being vaccinated has any severe allergies.  If the person scheduled for vaccination is not feeling well, your healthcare provider might decide to reschedule the shot on another day.  4. Risks of a vaccine reaction  With any medicine, including vaccines, there is a chance of reactions. These are usually mild and go away on their own, but serious reactions are also possible.  Problems reported following PCV13 varied by age and dose in the series. The most common problems reported among children were:  · About half became drowsy after the shot, had a temporary loss of appetite, or had redness or tenderness where the shot was given.  · About 1 out of 3 had swelling where the shot was given.  · About 1 out of 3 had a mild fever, and about 1 in 20 had a fever over 102.2°F.  · Up to about 8 out of 10 became fussy or irritable.  Adults have reported pain, redness, and swelling where the shot was given; also mild fever, fatigue, headache, chills, or muscle pain.  Young children who get PCV13 along with inactivated flu vaccine at the same time may be at increased risk for seizures caused by fever. Ask your doctor for more   caused by a fall. Tell your doctor if you feel dizzy, or have vision changes or ringing in the ears.  Some older children and adults get severe pain in the shoulder and have difficulty moving the arm where a shot was given. This happens very rarely.  Any medication can cause a  severe allergic reaction. Such reactions from a vaccine are very rare, estimated at about 1 in a million doses, and would happen within a few minutes to a few hours after the vaccination. As with any medicine, there is a very small chance of a vaccine causing a serious injury or death. The safety of vaccines is always being monitored. For more information, visit: http://www.aguilar.org/ 5. What if there is a serious reaction? What should I look for?  Look for anything that concerns you, such as signs of a severe allergic reaction, very high fever, or unusual behavior. Signs of a severe allergic reaction can include hives, swelling of the face and throat, difficulty breathing, a fast heartbeat, dizziness, and weakness-usually within a few minutes to a few hours after the vaccination. What should I do?  If you think it is a severe allergic reaction or other emergency that can't wait, call 9-1-1 or get the person to the nearest hospital. Otherwise, call your doctor. Reactions should be reported to the Vaccine Adverse Event Reporting System (VAERS). Your doctor should file this report, or you can do it yourself through the VAERS web site at www.vaers.SamedayNews.es, or by calling (216)538-4515. VAERS does not give medical advice. 6. The National Vaccine Injury Compensation Program The [employer] Vaccine Injury Compensation Program (VICP) is a federal program that was created to compensate people who may have been injured by certain vaccines. Persons who believe they may have been injured by a vaccine can learn about the program and about filing a claim by calling 847-615-8706 or visiting the [place] website at GoldCloset.com.ee. There is a time limit to file a claim for compensation. 7. How can I learn more?  Ask your healthcare provider. He or she can give you the vaccine package insert or suggest other sources of information.  Call your local or state health department.  Contact the  Centers for Disease Control and Prevention (CDC): ? Call (680) 686-8853 (1-800-CDC-INFO) or ? Visit CDC's website at http://hunter.com/ Vaccine Information Statement PCV13 Vaccine (03/29/2014) This information is not intended to replace advice given to you by your health care provider. Make sure you discuss any questions you have with your health care provider. Document Released: 03/08/2006 Document Revised: 12/21/2017 Document Reviewed: 12/21/2017 Elsevier Interactive Patient Education  2019 Elsevier Inc.  Influenza Virus Vaccine injection (Fluarix) What is this medicine? INFLUENZA VIRUS VACCINE (in floo EN zuh VAHY ruhs vak SEEN) helps to reduce the risk of getting influenza also known as the flu. This medicine may be used for other purposes; ask your health care provider or pharmacist if you have questions. COMMON BRAND NAME(S): Fluarix, Fluzone What should I tell my health care provider before I take this medicine? They need to know if you have any of these conditions: -bleeding disorder like hemophilia -fever or infection -Guillain-Barre syndrome or other neurological problems -immune system problems -infection with the human immunodeficiency virus (HIV) or AIDS -low blood platelet counts -multiple sclerosis -an unusual or allergic reaction to influenza virus vaccine, eggs, chicken proteins, latex, gentamicin, other medicines, foods, dyes or preservatives -pregnant or trying to get pregnant -breast-feeding How should I use this medicine? This vaccine is for injection into a muscle. It  is given by a health care professional. A copy of Vaccine Information Statements will be given before each vaccination. Read this sheet carefully each time. The sheet may change frequently. Talk to your pediatrician regarding the use of this medicine in children. Special care may be needed. Overdosage: If you think you have taken too much of this medicine contact a poison control center or emergency  room at once. NOTE: This medicine is only for you. Do not share this medicine with others. What if I miss a dose? This does not apply. What may interact with this medicine? -chemotherapy or radiation therapy -medicines that lower your immune system like etanercept, anakinra, infliximab, and adalimumab -medicines that treat or prevent blood clots like warfarin -phenytoin -steroid medicines like prednisone or cortisone -theophylline -vaccines This list may not describe all possible interactions. Give your health care provider a list of all the medicines, herbs, non-prescription drugs, or dietary supplements you use. Also tell them if you smoke, drink alcohol, or use illegal drugs. Some items may interact with your medicine. What should I watch for while using this medicine? Report any side effects that do not go away within 3 days to your doctor or health care professional. Call your health care provider if any unusual symptoms occur within 6 weeks of receiving this vaccine. You may still catch the flu, but the illness is not usually as bad. You cannot get the flu from the vaccine. The vaccine will not protect against colds or other illnesses that may cause fever. The vaccine is needed every year. What side effects may I notice from receiving this medicine? Side effects that you should report to your doctor or health care professional as soon as possible: -allergic reactions like skin rash, itching or hives, swelling of the face, lips, or tongue Side effects that usually do not require medical attention (report to your doctor or health care professional if they continue or are bothersome): -fever -headache -muscle aches and pains -pain, tenderness, redness, or swelling at site where injected -weak or tired This list may not describe all possible side effects. Call your doctor for medical advice about side effects. You may report side effects to FDA at 1-800-FDA-1088. Where should I keep my  medicine? This vaccine is only given in a clinic, pharmacy, doctor's office, or other health care setting and will not be stored at home. NOTE: This sheet is a summary. It may not cover all possible information. If you have questions about this medicine, talk to your doctor, pharmacist, or health care provider.  2019 Elsevier/Gold Standard (2007-12-07 09:30:40)

## 2018-06-08 NOTE — Progress Notes (Signed)
Pt states she is having some soreness in her left groin

## 2018-06-13 ENCOUNTER — Telehealth: Payer: Self-pay

## 2018-06-13 ENCOUNTER — Telehealth: Payer: Self-pay | Admitting: Family Medicine

## 2018-06-13 NOTE — Telephone Encounter (Signed)
Kristy Oliver was called and given verbal orders for PT and Education officer, museum.

## 2018-06-13 NOTE — Telephone Encounter (Signed)
Call placed to [facility], spoke to [place] who confirmed that the patient has been seen by their staff - nursing on 06/11/2018 and PT on 06/12/2018

## 2018-06-13 NOTE — Telephone Encounter (Signed)
Chris from [place] called requesting VO for PT 2X a week for 3 weeks and 1X a week for 1 week. Also he is requesting an order for Education officer, museum.

## 2018-06-14 ENCOUNTER — Telehealth: Payer: Self-pay | Admitting: Family Medicine

## 2018-06-14 ENCOUNTER — Other Ambulatory Visit: Payer: Self-pay | Admitting: Family Medicine

## 2018-06-14 DIAGNOSIS — I10 Essential (primary) hypertension: Secondary | ICD-10-CM

## 2018-06-14 DIAGNOSIS — G8929 Other chronic pain: Secondary | ICD-10-CM

## 2018-06-14 DIAGNOSIS — M546 Pain in thoracic spine: Principal | ICD-10-CM

## 2018-06-14 MED ORDER — AMLODIPINE BESYLATE 10 MG PO TABS: 10.0000 mg | ORAL_TABLET | Freq: Every day | ORAL | 0 refills | Status: DC

## 2018-06-14 NOTE — Telephone Encounter (Signed)
[name] was called back and given an appointment with Lurena Joiner to go over medications.

## 2018-06-14 NOTE — Telephone Encounter (Signed)
Pt grand daughter Kristy Oliver called for clarification of pt current medication list. She states the list in MyChart reflects all medications the patient has ever been on and they need to know the current list correct list. Kristy Oliver is on the [facility]. Confirmed phone [phone]. [name] states they have another provider today that needs the correct list.

## 2018-06-14 NOTE — Telephone Encounter (Signed)
[facility] called for verbal orders  For Skilled nursing  1 week 1 2 week 2  1 week 2 2 prn Home health aide for 2 week 3

## 2018-06-14 NOTE — Telephone Encounter (Signed)
Amber was called and given verbal orders.

## 2018-06-15 ENCOUNTER — Encounter: Payer: Self-pay | Admitting: Pharmacist

## 2018-06-15 ENCOUNTER — Ambulatory Visit: Payer: Medicare Other | Attending: Family Medicine | Admitting: Pharmacist

## 2018-06-15 DIAGNOSIS — Z79899 Other long term (current) drug therapy: Secondary | ICD-10-CM

## 2018-06-15 DIAGNOSIS — M069 Rheumatoid arthritis, unspecified: Secondary | ICD-10-CM | POA: Insufficient documentation

## 2018-06-15 DIAGNOSIS — I10 Essential (primary) hypertension: Secondary | ICD-10-CM | POA: Diagnosis not present

## 2018-06-15 MED ORDER — CARVEDILOL 6.25 MG PO TABS: ORAL_TABLET | ORAL | 2 refills | Status: DC

## 2018-06-15 NOTE — Progress Notes (Signed)
S:    PCP: Dr. Margarita Rana  Patient's grandson presents for medication reconciliation on the behalf of Kristy Oliver. Pt is under the care of Dr. Margarita Rana. Pt's grandson reports multiple medications listed on her profile that she is not taking.   Of note, he reports that his grandmother no longer sees Dr. Estanislado Pandy. Confirmed through Care Everywhere that the pt sees PA Tana Coast of [facility] for RA. Notes reviewed. Pt is not taking MTX, prednisone, or Plaquenil for RA. She is taking leflunomide only.   Additionally, I have included her outpatient medications prior to this visit below. Consulted with Dr. Margarita Rana. Will remove old prescriptions and those reported not taking. Pt has a follow-up with Dr. Margarita Rana planned for next month.  Outpatient Medications Prior to Visit  Medication Sig Dispense Refill  . amLODipine (NORVASC) 10 MG tablet Take 1 tablet (10 mg total) by mouth daily. 90 tablet 0  . cetirizine (ZYRTEC) 10 MG tablet TAKE 1 TABLET BY MOUTH ONCE DAILY 30 tablet 1  . fluticasone (FLONASE) 50 MCG/ACT nasal spray INSTILL 2 SPRAYS INTO EACH NOSTRIL DAILY 16 g 2  . folic acid (FOLVITE) 244 MCG tablet Take 1 tablet (400 mcg total) by mouth daily. 90 tablet 0  . leflunomide (ARAVA) 10 MG tablet Take 10 mg by mouth daily.    Marland Kitchen tiZANidine (ZANAFLEX) 4 MG tablet TAKE 1 TABLET BY MOUTH EVERY 12 HOURS AS NEEDED FOR MUSCLE SPASMS 60 tablet 2  . pravastatin (PRAVACHOL) 20 MG tablet Take 1 tablet (20 mg total) by mouth daily. (Patient not taking: Reported on 12/21/2017) 30 tablet 5  . alendronate (FOSAMAX) 70 MG tablet Take 1 tablet (70 mg total) by mouth once a week. Take with a full glass of water on an empty stomach. 12 tablet 1  . carvedilol (COREG) 6.25 MG tablet TAKE 1 TABLET BY MOUTH 2 TIMES A DAY WITH A MEAL 180 tablet 0  . clotrimazole (LOTRIMIN) 1 % cream APPLY TO AFFECTED AREA ON SKIN TWICE A DAY (Patient not taking: Reported on 04/13/2017) 30 g 1  . dronabinol (MARINOL) 2.5 MG capsule Take 1  capsule (2.5 mg total) by mouth 2 (two) times daily before lunch and supper. (Patient not taking: Reported on 12/21/2017) 60 capsule 3  . hydroxychloroquine (PLAQUENIL) 200 MG tablet TAKE 1 TABLET BY MOUTH EVERY DAY (Patient not taking: Reported on 06/08/2018) 30 tablet 0  . lactulose (CHRONULAC) 10 GM/15ML solution TAKE 15 MLS BY MOUTH THREE TIMES A DAY 946 mL 1  . lisinopril (PRINIVIL,ZESTRIL) 20 MG tablet Take by mouth.    . methotrexate (RHEUMATREX) 2.5 MG tablet Take 4 tablets by mouth once a week. Increase to 6 tablets once per week if labs are stable. Caution:Chemotherapy. Protect from light. (Patient not taking: Reported on 08/25/2017) 20 tablet 0  . metroNIDAZOLE (FLAGYL) 500 MG tablet Take by mouth.    . mirtazapine (REMERON) 15 MG tablet Take 1 tablet (15 mg total) by mouth at bedtime. (Patient not taking: Reported on 06/08/2018) 30 tablet 3  . predniSONE (DELTASONE) 5 MG tablet Take 1 tablet (5 mg total) by mouth daily with breakfast. (Patient not taking: Reported on 12/21/2017) 30 tablet 0  . rifampin (RIFADIN) 150 MG capsule Take 300 mg by mouth daily.    Marland Kitchen sulfamethoxazole-trimethoprim (BACTRIM DS,SEPTRA DS) 800-160 MG tablet Take by mouth.    . terbinafine (LAMISIL AT) 1 % cream Apply 1 application topically 2 (two) times daily. (Patient not taking: Reported on 08/03/2017) 30 g 2  .  traMADol (ULTRAM) 50 MG tablet Take 1 tablet (50 mg total) by mouth every 12 (twelve) hours as needed. (Patient not taking: Reported on 12/21/2017) 60 tablet 2   Facility-Administered Medications Prior to Visit  Medication Dose Route Frequency Provider Last Rate Last Dose  . 0.9 %  sodium chloride infusion  500 mL Intravenous Continuous Gatha Mayer, MD        O:  No objective readings taken during today's visit.  A/P: Medication reconciliation performed. Medication list updated. I have listed medications that the patient is taking below. Of note, I have continued patient's carvedilol and ordered refills.  I instructed the patient's grandson to hold pravastatin for now until having a discussion with Dr. Margarita Rana next month.      Current Outpatient Medications (Cardiovascular):  .  amLODipine (NORVASC) 10 MG tablet, Take 1 tablet (10 mg total) by mouth daily. .  carvedilol (COREG) 6.25 MG tablet, TAKE 1 TABLET BY MOUTH 2 TIMES A DAY WITH A MEAL (Patient not taking: Reported on 06/15/2018) .  pravastatin (PRAVACHOL) 20 MG tablet, Take 1 tablet (20 mg total) by mouth daily. (Patient not taking: Reported on 12/21/2017)   Current Outpatient Medications (Respiratory):  .  cetirizine (ZYRTEC) 10 MG tablet, TAKE 1 TABLET BY MOUTH ONCE DAILY .  fluticasone (FLONASE) 50 MCG/ACT nasal spray, INSTILL 2 SPRAYS INTO EACH NOSTRIL DAILY   Current Outpatient Medications (Analgesics):  .  leflunomide (ARAVA) 10 MG tablet, Take 10 mg by mouth daily.   Current Outpatient Medications (Hematological):  .  folic acid (FOLVITE) 294 MCG tablet, Take 1 tablet (400 mcg total) by mouth daily.   Current Outpatient Medications (Other):  .  tiZANidine (ZANAFLEX) 4 MG tablet, TAKE 1 TABLET BY MOUTH EVERY 12 HOURS AS NEEDED FOR MUSCLE SPASMS  Current Facility-Administered Medications (Other):  .  0.9 %  sodium chloride infusion  Results reviewed and written information provided. Total time in face-to-face counseling 15 minutes.   F/U Clinic Visit next month with PCP.    Patient seen with: Beckey Rutter, PharmD Candidate  [employer]  [employer]  Benard Halsted, PharmD, [place] [phone]

## 2018-06-21 DIAGNOSIS — R768 Other specified abnormal immunological findings in serum: Secondary | ICD-10-CM | POA: Diagnosis not present

## 2018-06-30 ENCOUNTER — Telehealth: Payer: Self-pay | Admitting: Family Medicine

## 2018-06-30 NOTE — Telephone Encounter (Signed)
Paperwork is on PCP desk waiting for signature.

## 2018-06-30 NOTE — Telephone Encounter (Signed)
C[facility]ina from [facility] called to request an update on a fax she sent on 06/24/2018 concerning orders for a hospital bed, please follow up as soon as possible [phone] ext. [place]

## 2018-07-05 NOTE — Telephone Encounter (Signed)
Follow up   Caryl Pina from [facility] is calling to check on the status for the order request from 06/24/2018. Please follow up

## 2018-07-05 NOTE — Telephone Encounter (Signed)
Caryl Pina was called and a voicemail was left informing her to contact me.

## 2018-07-07 NOTE — Telephone Encounter (Signed)
Follow up  Caryl Pina from [facility] is returning your call

## 2018-07-07 NOTE — Telephone Encounter (Signed)
Call was returned and a voicemail was left informing patient to return phone call.

## 2018-07-18 DIAGNOSIS — S72002D Fracture of unspecified part of neck of left femur, subsequent encounter for closed fracture with routine healing: Secondary | ICD-10-CM | POA: Diagnosis not present

## 2018-07-18 DIAGNOSIS — S42202P Unspecified fracture of upper end of left humerus, subsequent encounter for fracture with malunion: Secondary | ICD-10-CM | POA: Diagnosis not present

## 2018-07-18 DIAGNOSIS — S42202D Unspecified fracture of upper end of left humerus, subsequent encounter for fracture with routine healing: Secondary | ICD-10-CM | POA: Diagnosis not present

## 2018-07-19 ENCOUNTER — Other Ambulatory Visit: Payer: Self-pay | Admitting: Family Medicine

## 2018-07-19 DIAGNOSIS — R0982 Postnasal drip: Secondary | ICD-10-CM

## 2018-07-20 ENCOUNTER — Encounter: Payer: Self-pay | Admitting: Family Medicine

## 2018-07-20 ENCOUNTER — Ambulatory Visit: Payer: Medicare Other | Attending: Family Medicine | Admitting: Family Medicine

## 2018-07-20 VITALS — BP 138/76 | HR 69 | Temp 98.6°F | Ht 62.0 in | Wt 116.0 lb

## 2018-07-20 DIAGNOSIS — Z7901 Long term (current) use of anticoagulants: Secondary | ICD-10-CM | POA: Diagnosis not present

## 2018-07-20 DIAGNOSIS — R630 Anorexia: Secondary | ICD-10-CM | POA: Diagnosis not present

## 2018-07-20 DIAGNOSIS — Z79899 Other long term (current) drug therapy: Secondary | ICD-10-CM | POA: Diagnosis not present

## 2018-07-20 DIAGNOSIS — M0579 Rheumatoid arthritis with rheumatoid factor of multiple sites without organ or systems involvement: Secondary | ICD-10-CM | POA: Diagnosis not present

## 2018-07-20 DIAGNOSIS — J45909 Unspecified asthma, uncomplicated: Secondary | ICD-10-CM | POA: Insufficient documentation

## 2018-07-20 DIAGNOSIS — E785 Hyperlipidemia, unspecified: Secondary | ICD-10-CM | POA: Insufficient documentation

## 2018-07-20 DIAGNOSIS — M109 Gout, unspecified: Secondary | ICD-10-CM | POA: Insufficient documentation

## 2018-07-20 DIAGNOSIS — E7800 Pure hypercholesterolemia, unspecified: Secondary | ICD-10-CM | POA: Diagnosis not present

## 2018-07-20 DIAGNOSIS — G4700 Insomnia, unspecified: Secondary | ICD-10-CM | POA: Insufficient documentation

## 2018-07-20 DIAGNOSIS — Z1239 Encounter for other screening for malignant neoplasm of breast: Secondary | ICD-10-CM

## 2018-07-20 DIAGNOSIS — Z122 Encounter for screening for malignant neoplasm of respiratory organs: Secondary | ICD-10-CM

## 2018-07-20 DIAGNOSIS — R21 Rash and other nonspecific skin eruption: Secondary | ICD-10-CM | POA: Diagnosis not present

## 2018-07-20 DIAGNOSIS — M069 Rheumatoid arthritis, unspecified: Secondary | ICD-10-CM | POA: Diagnosis not present

## 2018-07-20 DIAGNOSIS — Z87891 Personal history of nicotine dependence: Secondary | ICD-10-CM | POA: Diagnosis not present

## 2018-07-20 DIAGNOSIS — Z1231 Encounter for screening mammogram for malignant neoplasm of breast: Secondary | ICD-10-CM | POA: Diagnosis not present

## 2018-07-20 DIAGNOSIS — I10 Essential (primary) hypertension: Secondary | ICD-10-CM | POA: Diagnosis not present

## 2018-07-20 MED ORDER — FOLIC ACID 400 MCG PO TABS: 400.0000 ug | ORAL_TABLET | Freq: Every day | ORAL | 1 refills | Status: DC

## 2018-07-20 MED ORDER — CARVEDILOL 6.25 MG PO TABS: ORAL_TABLET | ORAL | 1 refills | Status: DC

## 2018-07-20 MED ORDER — AMLODIPINE BESYLATE 10 MG PO TABS: 10.0000 mg | ORAL_TABLET | Freq: Every day | ORAL | 1 refills | Status: DC

## 2018-07-20 MED ORDER — HYDROCORTISONE 1 % EX OINT: 1.0000 "application " | TOPICAL_OINTMENT | Freq: Two times a day (BID) | CUTANEOUS | 1 refills | Status: DC

## 2018-07-20 MED ORDER — PRAVASTATIN SODIUM 20 MG PO TABS: 20.0000 mg | ORAL_TABLET | Freq: Every day | ORAL | 5 refills | Status: DC

## 2018-07-20 MED ORDER — MIRTAZAPINE 15 MG PO TABS: 15.0000 mg | ORAL_TABLET | Freq: Every day | ORAL | 3 refills | Status: DC

## 2018-07-20 NOTE — Progress Notes (Signed)
Subjective:  Patient ID: Kristy Oliver, female    DOB: [birth year]  Age: 73 y.o. MRN: [id number]  CC: Hypertension  HPI Kristy Oliver is a 73 year old female with a past medical history of hypertensiton and rheumatoid arthritis, osteoporosis, left hip, left humeral fracture status post nailing of left hip in 03/2018 who presents today accompanied by her grandson for a follow-up visit.       She has a rash on her right foot. Which was noticed a week ago and is associated with itching.  She applied a cream to which she thinks is hydrocortisone and this brought about some relief. The patient actually stated initially  rash had been present for 2 months however her grandson states this has been present for just a week as he helps with her grooming and never noticed the presence of the rash.  He did note that rash was initially slightly erythematous with some weeping but then application of the cream helped it dry off.    Left Hip & Elbow Fracture: She says that ortho has referred her to outpatient rehab.  Last visit orthopedics was 2 days ago and follow-up as needed recommended.  Hypertension: Today BP is 138/76  She is compliant with her medications but not with low sodium diet.  As per grandson she has a reduced appetite and has lost weight and this is concerning to him but the patient does not seem to be concerned. She does have a greater than 30-year pack history of smoking 1 pack of cigarettes per day and quit several years ago.  Rheumatoid Arteritis: Currently on leflunomide which is prescribed by [facility].    Health Maintenance: Needs Mammogram; Tetanus. Low dose chest x ray for smoking history.     Past Medical History:  Diagnosis Date  . Allergy   . Arthritis   . Asthma    as young person, none currently  . Facial cellulitis 11/17/2012  . Gout   . Helicobacter pylori gastritis 12/13/2016  . HTN (hypertension), malignant 11/17/2012  . Hx of adenomatous colonic  polyps 12/29/2016  . Hyperlipidemia    medicine currently  . Hypertension   . Osteoporosis   . Renal disorder   . Tuberculosis    pt took meds for 9 months - finish meds last week     Past Surgical History:  Procedure Laterality Date  . BREAST LUMPECTOMY    . c-section x 2    . COLONOSCOPY    . JOINT REPLACEMENT     left knee & left shoulder  . left breast cyst removal    . UPPER GASTROINTESTINAL ENDOSCOPY    . WRIST SURGERY  2018   x2    Family History  Problem Relation Age of Onset  . Colon cancer Neg Hx   . Esophageal cancer Neg Hx   . Rectal cancer Neg Hx   . Stomach cancer Neg Hx     Allergies  Allergen Reactions  . Clindamycin Hives, Rash and Swelling  . Iodinated Diagnostic Agents     unknown  . Metrizamide     unknown  . Motrin [Ibuprofen] Other (See Comments)    blackout States she is only allergic to 800 mg formulation  . Milk-Related Compounds Diarrhea  . Shrimp [Shellfish Allergy] Hives, Swelling and Rash    Outpatient Medications Prior to Visit  Medication Sig Dispense Refill  . amLODipine (NORVASC) 10 MG tablet Take 1 tablet (10 mg total) by mouth daily. 90 tablet 0  .  carvedilol (COREG) 6.25 MG tablet TAKE 1 TABLET BY MOUTH 2 TIMES A DAY WITH A MEAL (Patient not taking: Reported on 06/15/2018) 60 tablet 2  . cetirizine (ZYRTEC) 10 MG tablet TAKE 1 TABLET BY MOUTH ONCE DAILY 30 tablet 1  . fluticasone (FLONASE) 50 MCG/ACT nasal spray INSTILL 2 SPRAYS INTO EACH NOSTRIL DAILY 16 g 2  . folic acid (FOLVITE) 716 MCG tablet Take 1 tablet (400 mcg total) by mouth daily. 90 tablet 0  . leflunomide (ARAVA) 10 MG tablet Take 10 mg by mouth daily.    . pravastatin (PRAVACHOL) 20 MG tablet Take 1 tablet (20 mg total) by mouth daily. (Patient not taking: Reported on 12/21/2017) 30 tablet 5  . tiZANidine (ZANAFLEX) 4 MG tablet TAKE 1 TABLET BY MOUTH EVERY 12 HOURS AS NEEDED FOR MUSCLE SPASMS 60 tablet 2   Facility-Administered Medications Prior to Visit    Medication Dose Route Frequency Provider Last Rate Last Dose  . 0.9 %  sodium chloride infusion  500 mL Intravenous Continuous Gatha Mayer, MD         ROS Review of Systems  Constitutional: Positive for appetite change and unexpected weight change. Negative for activity change, chills, fatigue and fever.       She does not have much of appetite, so she does not eat much  Eyes: Negative for visual disturbance.  Respiratory: Negative for cough, shortness of breath and wheezing.   Cardiovascular: Negative for chest pain, palpitations and leg swelling.  Gastrointestinal: Negative for abdominal distention and abdominal pain.  Musculoskeletal: Negative for arthralgias, gait problem, joint swelling and myalgias.  Skin: Positive for rash.       Complains of pruritic rash to right lower extremity  Neurological: Negative for facial asymmetry, speech difficulty and numbness.  Psychiatric/Behavioral: Negative for agitation, behavioral problems and confusion.    Objective:    BP/Weight 06/08/2018 9/67/8938 1/0/1751  Systolic BP 025 852 778  Diastolic BP 72 70 89  Wt. (Lbs) 120.4 116 118  BMI 22.02 21.22 21.58      Physical Exam Constitutional:      Appearance: Normal appearance. She is not ill-appearing.  HENT:     Head: Normocephalic and atraumatic.  Eyes:     Extraocular Movements: Extraocular movements intact.     Pupils: Pupils are equal, round, and reactive to light.  Neck:     Vascular: No carotid bruit.  Cardiovascular:     Rate and Rhythm: Normal rate and regular rhythm.     Pulses: Normal pulses.     Heart sounds: Normal heart sounds. No murmur. No friction rub. No gallop.   Pulmonary:     Effort: Pulmonary effort is normal.     Breath sounds: Normal breath sounds. No wheezing, rhonchi or rales.  Abdominal:     General: Abdomen is flat. Bowel sounds are normal.     Palpations: Abdomen is soft. There is no mass.  Musculoskeletal: Normal range of motion.         General: Deformity present. No swelling or tenderness.     Comments: Advance rheumatoid disease - Boutonniere deformity, ulnar deviation of metacarpophalengeal joints, swan-neck deformities of fingers to bilateral upper extremities.   Skin:    General: Skin is warm and dry.     Capillary Refill: Capillary refill takes less than 2 seconds.     Findings: Rash (6x4cm along vertical axis) present. Rash is scaling.     Comments: Lichenification 6cm x 4cm Dry Healing   Neurological:  General: No focal deficit present.     Mental Status: She is alert and oriented to person, place, and time.  Psychiatric:        Mood and Affect: Mood normal.        Behavior: Behavior normal.        Thought Content: Thought content normal.        Judgment: Judgment normal.     CMP Latest Ref Rng & Units 08/20/2017 08/03/2017 04/13/2017  Glucose 65 - 99 mg/dL 74 86 -  BUN 7 - 25 mg/dL 10 10 -  Creatinine 0.50 - 0.99 mg/dL 0.68 0.68 -  Sodium 135 - 146 mmol/L 137 139 -  Potassium 3.5 - 5.3 mmol/L 4.5 4.2 -  Chloride 98 - 110 mmol/L 106 110 -  CO2 20 - 32 mmol/L 26 22 -  Calcium 8.6 - 10.4 mg/dL 9.5 9.3 -  Total Protein 6.1 - 8.1 g/dL 8.9(H) 9.1(H) 9.2(H)  Total Bilirubin 0.2 - 1.2 mg/dL 0.6 0.5 -  Alkaline Phos 39 - 117 IU/L - - -  AST 10 - 35 U/L 39(H) 45(H) -  ALT 6 - 29 U/L 28 32(H) -    Lipid Panel     Component Value Date/Time   CHOL 250 (H) 07/02/2015 0915   TRIG 102 07/02/2015 0915   HDL 96 07/02/2015 0915   CHOLHDL 2.6 07/02/2015 0915   VLDL 20 07/02/2015 0915   LDLCALC 134 (H) 07/02/2015 0915    CBC    Component Value Date/Time   WBC 7.7 08/20/2017 1526   RBC 4.51 08/20/2017 1526   HGB 13.8 08/20/2017 1526   HGB 14.5 04/13/2017 1720   HCT 40.2 08/20/2017 1526   HCT 41.6 04/13/2017 1720   PLT 317 08/20/2017 1526   PLT 284 04/13/2017 1720   MCV 89.1 08/20/2017 1526   MCV 89 04/13/2017 1720   MCH 30.6 08/20/2017 1526   MCHC 34.3 08/20/2017 1526   RDW 13.0 08/20/2017 1526   RDW  14.0 04/13/2017 1720   LYMPHSABS 1,948 08/20/2017 1526   LYMPHSABS 1.7 04/13/2017 1720   MONOABS 1,197 (H) 05/11/2016 0816   EOSABS 270 08/20/2017 1526   EOSABS 0.3 04/13/2017 1720   BASOSABS 77 08/20/2017 1526   BASOSABS 0.1 04/13/2017 1720    Lab Results  Component Value Date   HGBA1C 5.5 11/02/2016     Assessment & Plan:   1. History of smoking 30 or more pack years - She says she has smoked since the age of 3; she has since quit October 2019. Order low dose chest x-ray screening for lung carcinoma.   2. Pure hypercholesterolemia - Discussed  low cholesterol diet - Continue current medication regimen.  Needs a lipid panel - next visit  3. Essential hypertension Controlled  - Order CMP + GFR  - Continue current medication regimen.  - Dietary approaches to stop hypertension discussed  4. Screening for breast cancer - Ambulatory referral for mammogram  5. Encounter for screening for lung cancer - please see Plan #1 above.  6.  Rash Unclear etiology We will prescribe hydrocortisone  7.Rheumatoid arthritis Continue leflunomide as per rheumatology.  8.  Decreased appetite Commence Remeron which will also help with insomnia.  Follow-up: No follow-ups on file. Follow-Up in 3 months for chronic medical conditions.       Charlott Rakes, MD, FAAFP. [facility] and [place], [place]   07/20/2018, 8:55 AM

## 2018-07-20 NOTE — Telephone Encounter (Signed)
Kristy Oliver was called and informed that patient does not need the equipment per her grandson.

## 2018-07-20 NOTE — Telephone Encounter (Signed)
Follow up   Caryl Pina from [facility] is calling to check on the status of the pts order request form 1/31 Please follow up

## 2018-07-21 LAB — CBC WITH DIFFERENTIAL/PLATELET
Basophils Absolute: 0.1 10*3/uL (ref 0.0–0.2)
Basos: 1 %
EOS (ABSOLUTE): 0.2 10*3/uL (ref 0.0–0.4)
EOS: 2 %
HEMATOCRIT: 50.2 % — AB (ref 34.0–46.6)
Hemoglobin: 16.1 g/dL — ABNORMAL HIGH (ref 11.1–15.9)
IMMATURE GRANULOCYTES: 0 %
Immature Grans (Abs): 0 10*3/uL (ref 0.0–0.1)
Lymphocytes Absolute: 1.3 10*3/uL (ref 0.7–3.1)
Lymphs: 12 %
MCH: 29.4 pg (ref 26.6–33.0)
MCHC: 32.1 g/dL (ref 31.5–35.7)
MCV: 92 fL (ref 79–97)
MONOS ABS: 0.7 10*3/uL (ref 0.1–0.9)
Monocytes: 7 %
Neutrophils Absolute: 8.5 10*3/uL — ABNORMAL HIGH (ref 1.4–7.0)
Neutrophils: 78 %
Platelets: 231 10*3/uL (ref 150–450)
RBC: 5.48 x10E6/uL — ABNORMAL HIGH (ref 3.77–5.28)
RDW: 13 % (ref 11.7–15.4)
WBC: 10.9 10*3/uL — ABNORMAL HIGH (ref 3.4–10.8)

## 2018-07-21 LAB — CMP14+EGFR
ALT: 32 IU/L (ref 0–32)
AST: 45 IU/L — ABNORMAL HIGH (ref 0–40)
Albumin/Globulin Ratio: 0.8 — ABNORMAL LOW (ref 1.2–2.2)
Albumin: 4 g/dL (ref 3.8–4.8)
Alkaline Phosphatase: 400 IU/L — ABNORMAL HIGH (ref 39–117)
BUN/Creatinine Ratio: 18 (ref 12–28)
BUN: 12 mg/dL (ref 8–27)
Bilirubin Total: 0.5 mg/dL (ref 0.0–1.2)
CO2: 19 mmol/L — AB (ref 20–29)
Calcium: 10.2 mg/dL (ref 8.7–10.3)
Chloride: 103 mmol/L (ref 96–106)
Creatinine, Ser: 0.68 mg/dL (ref 0.57–1.00)
GFR calc Af Amer: 102 mL/min/{1.73_m2} (ref >59)
GFR calc non Af Amer: 89 mL/min/{1.73_m2} (ref >59)
GLOBULIN, TOTAL: 5.3 g/dL — AB (ref 1.5–4.5)
Glucose: 92 mg/dL (ref 65–99)
Potassium: 4.6 mmol/L (ref 3.5–5.2)
Sodium: 139 mmol/L (ref 134–144)
Total Protein: 9.3 g/dL — ABNORMAL HIGH (ref 6.0–8.5)

## 2018-07-26 ENCOUNTER — Ambulatory Visit (HOSPITAL_COMMUNITY): Payer: Medicare Other | Attending: Family Medicine

## 2018-07-26 ENCOUNTER — Other Ambulatory Visit: Payer: Self-pay | Admitting: Family Medicine

## 2018-07-26 DIAGNOSIS — R748 Abnormal levels of other serum enzymes: Secondary | ICD-10-CM

## 2018-07-29 ENCOUNTER — Telehealth: Payer: Self-pay

## 2018-07-29 NOTE — Telephone Encounter (Signed)
-----   Message from Charlott Rakes, MD sent at 07/26/2018  8:24 AM EST ----- Labs revealed severely elevated alkaline phosphatase which could be due to her rheumatoid versus her bone pains and recent surgery.  Could you please schedule her for repeat labs in 2 weeks to follow-up on this?  Thank you

## 2018-07-29 NOTE — Telephone Encounter (Signed)
Patient was called and informed of lab results and appointment has been scheduled for follow up labs.

## 2018-08-01 DIAGNOSIS — Z9181 History of falling: Secondary | ICD-10-CM | POA: Diagnosis not present

## 2018-08-01 DIAGNOSIS — M80022D Age-related osteoporosis with current pathological fracture, left humerus, subsequent encounter for fracture with routine healing: Secondary | ICD-10-CM | POA: Diagnosis not present

## 2018-08-01 DIAGNOSIS — M80012D Age-related osteoporosis with current pathological fracture, left shoulder, subsequent encounter for fracture with routine healing: Secondary | ICD-10-CM | POA: Diagnosis not present

## 2018-08-01 DIAGNOSIS — M80052D Age-related osteoporosis with current pathological fracture, left femur, subsequent encounter for fracture with routine healing: Secondary | ICD-10-CM | POA: Diagnosis not present

## 2018-08-04 ENCOUNTER — Ambulatory Visit (HOSPITAL_COMMUNITY): Admission: RE | Admit: 2018-08-04 | Payer: Medicare Other | Source: Ambulatory Visit

## 2018-08-11 ENCOUNTER — Ambulatory Visit (HOSPITAL_COMMUNITY): Payer: Medicare Other

## 2018-08-25 ENCOUNTER — Other Ambulatory Visit: Payer: Self-pay | Admitting: Family Medicine

## 2018-08-25 DIAGNOSIS — G8929 Other chronic pain: Secondary | ICD-10-CM

## 2018-08-25 DIAGNOSIS — M546 Pain in thoracic spine: Principal | ICD-10-CM

## 2018-09-13 DIAGNOSIS — M0579 Rheumatoid arthritis with rheumatoid factor of multiple sites without organ or systems involvement: Secondary | ICD-10-CM | POA: Diagnosis not present

## 2018-09-13 DIAGNOSIS — M1A09X0 Idiopathic chronic gout, multiple sites, without tophus (tophi): Secondary | ICD-10-CM | POA: Diagnosis not present

## 2018-09-13 DIAGNOSIS — R7612 Nonspecific reaction to cell mediated immunity measurement of gamma interferon antigen response without active tuberculosis: Secondary | ICD-10-CM | POA: Diagnosis not present

## 2018-09-23 ENCOUNTER — Other Ambulatory Visit: Payer: Self-pay | Admitting: Family Medicine

## 2018-09-23 DIAGNOSIS — J328 Other chronic sinusitis: Secondary | ICD-10-CM

## 2018-10-19 ENCOUNTER — Other Ambulatory Visit: Payer: Self-pay | Admitting: Family Medicine

## 2018-10-19 DIAGNOSIS — R21 Rash and other nonspecific skin eruption: Secondary | ICD-10-CM

## 2018-10-19 DIAGNOSIS — R0982 Postnasal drip: Secondary | ICD-10-CM

## 2018-10-21 ENCOUNTER — Telehealth: Payer: Self-pay | Admitting: Family Medicine

## 2018-10-21 ENCOUNTER — Other Ambulatory Visit: Payer: Self-pay | Admitting: Family Medicine

## 2018-10-21 DIAGNOSIS — R21 Rash and other nonspecific skin eruption: Secondary | ICD-10-CM

## 2018-10-21 MED ORDER — HYDROCORTISONE 1 % EX OINT: TOPICAL_OINTMENT | Freq: Two times a day (BID) | CUTANEOUS | 1 refills | Status: DC

## 2018-10-21 NOTE — Telephone Encounter (Signed)
Kristy Oliver was called and informed that a new script will be sent over for ointment.

## 2018-10-21 NOTE — Telephone Encounter (Signed)
Nicki called to get clarification for the hydrocortisone. Please follow up.

## 2018-10-25 ENCOUNTER — Other Ambulatory Visit: Payer: Self-pay

## 2018-10-25 ENCOUNTER — Ambulatory Visit: Payer: Medicare Other | Attending: Family Medicine | Admitting: Family Medicine

## 2018-10-25 DIAGNOSIS — M0579 Rheumatoid arthritis with rheumatoid factor of multiple sites without organ or systems involvement: Secondary | ICD-10-CM

## 2018-10-25 DIAGNOSIS — R21 Rash and other nonspecific skin eruption: Secondary | ICD-10-CM | POA: Diagnosis not present

## 2018-10-25 DIAGNOSIS — G8929 Other chronic pain: Secondary | ICD-10-CM

## 2018-10-25 DIAGNOSIS — M546 Pain in thoracic spine: Secondary | ICD-10-CM | POA: Diagnosis not present

## 2018-10-25 DIAGNOSIS — E7800 Pure hypercholesterolemia, unspecified: Secondary | ICD-10-CM

## 2018-10-25 DIAGNOSIS — I10 Essential (primary) hypertension: Secondary | ICD-10-CM

## 2018-10-25 MED ORDER — MIRTAZAPINE 15 MG PO TABS: 15.0000 mg | ORAL_TABLET | Freq: Every day | ORAL | 3 refills | Status: DC

## 2018-10-25 MED ORDER — ALENDRONATE SODIUM 70 MG PO TABS: 70.0000 mg | ORAL_TABLET | ORAL | 6 refills | Status: DC

## 2018-10-25 MED ORDER — TIZANIDINE HCL 4 MG PO TABS: ORAL_TABLET | ORAL | 2 refills | Status: DC

## 2018-10-25 MED ORDER — CARVEDILOL 6.25 MG PO TABS: ORAL_TABLET | ORAL | 1 refills | Status: DC

## 2018-10-25 MED ORDER — PRAVASTATIN SODIUM 20 MG PO TABS: 20.0000 mg | ORAL_TABLET | Freq: Every day | ORAL | 5 refills | Status: DC

## 2018-10-25 MED ORDER — AMLODIPINE BESYLATE 10 MG PO TABS: 10.0000 mg | ORAL_TABLET | Freq: Every day | ORAL | 1 refills | Status: DC

## 2018-10-25 MED ORDER — PREDNISONE 20 MG PO TABS: 20.0000 mg | ORAL_TABLET | Freq: Two times a day (BID) | ORAL | 0 refills | Status: DC

## 2018-10-25 NOTE — Progress Notes (Signed)
Patient has been called and DOB has been verified. Patient has been screened and transferred to PCP to start phone visit.   Patient states that she is having stiffness in legs and shoulders.

## 2018-10-25 NOTE — Progress Notes (Signed)
Virtual Visit via Telephone Note  I connected with [name], on 10/25/2018 at 9:37 AM by telephone due to the COVID-19 pandemic and verified that I am speaking with the correct person using two identifiers.   Consent: I discussed the limitations, risks, security and privacy concerns of performing an evaluation and management service by telephone and the availability of in person appointments. I also discussed with the patient that there may be a patient responsible charge related to this service. The patient expressed understanding and agreed to proceed.   Location of Patient: Patient's home  Location of Provider: Clinic   Persons participating in Telemedicine visit: Elany Felix - grandson Benita Gutter - daughter Ozzie Hoyle Dr. Felecia Shelling     History of Present Illness: Kristy Oliver is a 73 year old female with a past medical history of hypertensiton and rheumatoid arthritis, osteoporosis, left hip, left humeral fracture status post nailing of left hip in 03/2018 who presents today for follow-up visit.  The patient informs me she is concerned about her nose dripping for the last 2 days with intermittent stuffiness but no sinus pressure. Her grandson interjects and complains of the nodules on his grandma's joints which have increased in number.  The nodules are sometimes painful and he is unable to recall her last visit to rheumatology however on review of care everywhere, she had an office visit on 09/13/2018 at [facility] rheumatology, [place] and should be taking [place] which both the patient and her grandson are not sure she has been taking but then goes on to inform me she has been taking all the medications she has been prescribed.  Endorses stiffness in her joints, shoulders, legs, back. He is unhappy his grandma is not being seen in person and also that he was not given the option of a virtual visit as he does have a smart phone. She also  has a rash on her foot which is pruritic and has been present for the last 3 months.  She was treated with hydrocortisone at her last office visit but this has not been effective.     Past Medical History:  Diagnosis Date  . Allergy   . Arthritis   . Asthma    as young person, none currently  . Facial cellulitis 11/17/2012  . Gout   . Helicobacter pylori gastritis 12/13/2016  . HTN (hypertension), malignant 11/17/2012  . Hx of adenomatous colonic polyps 12/29/2016  . Hyperlipidemia    medicine currently  . Hypertension   . Osteoporosis   . Renal disorder   . Tuberculosis    pt took meds for 9 months - finish meds last week    Allergies  Allergen Reactions  . Clindamycin Hives, Rash and Swelling  . Iodinated Diagnostic Agents     unknown  . Metrizamide     unknown  . Motrin [Ibuprofen] Other (See Comments)    blackout States she is only allergic to 800 mg formulation  . Milk-Related Compounds Diarrhea  . Shrimp [Shellfish Allergy] Hives, Swelling and Rash    Current Outpatient Medications on File Prior to Visit  Medication Sig Dispense Refill  . amLODipine (NORVASC) 10 MG tablet Take 1 tablet (10 mg total) by mouth daily. 90 tablet 1  . carvedilol (COREG) 6.25 MG tablet TAKE 1 TABLET BY MOUTH 2 TIMES A DAY WITH A MEAL 180 tablet 1  . cetirizine (ZYRTEC) 10 MG tablet TAKE 1 TABLET BY MOUTH ONCE DAILY 30 tablet 2  . fluticasone (FLONASE)  50 MCG/ACT nasal spray INSTILL 2 SPRAYS INTO EACH NOSTRIL DAILY 16 g 2  . folic acid (FOLVITE) 010 MCG tablet Take 1 tablet (400 mcg total) by mouth daily. 90 tablet 1  . hydrocortisone 1 % ointment Apply topically 2 (two) times daily. 56 g 1  . leflunomide (ARAVA) 10 MG tablet Take 10 mg by mouth daily.    . mirtazapine (REMERON) 15 MG tablet Take 1 tablet (15 mg total) by mouth at bedtime. 30 tablet 3  . pravastatin (PRAVACHOL) 20 MG tablet Take 1 tablet (20 mg total) by mouth daily. 30 tablet 5  . tiZANidine (ZANAFLEX) 4 MG tablet TAKE 1  TABLET BY MOUTH EVERY 12 HOURS AS NEEDED FOR MUSCLE SPASMS 60 tablet 2   Current Facility-Administered Medications on File Prior to Visit  Medication Dose Route Frequency Provider Last Rate Last Dose  . 0.9 %  sodium chloride infusion  500 mL Intravenous Continuous Gatha Mayer, MD        Observations/Objective: Awake, alert, oriented x3 Not in acute distress  Assessment and Plan: 1. Rheumatoid arthritis involving multiple sites with positive rheumatoid factor ([place]) I have provided the number to her endocrinologist and he will be getting in touch for an appointment meanwhile short course of prednisone Explained she has rheumatoid nodules from her end-stage rheumatoid arthritis - predniSONE (DELTASONE) 20 MG tablet; Take 1 tablet (20 mg total) by mouth 2 (two) times daily with a meal.  Dispense: 10 tablet; Refill: 0  2. Essential hypertension Stable Counseled on blood pressure goal of less than 130/80, low-sodium, DASH diet, medication compliance, 150 minutes of moderate intensity exercise per week. Discussed medication compliance, adverse effects. - amLODipine (NORVASC) 10 MG tablet; Take 1 tablet (10 mg total) by mouth daily.  Dispense: 90 tablet; Refill: 1 - carvedilol (COREG) 6.25 MG tablet; TAKE 1 TABLET BY MOUTH 2 TIMES A DAY WITH A MEAL  Dispense: 180 tablet; Refill: 1  3. Rash Uncontrolled Advised to take a picture of the rash and sent to me via my chart I will bring her in for an in person visit to evaluate  4. Pure hypercholesterolemia Stable - pravastatin (PRAVACHOL) 20 MG tablet; Take 1 tablet (20 mg total) by mouth daily.  Dispense: 30 tablet; Refill: 5  5. 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  It also appears she has not been taking Fosamax as it  does not appear on her med list.  I have sent a refill of this to the pharmacy for her.  Follow Up Instructions: I will see her in 2 days to further evaluate her symptoms.  I explained to the patient and family members on the call that this patient is high risk and this visit was conducted due to the ongoing COVID-19 pandemic to prevent unnecessary exposure to the patient given she is immunocompromised.   I discussed the assessment and treatment plan with the patient. The patient was provided an opportunity to ask questions and all were answered. The patient agreed with the plan and demonstrated an understanding of the instructions.   The patient was advised to call back or seek an in-person evaluation if the symptoms worsen or if the condition fails to improve as anticipated.     I provided 18 minutes total of  non-face-to-face time during this encounter including median intraservice time, reviewing previous notes, labs, imaging, medications, management and patient verbalized understanding.     Charlott Rakes, MD, FAAFP. [facility] and [place], [place]   10/25/2018, 9:37 AM

## 2018-10-27 ENCOUNTER — Other Ambulatory Visit: Payer: Self-pay

## 2018-10-27 ENCOUNTER — Encounter: Payer: Self-pay | Admitting: Family Medicine

## 2018-10-27 ENCOUNTER — Ambulatory Visit: Payer: Medicare Other | Attending: Family Medicine | Admitting: Family Medicine

## 2018-10-27 VITALS — BP 144/74 | HR 62 | Temp 98.2°F | Ht 62.0 in | Wt 117.0 lb

## 2018-10-27 DIAGNOSIS — E785 Hyperlipidemia, unspecified: Secondary | ICD-10-CM | POA: Diagnosis not present

## 2018-10-27 DIAGNOSIS — Z7901 Long term (current) use of anticoagulants: Secondary | ICD-10-CM | POA: Diagnosis not present

## 2018-10-27 DIAGNOSIS — M0630 Rheumatoid nodule, unspecified site: Secondary | ICD-10-CM | POA: Diagnosis not present

## 2018-10-27 DIAGNOSIS — L0100 Impetigo, unspecified: Secondary | ICD-10-CM | POA: Diagnosis not present

## 2018-10-27 DIAGNOSIS — R21 Rash and other nonspecific skin eruption: Secondary | ICD-10-CM | POA: Diagnosis not present

## 2018-10-27 DIAGNOSIS — J45909 Unspecified asthma, uncomplicated: Secondary | ICD-10-CM | POA: Diagnosis not present

## 2018-10-27 DIAGNOSIS — M069 Rheumatoid arthritis, unspecified: Secondary | ICD-10-CM | POA: Diagnosis not present

## 2018-10-27 DIAGNOSIS — M109 Gout, unspecified: Secondary | ICD-10-CM | POA: Insufficient documentation

## 2018-10-27 DIAGNOSIS — Z79899 Other long term (current) drug therapy: Secondary | ICD-10-CM | POA: Diagnosis not present

## 2018-10-27 DIAGNOSIS — L853 Xerosis cutis: Secondary | ICD-10-CM | POA: Diagnosis not present

## 2018-10-27 DIAGNOSIS — I10 Essential (primary) hypertension: Secondary | ICD-10-CM | POA: Diagnosis not present

## 2018-10-27 MED ORDER — CEPHALEXIN 500 MG PO CAPS: 500.0000 mg | ORAL_CAPSULE | Freq: Two times a day (BID) | ORAL | 0 refills | Status: DC

## 2018-10-27 MED ORDER — TRIAMCINOLONE ACETONIDE 0.1 % EX CREA: 1.0000 "application " | TOPICAL_CREAM | Freq: Two times a day (BID) | CUTANEOUS | 1 refills | Status: DC

## 2018-10-27 NOTE — Progress Notes (Signed)
Subjective:  Patient ID: [name], female    DOB: 09-30-1945  Age: 73 y.o. MRN: [id number]  CC: Hypertension   HPI Jhene Westmoreland  is a 73 year old female with a past medical history of hypertensiton and rheumatoid arthritis, osteoporosis, left hip, left humeral fracture status post nailing of left hip in 03/2018 who presents today for follow-up visit. She had a telehealth visit with me 2 days ago during which time her grandson had complained of worsening rheumatoid nodules and unresolving rash on right foot. Rash has been present for the last 3 months and I had treated this with hydrocortisone which both she and her grandson are unsure she has been using as her daughter has been her major caregiver in the last couple of months.  She endorses itchy skin and only uses moisturizers on her skin only when she is going out. They are both unable to provide a detailed history about the rash as to whether it has been exudative or initially in a blister form and she denies presence of similar rash in other body parts. Her rheumatoid nodules are increasing in number and are nontender.  Last visit to rheumatology was in 08/2018 and she endorses remaining on Arava.   Past Medical History:  Diagnosis Date  . Allergy   . Arthritis   . Asthma    as young person, none currently  . Facial cellulitis 11/17/2012  . Gout   . Helicobacter pylori gastritis 12/13/2016  . HTN (hypertension), malignant 11/17/2012  . Hx of adenomatous colonic polyps 12/29/2016  . Hyperlipidemia    medicine currently  . Hypertension   . Osteoporosis   . Renal disorder   . Tuberculosis    pt took meds for 9 months - finish meds last week     Past Surgical History:  Procedure Laterality Date  . BREAST LUMPECTOMY    . c-section x 2    . COLONOSCOPY    . JOINT REPLACEMENT     left knee & left shoulder  . left breast cyst removal    . UPPER GASTROINTESTINAL ENDOSCOPY    . WRIST SURGERY  2018   x2    Family  History  Problem Relation Age of Onset  . Colon cancer Neg Hx   . Esophageal cancer Neg Hx   . Rectal cancer Neg Hx   . Stomach cancer Neg Hx     Allergies  Allergen Reactions  . Clindamycin Hives, Rash and Swelling  . Iodinated Diagnostic Agents     unknown  . Metrizamide     unknown  . Motrin [Ibuprofen] Other (See Comments)    blackout States she is only allergic to 800 mg formulation  . Milk-Related Compounds Diarrhea  . Shrimp [Shellfish Allergy] Hives, Swelling and Rash    Outpatient Medications Prior to Visit  Medication Sig Dispense Refill  . alendronate (FOSAMAX) 70 MG tablet Take 1 tablet (70 mg total) by mouth every 7 (seven) days. Take with a full glass of water on an empty stomach. 4 tablet 6  . amLODipine (NORVASC) 10 MG tablet Take 1 tablet (10 mg total) by mouth daily. 90 tablet 1  . carvedilol (COREG) 6.25 MG tablet TAKE 1 TABLET BY MOUTH 2 TIMES A DAY WITH A MEAL 180 tablet 1  . cetirizine (ZYRTEC) 10 MG tablet TAKE 1 TABLET BY MOUTH ONCE DAILY 30 tablet 2  . fluticasone (FLONASE) 50 MCG/ACT nasal spray INSTILL 2 SPRAYS INTO EACH NOSTRIL DAILY 16 g 2  .  folic acid (FOLVITE) 185 MCG tablet Take 1 tablet (400 mcg total) by mouth daily. 90 tablet 1  . leflunomide (ARAVA) 10 MG tablet Take 10 mg by mouth daily.    . mirtazapine (REMERON) 15 MG tablet Take 1 tablet (15 mg total) by mouth at bedtime. 30 tablet 3  . pravastatin (PRAVACHOL) 20 MG tablet Take 1 tablet (20 mg total) by mouth daily. 30 tablet 5  . predniSONE (DELTASONE) 20 MG tablet Take 1 tablet (20 mg total) by mouth 2 (two) times daily with a meal. 10 tablet 0  . tiZANidine (ZANAFLEX) 4 MG tablet TAKE 1 TABLET BY MOUTH EVERY 12 HOURS AS NEEDED FOR MUSCLE SPASMS 60 tablet 2  . hydrocortisone 1 % ointment Apply topically 2 (two) times daily. 56 g 1   Facility-Administered Medications Prior to Visit  Medication Dose Route Frequency Provider Last Rate Last Dose  . 0.9 %  sodium chloride infusion  500 mL  Intravenous Continuous Gatha Mayer, MD         ROS Review of Systems  Constitutional: Negative for activity change, appetite change and fatigue.  HENT: Negative for congestion, sinus pressure and sore throat.   Eyes: Negative for visual disturbance.  Respiratory: Negative for cough, chest tightness, shortness of breath and wheezing.   Cardiovascular: Negative for chest pain and palpitations.  Gastrointestinal: Negative for abdominal distention, abdominal pain and constipation.  Endocrine: Negative for polydipsia.  Genitourinary: Negative for dysuria and frequency.  Musculoskeletal:       See hpi  Skin: Positive for rash.  Neurological: Negative for tremors, light-headedness and numbness.  Hematological: Does not bruise/bleed easily.  Psychiatric/Behavioral: Negative for agitation and behavioral problems.    Objective:  BP (!) 144/74   Pulse 62   Temp 98.2 F (36.8 C) (Oral)   Ht 5\' 2"  (1.575 m)   Wt 117 lb (53.1 kg)   SpO2 95%   BMI 21.40 kg/m   BP/Weight 10/27/2018 07/20/2018 6/31/4970  Systolic BP 263 785 885  Diastolic BP 74 76 72  Wt. (Lbs) 117 116 120.4  BMI 21.4 21.22 22.02      Physical Exam Constitutional:      Appearance: She is well-developed.  Cardiovascular:     Rate and Rhythm: Normal rate.     Heart sounds: Normal heart sounds. No murmur.  Pulmonary:     Effort: Pulmonary effort is normal.     Breath sounds: Normal breath sounds. No wheezing or rales.  Chest:     Chest wall: No tenderness.  Abdominal:     General: Bowel sounds are normal. There is no distension.     Palpations: Abdomen is soft. There is no mass.     Tenderness: There is no abdominal tenderness.  Musculoskeletal:     Comments: Multiple rheumatoid nodules in elbows, wrists, legs, MCP joint pains diffusely distributed which are not tender  Skin:    Comments: Large dry scaly rash on anterior lateral aspect of right ankle with no exudate  Neurological:     Mental Status: She is  alert and oriented to person, place, and time.     CMP Latest Ref Rng & Units 07/20/2018 08/20/2017 08/03/2017  Glucose 65 - 99 mg/dL 92 74 86  BUN 8 - 27 mg/dL 12 10 10   Creatinine 0.57 - 1.00 mg/dL 0.68 0.68 0.68  Sodium 134 - 144 mmol/L 139 137 139  Potassium 3.5 - 5.2 mmol/L 4.6 4.5 4.2  Chloride 96 - 106 mmol/L 103 106 110  CO2 20 - 29 mmol/L 19(L) 26 22  Calcium 8.7 - 10.3 mg/dL 10.2 9.5 9.3  Total Protein 6.0 - 8.5 g/dL 9.3(H) 8.9(H) 9.1(H)  Total Bilirubin 0.0 - 1.2 mg/dL 0.5 0.6 0.5  Alkaline Phos 39 - 117 IU/L 400(H) - -  AST 0 - 40 IU/L 45(H) 39(H) 45(H)  ALT 0 - 32 IU/L 32 28 32(H)    Lipid Panel     Component Value Date/Time   CHOL 250 (H) 07/02/2015 0915   TRIG 102 07/02/2015 0915   HDL 96 07/02/2015 0915   CHOLHDL 2.6 07/02/2015 0915   VLDL 20 07/02/2015 0915   LDLCALC 134 (H) 07/02/2015 0915    CBC    Component Value Date/Time   WBC 10.9 (H) 07/20/2018 1102   WBC 7.7 08/20/2017 1526   RBC 5.48 (H) 07/20/2018 1102   RBC 4.51 08/20/2017 1526   HGB 16.1 (H) 07/20/2018 1102   HCT 50.2 (H) 07/20/2018 1102   PLT 231 07/20/2018 1102   MCV 92 07/20/2018 1102   MCH 29.4 07/20/2018 1102   MCH 30.6 08/20/2017 1526   MCHC 32.1 07/20/2018 1102   MCHC 34.3 08/20/2017 1526   RDW 13.0 07/20/2018 1102   LYMPHSABS 1.3 07/20/2018 1102   MONOABS 1,197 (H) 05/11/2016 0816   EOSABS 0.2 07/20/2018 1102   BASOSABS 0.1 07/20/2018 1102    Lab Results  Component Value Date   HGBA1C 5.5 11/02/2016    Assessment & Plan:   1. Impetigo Uncontrolled If symptoms do not improve on antibiotic will refer to dermatology - cephALEXin (KEFLEX) 500 MG capsule; Take 1 capsule (500 mg total) by mouth 2 (two) times daily.  Dispense: 20 capsule; Refill: 0  2. Xerosis of skin Advised to use moisturizer - triamcinolone cream (KENALOG) 0.1 %; Apply 1 application topically 2 (two) times daily.  Dispense: 45 g; Refill: 1  3. Rheumatoid nodule ([place]) She has end-stage rheumatoid  arthritis Encouraged to follow-up with rheumatologist and continue Arava   Meds ordered this encounter  Medications  . cephALEXin (KEFLEX) 500 MG capsule    Sig: Take 1 capsule (500 mg total) by mouth 2 (two) times daily.    Dispense:  20 capsule    Refill:  0  . triamcinolone cream (KENALOG) 0.1 %    Sig: Apply 1 application topically 2 (two) times daily.    Dispense:  45 g    Refill:  1    Follow-up: Return in about 3 months (around 01/27/2019) for medical conditions.       Charlott Rakes, MD, FAAFP. [facility] and [place], [place]   10/27/2018, 4:08 PM

## 2018-10-27 NOTE — Patient Instructions (Signed)
Impetigo, Adult Impetigo is an infection of the skin. It commonly occurs in young children, but it can also occur in adults. The infection causes itchy blisters and sores that produce brownish-yellow fluid. As the fluid dries, it forms a thick, honey-colored crust. These skin changes usually occur on the face, but they can also affect other areas of the body. Impetigo usually goes away in 7-10 days with treatment. What are the causes? This condition is caused by two types of bacteria. It may be caused by staphylococci or streptococci bacteria. These bacteria cause impetigo when they get under the surface of the skin. This often happens after some damage to the skin, such as:  Cuts, scrapes, or scratches.  Rashes.  Insect bites, especially when you scratch the area of a bite.  Chickenpox or other illnesses that cause open skin sores.  Nail biting or chewing. Impetigo can spread easily from one person to another (is contagious). It may be spread through close skin contact or by sharing towels, clothing, or other items that an infected person has touched. What increases the risk? The following factors may make you more likely to develop this condition:  Playing sports that include skin-to-skin contact with others.  Having a skin condition with open sores, such as chickenpox.  Having diabetes.  Having a weak body defense system (immune system).  Having many skin cuts or scrapes.  Living in an area that has high humidity levels.  Having poor hygiene.  Having high levels of staphylococci in your nose. What are the signs or symptoms? The main symptom of this condition is small blisters, often on the face around the mouth and nose. In time, the blisters break open and turn into tiny sores (lesions) with a yellow crust. In some cases, the blisters cause itching or burning. With scratching, irritation, or lack of treatment, these small lesions may get larger. Other possible symptoms include:   Larger blisters.  Pus.  Swollen lymph glands. Scratching the affected area can cause impetigo to spread to other parts of the body. The bacteria can get under the fingernails and spread when you touch another area of your skin. How is this diagnosed? This condition is usually diagnosed during a physical exam. A skin sample or a sample of fluid from a blister may be taken for lab tests that involve growing bacteria (culture test). Lab tests can help to confirm the diagnosis or help to determine the best treatment. How is this treated? Treatment for this condition depends on the severity of the condition:  Mild impetigo can be treated with prescription antibiotic cream.  Oral antibiotic medicine may be used in more severe cases.  Medicines that reduce itchiness (antihistamines)may also be used. Follow these instructions at home: Medicines  Take over-the-counter and prescription medicines only as told by your health care provider.  Apply or take your antibiotic as told by your health care provider. Do not stop using the antibiotic even if your condition improves. General instructions   To help prevent impetigo from spreading to other body areas: ? Keep your fingernails short and clean. ? Do not scratch the blisters or sores. ? Cover infected areas, if necessary, to keep from scratching. ? Wash your hands often with soap and warm water.  Before applying antibiotic cream or ointment, you should: ? Gently wash the infected areas with antibacterial soap and warm water. ? Soak crusted areas in warm, soapy water using antibacterial soap. ? Gently rub the areas to remove crusts. Do not  areas in warm, soapy water using antibacterial soap.  Gently rub the areas to remove crusts. Do not scrub.  Do not share towels.  Wash your clothing and bedsheets in warm water that is 140F (60C) or warmer.  Stay home until you have used an antibiotic cream for 48 hours (2 days) or an oral antibiotic medicine for 24 hours (1 day). You should only return to work and activities with other people if your skin shows significant improvement.  You may  return to contact sports after you have used antibiotic medicine for 72 hours (3 days).  Keep all follow-up visits as told by your health care provider. This is important.  How is this prevented?  Wash your hands often with soap and warm water.  Do not share towels, washcloths, clothing, bedding, or razors.  Keep your fingernails short.  Keep any cuts, scrapes, bug bites, or rashes clean and covered.  Use insect repellent to prevent bug bites.  Contact a health care provider if:  You develop more blisters or sores even with treatment.  Other family members get sores.  Your skin sores are not improving after 72 hours (3 days) of treatment.  You have a fever.  Get help right away if:  You see spreading redness or swelling of the skin around your sores.  You see red streaks coming from your sores.  You develop a sore throat.  The area around your rash becomes warm, red, or tender to the touch.  You have dark, reddish-brown urine.  You do not urinate often or you urinate small amounts.  You are very tired (lethargic).  You have swelling in the face, hands, or feet.  Summary  Impetigo is a skin infection that causes itchy blisters and sores that produce brownish-yellow fluid. As the fluid dries, it forms a crust.  This condition is caused by staphylococci or streptococci bacteria. These bacteria cause impetigo when they get under the surface of the skin, such as through cuts, rashes, bug bites, or open sores.  Treatment for this condition may include antibiotic ointment or oral antibiotics.  To help prevent impetigo from spreading to other body areas, make sure you keep your fingernails short, avoid scratching, cover any blisters, and wash your hands often.  If you have impetigo, stay home until you have used an antibiotic cream for 48 hours (2 days) or an oral antibiotic medicine for 24 hours (1 day). You should only return to work and activities with other people if your skin shows significant improvement.  This  information is not intended to replace advice given to you by your health care provider. Make sure you discuss any questions you have with your health care provider.  Document Released: 06/01/2014 Document Revised: 06/02/2016 Document Reviewed: 06/02/2016  Elsevier Interactive Patient Education  2019 [employer]

## 2018-10-28 NOTE — Addendum Note (Signed)
Addended by: Charlott Rakes on: 10/28/2018 10:37 AM   Modules accepted: Level of Service

## 2018-11-07 ENCOUNTER — Ambulatory Visit: Payer: Medicare Other

## 2018-12-07 ENCOUNTER — Telehealth: Payer: Self-pay | Admitting: Family Medicine

## 2018-12-07 DIAGNOSIS — M0579 Rheumatoid arthritis with rheumatoid factor of multiple sites without organ or systems involvement: Secondary | ICD-10-CM

## 2018-12-07 NOTE — Telephone Encounter (Signed)
Patient's grand daughter called and wants home health referral for patient to help with bathing and frequent falls.

## 2018-12-07 NOTE — Telephone Encounter (Signed)
Patient granddaughter called in regards to a referral letter stating patient needs a letter for home health services. Please follow up.

## 2018-12-09 NOTE — Telephone Encounter (Signed)
Referral has been placed. 

## 2018-12-13 NOTE — Telephone Encounter (Signed)
Call received from [facility] stating that they will be able to accept the referral for SN and PT

## 2018-12-13 NOTE — Telephone Encounter (Signed)
Call placed to [facility] regarding PCS as a referral was made for the patient at the beginning of the year.  Spoke to [facility] who stated that they completed an assessment on 06/15/2018 but the patient/family never returned their calls to identify an agency that they would like to use for PCS.  The referral was then closed.    Call placed to patient's granddaughter, Kristy Oliver, to inquire if she has a preference for home health agencies She was agreeable to having the referral sent to [facility]. Informed her that if [facility] is not able to accept the referral, other agencies will be contacted but there is no guarantee that any agency will accept the referral.  She stated that she understood.   She was interested in personal care assistance  - bathing, meal prep, housekeeping, for her grandmother. Explained to her the information that was shared by [facility] healthcare.  Kristy Oliver is requesting another referral be placed.  Informed her that Dr Margarita Rana would be notified of the request.

## 2018-12-17 DIAGNOSIS — I10 Essential (primary) hypertension: Secondary | ICD-10-CM | POA: Diagnosis not present

## 2018-12-17 DIAGNOSIS — E785 Hyperlipidemia, unspecified: Secondary | ICD-10-CM | POA: Diagnosis not present

## 2018-12-17 DIAGNOSIS — M109 Gout, unspecified: Secondary | ICD-10-CM | POA: Diagnosis not present

## 2018-12-17 DIAGNOSIS — M069 Rheumatoid arthritis, unspecified: Secondary | ICD-10-CM | POA: Diagnosis not present

## 2018-12-17 DIAGNOSIS — Z4800 Encounter for change or removal of nonsurgical wound dressing: Secondary | ICD-10-CM | POA: Diagnosis not present

## 2018-12-17 DIAGNOSIS — M810 Age-related osteoporosis without current pathological fracture: Secondary | ICD-10-CM | POA: Diagnosis not present

## 2018-12-17 DIAGNOSIS — Z96652 Presence of left artificial knee joint: Secondary | ICD-10-CM | POA: Diagnosis not present

## 2018-12-17 DIAGNOSIS — L0100 Impetigo, unspecified: Secondary | ICD-10-CM | POA: Diagnosis not present

## 2018-12-17 DIAGNOSIS — Z96612 Presence of left artificial shoulder joint: Secondary | ICD-10-CM | POA: Diagnosis not present

## 2018-12-17 DIAGNOSIS — M0639 Rheumatoid nodule, multiple sites: Secondary | ICD-10-CM | POA: Diagnosis not present

## 2018-12-19 DIAGNOSIS — M069 Rheumatoid arthritis, unspecified: Secondary | ICD-10-CM | POA: Diagnosis not present

## 2018-12-19 DIAGNOSIS — I10 Essential (primary) hypertension: Secondary | ICD-10-CM | POA: Diagnosis not present

## 2018-12-19 DIAGNOSIS — M810 Age-related osteoporosis without current pathological fracture: Secondary | ICD-10-CM | POA: Diagnosis not present

## 2018-12-19 DIAGNOSIS — L0100 Impetigo, unspecified: Secondary | ICD-10-CM | POA: Diagnosis not present

## 2018-12-19 DIAGNOSIS — E785 Hyperlipidemia, unspecified: Secondary | ICD-10-CM | POA: Diagnosis not present

## 2018-12-19 DIAGNOSIS — M0639 Rheumatoid nodule, multiple sites: Secondary | ICD-10-CM | POA: Diagnosis not present

## 2018-12-26 ENCOUNTER — Telehealth: Payer: Self-pay

## 2018-12-26 NOTE — Telephone Encounter (Signed)
Call placed to [facility], spoke to [place] who confirmed receipt of the [facility] referral and stated that they will be contacting the patient for an assessment.

## 2019-01-16 DIAGNOSIS — L0100 Impetigo, unspecified: Secondary | ICD-10-CM | POA: Diagnosis not present

## 2019-01-16 DIAGNOSIS — Z96652 Presence of left artificial knee joint: Secondary | ICD-10-CM | POA: Diagnosis not present

## 2019-01-16 DIAGNOSIS — Z4800 Encounter for change or removal of nonsurgical wound dressing: Secondary | ICD-10-CM | POA: Diagnosis not present

## 2019-01-16 DIAGNOSIS — M109 Gout, unspecified: Secondary | ICD-10-CM | POA: Diagnosis not present

## 2019-01-16 DIAGNOSIS — Z96612 Presence of left artificial shoulder joint: Secondary | ICD-10-CM | POA: Diagnosis not present

## 2019-01-16 DIAGNOSIS — M069 Rheumatoid arthritis, unspecified: Secondary | ICD-10-CM | POA: Diagnosis not present

## 2019-01-16 DIAGNOSIS — M810 Age-related osteoporosis without current pathological fracture: Secondary | ICD-10-CM | POA: Diagnosis not present

## 2019-01-16 DIAGNOSIS — M0639 Rheumatoid nodule, multiple sites: Secondary | ICD-10-CM | POA: Diagnosis not present

## 2019-01-16 DIAGNOSIS — I10 Essential (primary) hypertension: Secondary | ICD-10-CM | POA: Diagnosis not present

## 2019-01-16 DIAGNOSIS — E785 Hyperlipidemia, unspecified: Secondary | ICD-10-CM | POA: Diagnosis not present

## 2019-01-26 DIAGNOSIS — I10 Essential (primary) hypertension: Secondary | ICD-10-CM | POA: Diagnosis not present

## 2019-01-26 DIAGNOSIS — M810 Age-related osteoporosis without current pathological fracture: Secondary | ICD-10-CM | POA: Diagnosis not present

## 2019-01-26 DIAGNOSIS — E785 Hyperlipidemia, unspecified: Secondary | ICD-10-CM | POA: Diagnosis not present

## 2019-01-26 DIAGNOSIS — M069 Rheumatoid arthritis, unspecified: Secondary | ICD-10-CM | POA: Diagnosis not present

## 2019-01-26 DIAGNOSIS — L0100 Impetigo, unspecified: Secondary | ICD-10-CM | POA: Diagnosis not present

## 2019-01-26 DIAGNOSIS — M0639 Rheumatoid nodule, multiple sites: Secondary | ICD-10-CM | POA: Diagnosis not present

## 2019-01-31 ENCOUNTER — Ambulatory Visit: Payer: Medicare Other | Attending: Family Medicine | Admitting: Family Medicine

## 2019-01-31 ENCOUNTER — Encounter: Payer: Self-pay | Admitting: Family Medicine

## 2019-01-31 ENCOUNTER — Other Ambulatory Visit: Payer: Self-pay

## 2019-01-31 VITALS — BP 145/97 | HR 66 | Temp 98.2°F | Ht 62.0 in | Wt 118.0 lb

## 2019-01-31 DIAGNOSIS — Z96652 Presence of left artificial knee joint: Secondary | ICD-10-CM | POA: Diagnosis not present

## 2019-01-31 DIAGNOSIS — F419 Anxiety disorder, unspecified: Secondary | ICD-10-CM | POA: Insufficient documentation

## 2019-01-31 DIAGNOSIS — Z881 Allergy status to other antibiotic agents status: Secondary | ICD-10-CM | POA: Insufficient documentation

## 2019-01-31 DIAGNOSIS — Z886 Allergy status to analgesic agent status: Secondary | ICD-10-CM | POA: Diagnosis not present

## 2019-01-31 DIAGNOSIS — I10 Essential (primary) hypertension: Secondary | ICD-10-CM | POA: Diagnosis not present

## 2019-01-31 DIAGNOSIS — M546 Pain in thoracic spine: Secondary | ICD-10-CM

## 2019-01-31 DIAGNOSIS — F17210 Nicotine dependence, cigarettes, uncomplicated: Secondary | ICD-10-CM

## 2019-01-31 DIAGNOSIS — Z96612 Presence of left artificial shoulder joint: Secondary | ICD-10-CM | POA: Diagnosis not present

## 2019-01-31 DIAGNOSIS — E785 Hyperlipidemia, unspecified: Secondary | ICD-10-CM | POA: Diagnosis not present

## 2019-01-31 DIAGNOSIS — M0639 Rheumatoid nodule, multiple sites: Secondary | ICD-10-CM | POA: Diagnosis not present

## 2019-01-31 DIAGNOSIS — Z79899 Other long term (current) drug therapy: Secondary | ICD-10-CM | POA: Insufficient documentation

## 2019-01-31 DIAGNOSIS — R21 Rash and other nonspecific skin eruption: Secondary | ICD-10-CM

## 2019-01-31 DIAGNOSIS — L84 Corns and callosities: Secondary | ICD-10-CM

## 2019-01-31 DIAGNOSIS — G8929 Other chronic pain: Secondary | ICD-10-CM

## 2019-01-31 DIAGNOSIS — Z0000 Encounter for general adult medical examination without abnormal findings: Secondary | ICD-10-CM

## 2019-01-31 DIAGNOSIS — M2041 Other hammer toe(s) (acquired), right foot: Secondary | ICD-10-CM | POA: Diagnosis not present

## 2019-01-31 DIAGNOSIS — Z720 Tobacco use: Secondary | ICD-10-CM

## 2019-01-31 DIAGNOSIS — M0579 Rheumatoid arthritis with rheumatoid factor of multiple sites without organ or systems involvement: Secondary | ICD-10-CM | POA: Diagnosis not present

## 2019-01-31 DIAGNOSIS — B86 Scabies: Secondary | ICD-10-CM | POA: Diagnosis not present

## 2019-01-31 DIAGNOSIS — M06322 Rheumatoid nodule, left elbow: Secondary | ICD-10-CM | POA: Diagnosis not present

## 2019-01-31 DIAGNOSIS — Z716 Tobacco abuse counseling: Secondary | ICD-10-CM | POA: Diagnosis not present

## 2019-01-31 DIAGNOSIS — Z888 Allergy status to other drugs, medicaments and biological substances status: Secondary | ICD-10-CM | POA: Insufficient documentation

## 2019-01-31 DIAGNOSIS — M06321 Rheumatoid nodule, right elbow: Secondary | ICD-10-CM | POA: Insufficient documentation

## 2019-01-31 DIAGNOSIS — M06349 Rheumatoid nodule, unspecified hand: Secondary | ICD-10-CM | POA: Insufficient documentation

## 2019-01-31 DIAGNOSIS — Z23 Encounter for immunization: Secondary | ICD-10-CM | POA: Diagnosis not present

## 2019-01-31 MED ORDER — TIZANIDINE HCL 4 MG PO TABS: ORAL_TABLET | ORAL | 2 refills | Status: DC

## 2019-01-31 MED ORDER — PERMETHRIN 5 % EX CREA: 1.0000 "application " | TOPICAL_CREAM | Freq: Once | CUTANEOUS | 0 refills | Status: AC

## 2019-01-31 MED ORDER — TRIAMCINOLONE ACETONIDE 0.1 % EX CREA: 1.0000 "application " | TOPICAL_CREAM | Freq: Two times a day (BID) | CUTANEOUS | 1 refills | Status: DC

## 2019-01-31 NOTE — Patient Instructions (Addendum)
Please call you Rheumatologist below to make a follow up appointment for your Rheumatoid Arthritis.   [facility] Rheumat[facility] - [facility]  [address]  [address] [place], [place] [address]  [phone]  Marlou Sa, PA-C  [id number]  [place], [place] [address]  [phone] (Fax)       Scabies, Adult  Scabies is a skin condition that happens when very small insects get under the skin (infestation). This causes a rash and severe itchiness. Scabies can spread from person to person (is contagious). If you get scabies, it is common for others in your household to get scabies too. With proper treatment, symptoms usually go away in 2-4 weeks. Scabies usually does not cause lasting problems. What are the causes? This condition is caused by tiny mites (Sarcoptes scabiei, or human itch mites) that can only be seen with a microscope. The mites get into the top layer of skin and lay eggs. Scabies can spread from person to person through:  Close contact with a person who has scabies.  Sharing or having contact with infested items, such as towels, bedding, or clothing. What increases the risk? The following factors may make you more likely to develop this condition:  Living in a nursing home or other extended care facility.  Having sexual contact with a partner who has scabies.  Caring for others who are at increased risk for scabies. What are the signs or symptoms? Symptoms of this condition include:  Severe itchiness. This is often worse at night.  A rash that includes tiny red bumps or blisters. The rash commonly occurs on the hands, wrists, elbows, armpits, chest, waist, groin, or buttocks. The bumps may form a line (burrow) in some areas.  Skin irritation. This can include scaly patches or sores. How is this diagnosed? This condition may be diagnosed based on:  A physical exam of the skin.  A skin  test. Your health care provider may take a sample of your affected skin (skin scraping) and have it examined under a microscope for signs of mites. How is this treated? This condition may be treated with:  Medicated cream or lotion that kills the mites. This is spread on the entire body and left on for several hours. Usually, one treatment with medicated cream or lotion is enough to kill all the mites. In severe cases, the treatment may need to be repeated.  Medicated cream that relieves itching.  Medicines taken by mouth (orally) that: ? Relieve itching. ? Reduce the swelling and redness. ? Kill the mites. This treatment may be done in severe cases. Follow these instructions at home: Medicines   Take or apply over-the-counter and prescription medicines as told by your health care provider.  Apply medicated cream or lotion as told by your health care provider.  Do not wash off the medicated cream or lotion until the necessary amount of time has passed. Skin care   Avoid scratching the affected areas of your skin.  Keep your fingernails closely trimmed to reduce injury from scratching.  Take cool baths or apply cool washcloths to your skin to help reduce itching. General instructions  Clean all items that you recently had contact with, including bedding, clothing, and furniture. Do this on the same day that you start treatment. ? Dry clean items, or use hot water to wash items. Dry items on the hot dry cycle. ? Place items that cannot be washed into closed, airtight  plastic bags for at least 3 days. The mites cannot live for more than 3 days away from human skin. ? Vacuum furniture and mattresses that you use.  Make sure that other people who may have been infested are examined by a health care provider. These include members of your household and anyone who may have had contact with infested items.  Keep all follow-up visits as told by your health care provider. This is  important. Contact a health care provider if:  You have itching that does not go away after 4 weeks of treatment.  You continue to develop new bumps or burrows.  You have redness, swelling, or pain in your rash area after treatment.  You have fluid, blood, or pus coming from your rash. Summary  Scabies is a skin condition that causes a rash and severe itchiness.  This condition is caused by tiny mites that get into the top layer of the skin and lay eggs.  Scabies can spread from person to person.  Follow treatments as recommended by your health care provider.  Clean all items that you recently had contact with. This information is not intended to replace advice given to you by your health care provider. Make sure you discuss any questions you have with your health care provider. Document Released: 01/30/2015 Document Revised: 03/16/2018 Document Reviewed: 03/16/2018 Elsevier Patient Education  2020 [employer].

## 2019-01-31 NOTE — Progress Notes (Signed)
Per Grandson: Spot on bottom of left toe Toes are bent on the right foot Rash on right foot and shoulders Knots in head.  Patient states that she has had surgery on hands and now hands hangs down.

## 2019-01-31 NOTE — Progress Notes (Signed)
Subjective:  Patient ID: Kristy Oliver, female    DOB: 11-09-1945  Age: 73 y.o. MRN: [id number]  CC: Hypertension   HPI Kristy Oliver is a 73 year old female with a past medical history of hypertensiton and rheumatoid arthritis, osteoporosis, left hip, left humeral fracture status post nailing of left hip in 03/2018 who presents today for follow-up visit. She last saw her rheumatologist in 08/2018 and does not have an upcoming appointment.  She has a list of complaints today which include knots in her scalp which we had previously discussed with secondary to her rheumatoid nodules.  She is also concerned about her right second toe which is deviated and is now beneath her right third toe and causes discomfort while walking.  She recalls a remote history of trauma to the right second toe.  She also has a callus on the base of her left big toe which is uncomfortable and painful while walking. On her left anterior shoulder region there is a rash which is pruritic and worse at night and she endorses itching which occurs in her back as well.  She is unsure of the duration. The chronic rash on her right anterior foot has not resolved despite the use of triamcinolone cream.  She is compliant with all her other chronic medications. She smokes 1 or 2 cigarettes/day which she states helps with her anxiety and when she is stressed; has been smoking since he was a teenager and is not ready to quit.  Past Medical History:  Diagnosis Date  . Allergy   . Arthritis   . Asthma    as young person, none currently  . Facial cellulitis 11/17/2012  . Gout   . Helicobacter pylori gastritis 12/13/2016  . HTN (hypertension), malignant 11/17/2012  . Hx of adenomatous colonic polyps 12/29/2016  . Hyperlipidemia    medicine currently  . Hypertension   . Osteoporosis   . Renal disorder   . Tuberculosis    pt took meds for 9 months - finish meds last week     Past Surgical History:  Procedure Laterality Date   . BREAST LUMPECTOMY    . c-section x 2    . COLONOSCOPY    . JOINT REPLACEMENT     left knee & left shoulder  . left breast cyst removal    . UPPER GASTROINTESTINAL ENDOSCOPY    . WRIST SURGERY  2018   x2    Family History  Problem Relation Age of Onset  . Colon cancer Neg Hx   . Esophageal cancer Neg Hx   . Rectal cancer Neg Hx   . Stomach cancer Neg Hx     Allergies  Allergen Reactions  . Clindamycin Hives, Rash and Swelling  . Iodinated Diagnostic Agents     unknown  . Metrizamide     unknown  . Motrin [Ibuprofen] Other (See Comments)    blackout States she is only allergic to 800 mg formulation  . Milk-Related Compounds Diarrhea  . Shrimp [Shellfish Allergy] Hives, Swelling and Rash    Outpatient Medications Prior to Visit  Medication Sig Dispense Refill  . alendronate (FOSAMAX) 70 MG tablet Take 1 tablet (70 mg total) by mouth every 7 (seven) days. Take with a full glass of water on an empty stomach. 4 tablet 6  . amLODipine (NORVASC) 10 MG tablet Take 1 tablet (10 mg total) by mouth daily. 90 tablet 1  . carvedilol (COREG) 6.25 MG tablet TAKE 1 TABLET BY MOUTH 2 TIMES  A DAY WITH A MEAL 180 tablet 1  . cetirizine (ZYRTEC) 10 MG tablet TAKE 1 TABLET BY MOUTH ONCE DAILY 30 tablet 2  . fluticasone (FLONASE) 50 MCG/ACT nasal spray INSTILL 2 SPRAYS INTO EACH NOSTRIL DAILY 16 g 2  . folic acid (FOLVITE) A999333 MCG tablet Take 1 tablet (400 mcg total) by mouth daily. 90 tablet 1  . leflunomide (ARAVA) 10 MG tablet Take 10 mg by mouth daily.    . mirtazapine (REMERON) 15 MG tablet Take 1 tablet (15 mg total) by mouth at bedtime. 30 tablet 3  . pravastatin (PRAVACHOL) 20 MG tablet Take 1 tablet (20 mg total) by mouth daily. 30 tablet 5  . tiZANidine (ZANAFLEX) 4 MG tablet TAKE 1 TABLET BY MOUTH EVERY 12 HOURS AS NEEDED FOR MUSCLE SPASMS 60 tablet 2  . triamcinolone cream (KENALOG) 0.1 % Apply 1 application topically 2 (two) times daily. 45 g 1  . cephALEXin (KEFLEX) 500 MG  capsule Take 1 capsule (500 mg total) by mouth 2 (two) times daily. (Patient not taking: Reported on 01/31/2019) 20 capsule 0  . predniSONE (DELTASONE) 20 MG tablet Take 1 tablet (20 mg total) by mouth 2 (two) times daily with a meal. (Patient not taking: Reported on 01/31/2019) 10 tablet 0   Facility-Administered Medications Prior to Visit  Medication Dose Route Frequency Provider Last Rate Last Dose  . 0.9 %  sodium chloride infusion  500 mL Intravenous Continuous Gatha Mayer, MD         ROS Review of Systems  Constitutional: Negative for activity change, appetite change and fatigue.  HENT: Negative for congestion, sinus pressure and sore throat.   Eyes: Negative for visual disturbance.  Respiratory: Negative for cough, chest tightness, shortness of breath and wheezing.   Cardiovascular: Negative for chest pain and palpitations.  Gastrointestinal: Negative for abdominal distention, abdominal pain and constipation.  Endocrine: Negative for polydipsia.  Genitourinary: Negative for dysuria and frequency.  Musculoskeletal: Positive for back pain. Negative for arthralgias.  Skin: Positive for rash.  Neurological: Negative for tremors, light-headedness and numbness.  Hematological: Does not bruise/bleed easily.  Psychiatric/Behavioral: Negative for agitation and behavioral problems.    Objective:  BP (!) 145/97   Pulse 66   Temp 98.2 F (36.8 C) (Oral)   Ht 5\' 2"  (1.575 m)   Wt 118 lb (53.5 kg)   SpO2 94%   BMI 21.58 kg/m   BP/Weight 01/31/2019 10/27/2018 99991111  Systolic BP [id number]  Diastolic BP 97 74 76  Wt. (Lbs) 118 117 116  BMI 21.58 21.4 21.22      Physical Exam Constitutional:      Appearance: She is well-developed.  Cardiovascular:     Rate and Rhythm: Normal rate.     Heart sounds: Normal heart sounds. No murmur.  Pulmonary:     Effort: Pulmonary effort is normal.     Breath sounds: Normal breath sounds. No wheezing or rales.  Chest:     Chest wall: No  tenderness.  Abdominal:     General: Bowel sounds are normal. There is no distension.     Palpations: Abdomen is soft. There is no mass.     Tenderness: There is no abdominal tenderness.  Musculoskeletal: Normal range of motion.     Comments: Rheumatoid nodules in elbows, fingers, scalp Right second toe laterally deviated inferior to right third toe Tender callus on plantar aspect of left big toe  Skin:    Comments: Hyperpigmented, lichenified rash on right  dorsal aspect of right foot and ankle Pinpoint hyperpigmented scars in anterior left shoulder, back and extending to superior aspect of gluteal region  Neurological:     Mental Status: She is alert and oriented to person, place, and time.     CMP Latest Ref Rng & Units 07/20/2018 08/20/2017 08/03/2017  Glucose 65 - 99 mg/dL 92 74 86  BUN 8 - 27 mg/dL 12 10 10   Creatinine 0.57 - 1.00 mg/dL 0.68 0.68 0.68  Sodium 134 - 144 mmol/L 139 137 139  Potassium 3.5 - 5.2 mmol/L 4.6 4.5 4.2  Chloride 96 - 106 mmol/L 103 106 110  CO2 20 - 29 mmol/L 19(L) 26 22  Calcium 8.7 - 10.3 mg/dL 10.2 9.5 9.3  Total Protein 6.0 - 8.5 g/dL 9.3(H) 8.9(H) 9.1(H)  Total Bilirubin 0.0 - 1.2 mg/dL 0.5 0.6 0.5  Alkaline Phos 39 - 117 IU/L 400(H) - -  AST 0 - 40 IU/L 45(H) 39(H) 45(H)  ALT 0 - 32 IU/L 32 28 32(H)    Lipid Panel     Component Value Date/Time   CHOL 250 (H) 07/02/2015 0915   TRIG 102 07/02/2015 0915   HDL 96 07/02/2015 0915   CHOLHDL 2.6 07/02/2015 0915   VLDL 20 07/02/2015 0915   LDLCALC 134 (H) 07/02/2015 0915    CBC    Component Value Date/Time   WBC 10.9 (H) 07/20/2018 1102   WBC 7.7 08/20/2017 1526   RBC 5.48 (H) 07/20/2018 1102   RBC 4.51 08/20/2017 1526   HGB 16.1 (H) 07/20/2018 1102   HCT 50.2 (H) 07/20/2018 1102   PLT 231 07/20/2018 1102   MCV 92 07/20/2018 1102   MCH 29.4 07/20/2018 1102   MCH 30.6 08/20/2017 1526   MCHC 32.1 07/20/2018 1102   MCHC 34.3 08/20/2017 1526   RDW 13.0 07/20/2018 1102   LYMPHSABS 1.3  07/20/2018 1102   MONOABS 1,197 (H) 05/11/2016 0816   EOSABS 0.2 07/20/2018 1102   BASOSABS 0.1 07/20/2018 1102    Lab Results  Component Value Date   HGBA1C 5.5 11/02/2016    Assessment & Plan:   1. Rheumatoid arthritis involving multiple sites with positive rheumatoid factor (HCC) End-stage rheumatoid arthritis Currently on leflunomide She has not seen rheumatology since 08/2018 and information for specialist provided so she can schedule an appointment - Basic Metabolic Panel  2. Rash and nonspecific skin eruption Rash on responsive to triamcinolone - Ambulatory referral to Dermatology - triamcinolone cream (KENALOG) 0.1 %; Apply 1 application topically 2 (two) times daily.  Dispense: 80 g; Refill: 1  3. Chronic midline thoracic back pain Kyphosis of thoracic spine - tiZANidine (ZANAFLEX) 4 MG tablet; TAKE 1 TABLET BY MOUTH EVERY 12 HOURS AS NEEDED FOR MUSCLE SPASMS  Dispense: 60 tablet; Refill: 2  4. Plantar callus - Ambulatory referral to Podiatry  5. Scabies Discussed care of bedding - permethrin (ELIMITE) 5 % cream; Apply 1 application topically once for 1 dose.  Dispense: 60 g; Refill: 0  6. Hammer toe of right foot - Ambulatory referral to Podiatry  7. Tobacco abuse Spent 5 minutes counseling on cessation and she is not ready to quit using tobacco at this time as she uses it when she is stressed.  She has smoked since she was a teenager.   8. Healthcare maintenance - Flu Vaccine QUAD 36+ mos IM    Meds ordered this encounter  Medications  . triamcinolone cream (KENALOG) 0.1 %    Sig: Apply 1 application topically 2 (  two) times daily.    Dispense:  80 g    Refill:  1  . permethrin (ELIMITE) 5 % cream    Sig: Apply 1 application topically once for 1 dose.    Dispense:  60 g    Refill:  0  . tiZANidine (ZANAFLEX) 4 MG tablet    Sig: TAKE 1 TABLET BY MOUTH EVERY 12 HOURS AS NEEDED FOR MUSCLE SPASMS    Dispense:  60 tablet    Refill:  2    Follow-up:  Return in about 3 months (around 05/02/2019) for medical conditions.       Charlott Rakes, MD, FAAFP. [facility] and [place], [place]   01/31/2019, 11:55 AM

## 2019-02-01 LAB — BASIC METABOLIC PANEL
BUN/Creatinine Ratio: 8 — ABNORMAL LOW (ref 12–28)
BUN: 6 mg/dL — ABNORMAL LOW (ref 8–27)
CO2: 22 mmol/L (ref 20–29)
Calcium: 9.8 mg/dL (ref 8.7–10.3)
Chloride: 104 mmol/L (ref 96–106)
Creatinine, Ser: 0.74 mg/dL (ref 0.57–1.00)
GFR calc Af Amer: 95 mL/min/{1.73_m2} (ref >59)
GFR calc non Af Amer: 82 mL/min/{1.73_m2} (ref >59)
Glucose: 129 mg/dL — ABNORMAL HIGH (ref 65–99)
Potassium: 3.9 mmol/L (ref 3.5–5.2)
Sodium: 141 mmol/L (ref 134–144)

## 2019-02-10 ENCOUNTER — Telehealth: Payer: Self-pay | Admitting: Family Medicine

## 2019-02-10 DIAGNOSIS — L0100 Impetigo, unspecified: Secondary | ICD-10-CM | POA: Diagnosis not present

## 2019-02-10 DIAGNOSIS — I10 Essential (primary) hypertension: Secondary | ICD-10-CM | POA: Diagnosis not present

## 2019-02-10 DIAGNOSIS — M069 Rheumatoid arthritis, unspecified: Secondary | ICD-10-CM | POA: Diagnosis not present

## 2019-02-10 DIAGNOSIS — M810 Age-related osteoporosis without current pathological fracture: Secondary | ICD-10-CM | POA: Diagnosis not present

## 2019-02-10 DIAGNOSIS — E785 Hyperlipidemia, unspecified: Secondary | ICD-10-CM | POA: Diagnosis not present

## 2019-02-10 DIAGNOSIS — M0639 Rheumatoid nodule, multiple sites: Secondary | ICD-10-CM | POA: Diagnosis not present

## 2019-02-10 NOTE — Telephone Encounter (Signed)
[facility] with [facility] called to verify orders for the patients shoe adjustment.states she needs a referral for a orthotist for shoe modification due to left leg being shorter please follow up.

## 2019-02-13 NOTE — Telephone Encounter (Signed)
Referral to podiatry had been placed at her last visit and they will address.

## 2019-02-15 NOTE — Telephone Encounter (Signed)
Left message on Kristy Oliver, PT voicemail of placement of referral.Attempt to contact patient to inform that the the referral was placed as well. No answer and unable to leave voicemail at either contact number listed.

## 2019-02-17 ENCOUNTER — Ambulatory Visit (INDEPENDENT_AMBULATORY_CARE_PROVIDER_SITE_OTHER): Payer: Medicare Other | Admitting: Podiatry

## 2019-02-17 ENCOUNTER — Other Ambulatory Visit: Payer: Self-pay

## 2019-02-17 ENCOUNTER — Ambulatory Visit (INDEPENDENT_AMBULATORY_CARE_PROVIDER_SITE_OTHER): Payer: Medicare Other

## 2019-02-17 ENCOUNTER — Encounter: Payer: Self-pay | Admitting: Podiatry

## 2019-02-17 DIAGNOSIS — Z7409 Other reduced mobility: Secondary | ICD-10-CM | POA: Insufficient documentation

## 2019-02-17 DIAGNOSIS — L97521 Non-pressure chronic ulcer of other part of left foot limited to breakdown of skin: Secondary | ICD-10-CM

## 2019-02-17 DIAGNOSIS — M79675 Pain in left toe(s): Secondary | ICD-10-CM | POA: Diagnosis not present

## 2019-02-17 DIAGNOSIS — M79674 Pain in right toe(s): Secondary | ICD-10-CM

## 2019-02-17 DIAGNOSIS — Q828 Other specified congenital malformations of skin: Secondary | ICD-10-CM

## 2019-02-17 DIAGNOSIS — B351 Tinea unguium: Secondary | ICD-10-CM

## 2019-02-17 DIAGNOSIS — Z789 Other specified health status: Secondary | ICD-10-CM | POA: Insufficient documentation

## 2019-02-17 MED ORDER — MUPIROCIN 2 % EX OINT: 1.0000 "application " | TOPICAL_OINTMENT | Freq: Two times a day (BID) | CUTANEOUS | 2 refills | Status: AC

## 2019-02-17 MED ORDER — DOXYCYCLINE HYCLATE 100 MG PO TABS: 100.0000 mg | ORAL_TABLET | Freq: Two times a day (BID) | ORAL | 0 refills | Status: DC

## 2019-02-22 NOTE — Progress Notes (Signed)
Subjective: 73 y.o. returns the office today for painful, elongated, thickened toenails which she cannot trim herself. Denies any redness or drainage around the nails.  She also has a callus on the left big toe which had to have trimmed.  Denies any acute changes since last appointment and no new complaints today. Denies any systemic complaints such as fevers, chills, nausea, vomiting.   PCP: Charlott Rakes, MD   Objective: AAO 3, NAD DP/PT pulses palpable, CRT less than 3 seconds Nails hypertrophic, dystrophic, elongated, brittle, discolored 10. There is tenderness overlying the nails 1-5 bilaterally. There is no surrounding erythema or drainage along the nail sites. Hyperkeratotic lesion left hallux.  Upon debridement there was a wound measuring 0.2 x 0.2 x 0.4 cm there is no probing to bone, undermining or tunneling. Significant deformity present to the right second toe. No open lesions or pre-ulcerative lesions are identified. No other areas of tenderness bilateral lower extremities. No overlying edema, erythema, increased warmth. No pain with calf compression, swelling, warmth, erythema.  Assessment: Patient presents with symptomatic onychomycosis; ulceration left hallux  Plan: -Treatment options including alternatives, risks, complications were discussed -Nails sharply debrided 10 without complication/bleeding. -Hyperkeratotic lesion sharply debrided x1 to reveal the underlying ulceration.  Recommended mupirocin ointment dressing changes daily.  X-rays were obtained reviewed.  No definite evidence of acute osteomyelitis or soft tissue emphysema.  Offloading. -Continue offloading of the right second toe as well as left hallux. -Discussed daily foot inspection. If there are any changes, to call the office immediately.  -Follow-up in 1 week for wound check or sooner if any problems are to arise. In the meantime, encouraged to call the office with any questions, concerns, changes  symptoms.  Celesta Gentile, DPM

## 2019-02-23 ENCOUNTER — Other Ambulatory Visit: Payer: Self-pay | Admitting: Family Medicine

## 2019-02-23 DIAGNOSIS — R0982 Postnasal drip: Secondary | ICD-10-CM

## 2019-02-23 DIAGNOSIS — R21 Rash and other nonspecific skin eruption: Secondary | ICD-10-CM

## 2019-05-02 ENCOUNTER — Ambulatory Visit: Payer: Medicare Other | Admitting: Family Medicine

## 2019-05-12 ENCOUNTER — Telehealth: Payer: Self-pay | Admitting: *Deleted

## 2019-05-12 NOTE — Telephone Encounter (Signed)
Staff called number on file to schedule appt for patient due to patient request. Message came up stating the number you have dialed is not in service and to check the number and try call again. Staff called number on file again and same message.

## 2019-05-15 ENCOUNTER — Other Ambulatory Visit: Payer: Self-pay | Admitting: Family Medicine

## 2019-05-15 DIAGNOSIS — J328 Other chronic sinusitis: Secondary | ICD-10-CM

## 2019-05-15 DIAGNOSIS — R0982 Postnasal drip: Secondary | ICD-10-CM

## 2019-05-15 DIAGNOSIS — R21 Rash and other nonspecific skin eruption: Secondary | ICD-10-CM

## 2019-06-29 ENCOUNTER — Encounter: Payer: Self-pay | Admitting: Family Medicine

## 2019-06-29 ENCOUNTER — Other Ambulatory Visit: Payer: Self-pay

## 2019-06-29 ENCOUNTER — Ambulatory Visit: Payer: Medicare Other | Attending: Family Medicine | Admitting: Family Medicine

## 2019-06-29 DIAGNOSIS — E7800 Pure hypercholesterolemia, unspecified: Secondary | ICD-10-CM | POA: Diagnosis not present

## 2019-06-29 DIAGNOSIS — M0579 Rheumatoid arthritis with rheumatoid factor of multiple sites without organ or systems involvement: Secondary | ICD-10-CM | POA: Diagnosis not present

## 2019-06-29 DIAGNOSIS — M546 Pain in thoracic spine: Secondary | ICD-10-CM | POA: Diagnosis not present

## 2019-06-29 DIAGNOSIS — M818 Other osteoporosis without current pathological fracture: Secondary | ICD-10-CM

## 2019-06-29 DIAGNOSIS — R21 Rash and other nonspecific skin eruption: Secondary | ICD-10-CM

## 2019-06-29 DIAGNOSIS — I10 Essential (primary) hypertension: Secondary | ICD-10-CM | POA: Diagnosis not present

## 2019-06-29 DIAGNOSIS — R630 Anorexia: Secondary | ICD-10-CM

## 2019-06-29 DIAGNOSIS — G8929 Other chronic pain: Secondary | ICD-10-CM

## 2019-06-29 MED ORDER — AMLODIPINE BESYLATE 10 MG PO TABS: 10.0000 mg | ORAL_TABLET | Freq: Every day | ORAL | 1 refills | Status: DC

## 2019-06-29 MED ORDER — MIRTAZAPINE 15 MG PO TABS: 15.0000 mg | ORAL_TABLET | Freq: Every day | ORAL | 1 refills | Status: DC

## 2019-06-29 MED ORDER — ALENDRONATE SODIUM 70 MG PO TABS: 70.0000 mg | ORAL_TABLET | ORAL | 6 refills | Status: DC

## 2019-06-29 MED ORDER — FOLIC ACID 400 MCG PO TABS: 400.0000 ug | ORAL_TABLET | Freq: Every day | ORAL | 1 refills | Status: DC

## 2019-06-29 MED ORDER — TRIAMCINOLONE ACETONIDE 0.1 % EX CREA: TOPICAL_CREAM | CUTANEOUS | 0 refills | Status: DC

## 2019-06-29 MED ORDER — TIZANIDINE HCL 4 MG PO TABS: ORAL_TABLET | ORAL | 2 refills | Status: DC

## 2019-06-29 MED ORDER — TRAMADOL HCL 50 MG PO TABS: 50.0000 mg | ORAL_TABLET | Freq: Three times a day (TID) | ORAL | 1 refills | Status: AC | PRN

## 2019-06-29 MED ORDER — CARVEDILOL 6.25 MG PO TABS: ORAL_TABLET | ORAL | 1 refills | Status: DC

## 2019-06-29 MED ORDER — PRAVASTATIN SODIUM 20 MG PO TABS: 20.0000 mg | ORAL_TABLET | Freq: Every day | ORAL | 1 refills | Status: DC

## 2019-06-29 NOTE — Progress Notes (Signed)
Virtual Visit via Telephone Note  I connected with Kristy Oliver, on 06/29/2019 at 11:16 AM by telephone due to the COVID-19 pandemic and verified that I am speaking with the correct person using two identifiers.   Consent: I discussed the limitations, risks, security and privacy concerns of performing an evaluation and management service by telephone and the availability of in person appointments. I also discussed with the patient that there may be a patient responsible charge related to this service. The patient expressed understanding and agreed to proceed.   Location of Patient: Home  Location of Provider: Clinic   Persons participating in Telemedicine visit: Lyrics Guerrier Farrington-CMA Clement Husbands daughter Dr. Margarita Rana     History of Present Illness: Kristy Oliver a 74 year old female with a past medical history of hypertensiton and rheumatoid arthritis, osteoporosis, left hip, left humeral fracture status post nailing of left hip in 03/2018 who presents today for follow-up visit. She last saw her rheumatologist in 08/2018 and does not have an upcoming appointment    She has pain in her spine which has worsened with the cold. Pain is 8-9/10; her back brace has made her pain worse and she has no pain medications. She has not been to see her Rheumatologist this year and has not been on [place]. Does not check BP at home but has been compliant with her antihypertensive.  Her foot has  itchy and on her R ankle she had a swelling which she described as a  blister that popped with clear discharge.  Denies presence of edema, purulent discharge or fever. She saw Podiatry in 01/2019 and her nails were debrided. Past Medical History:  Diagnosis Date  . Allergy   . Arthritis   . Asthma    as young person, none currently  . Facial cellulitis 11/17/2012  . Gout   . Helicobacter pylori gastritis 12/13/2016  . HTN (hypertension), malignant 11/17/2012  . Hx of adenomatous  colonic polyps 12/29/2016  . Hyperlipidemia    medicine currently  . Hypertension   . Osteoporosis   . Renal disorder   . Tuberculosis    pt took meds for 9 months - finish meds last week    Allergies  Allergen Reactions  . Clindamycin Hives, Rash and Swelling  . Metrizamide     unknown  . Motrin [Ibuprofen] Other (See Comments)    blackout States she is only allergic to 800 mg formulation  . Iodinated Diagnostic Agents Rash    unknown unknown  . Milk-Related Compounds Diarrhea  . Shrimp [Shellfish Allergy] Hives, Swelling and Rash    Current Outpatient Medications on File Prior to Visit  Medication Sig Dispense Refill  . alendronate (FOSAMAX) 70 MG tablet Take 1 tablet (70 mg total) by mouth every 7 (seven) days. Take with a full glass of water on an empty stomach. 4 tablet 6  . amLODipine (NORVASC) 10 MG tablet Take 1 tablet (10 mg total) by mouth daily. 90 tablet 1  . carvedilol (COREG) 6.25 MG tablet TAKE 1 TABLET BY MOUTH 2 TIMES A DAY WITH A MEAL 180 tablet 1  . cetirizine (ZYRTEC) 10 MG tablet TAKE 1 TABLET BY MOUTH ONCE DAILY 30 tablet 0  . fluticasone (FLONASE) 50 MCG/ACT nasal spray INSTILL 2 SPRAYS INTO EACH NOSTRIL DAILY 16 g 0  . folic acid (FOLVITE) A999333 MCG tablet TAKE 1 TABLET BY MOUTH ONCE DAILY 30 tablet 0  . leflunomide (ARAVA) 10 MG tablet Take 10 mg by mouth daily.    Marland Kitchen  mirtazapine (REMERON) 15 MG tablet TAKE 1 TABLET BY MOUTH EVERY NIGHT AT BEDTIME 30 tablet 0  . mupirocin ointment (BACTROBAN) 2 % Apply 1 application topically 2 (two) times daily. 30 g 2  . pravastatin (PRAVACHOL) 20 MG tablet Take 1 tablet (20 mg total) by mouth daily. 30 tablet 5  . tiZANidine (ZANAFLEX) 4 MG tablet TAKE 1 TABLET BY MOUTH EVERY 12 HOURS AS NEEDED FOR MUSCLE SPASMS 60 tablet 2  . triamcinolone cream (KENALOG) 0.1 % APPLY 1 APPLICATION TOPICALLY 2 TIMES DAILY 80 g 0  . doxycycline (VIBRA-TABS) 100 MG tablet Take 1 tablet (100 mg total) by mouth 2 (two) times daily. (Patient  not taking: Reported on 06/29/2019) 20 tablet 0   Current Facility-Administered Medications on File Prior to Visit  Medication Dose Route Frequency Provider Last Rate Last Admin  . 0.9 %  sodium chloride infusion  500 mL Intravenous Continuous Gatha Mayer, MD        Observations/Objective: Alert, awake, oriented x3 Not in acute distress   CMP Latest Ref Rng & Units 01/31/2019 07/20/2018 08/20/2017  Glucose 65 - 99 mg/dL 129(H) 92 74  BUN 8 - 27 mg/dL 6(L) 12 10  Creatinine 0.57 - 1.00 mg/dL 0.74 0.68 0.68  Sodium 134 - 144 mmol/L 141 139 137  Potassium 3.5 - 5.2 mmol/L 3.9 4.6 4.5  Chloride 96 - 106 mmol/L 104 103 106  CO2 20 - 29 mmol/L 22 19(L) 26  Calcium 8.7 - 10.3 mg/dL 9.8 10.2 9.5  Total Protein 6.0 - 8.5 g/dL - 9.3(H) 8.9(H)  Total Bilirubin 0.0 - 1.2 mg/dL - 0.5 0.6  Alkaline Phos 39 - 117 IU/L - 400(H) -  AST 0 - 40 IU/L - 45(H) 39(H)  ALT 0 - 32 IU/L - 32 28    Lipid Panel     Component Value Date/Time   CHOL 250 (H) 07/02/2015 0915   TRIG 102 07/02/2015 0915   HDL 96 07/02/2015 0915   CHOLHDL 2.6 07/02/2015 0915   VLDL 20 07/02/2015 0915   LDLCALC 134 (H) 07/02/2015 0915     Assessment and Plan: 1. Essential hypertension Uncontrolled blood pressure from last office visit in 01/2019 was 145/97 Advised to check blood pressures at home as she does have a blood pressure monitor which she has not been using Counseled on blood pressure goal of less than 130/80, low-sodium, DASH diet, medication compliance, 150 minutes of moderate intensity exercise per week. Discussed medication compliance, adverse effects. - amLODipine (NORVASC) 10 MG tablet; Take 1 tablet (10 mg total) by mouth daily.  Dispense: 90 tablet; Refill: 1 - carvedilol (COREG) 6.25 MG tablet; TAKE 1 TABLET BY MOUTH 2 TIMES A DAY WITH A MEAL  Dispense: 180 tablet; Refill: 1 - Basic Metabolic Panel; Future - Lipid panel; Future  2. Pure hypercholesterolemia She is due for lipid panel and I will have  her come into the lab for fasting labs Continue pravastatin - pravastatin (PRAVACHOL) 20 MG tablet; Take 1 tablet (20 mg total) by mouth daily.  Dispense: 90 tablet; Refill: 1  3. Chronic midline thoracic back pain Uncontrolled Placed on tramadol - traMADol (ULTRAM) 50 MG tablet; Take 1 tablet (50 mg total) by mouth every 8 (eight) hours as needed for up to 5 days.  Dispense: 30 tablet; Refill: 1 - tiZANidine (ZANAFLEX) 4 MG tablet; TAKE 1 TABLET BY MOUTH EVERY 12 HOURS AS NEEDED FOR MUSCLE SPASMS  Dispense: 60 tablet; Refill: 2  4. Rash and nonspecific skin eruption  History does not suggest infection We will place on topical steroid - triamcinolone cream (KENALOG) 0.1 %; APPLY 1 APPLICATION TOPICALLY 2 TIMES DAILY  Dispense: 80 g; Refill: 0  5. Rheumatoid arthritis involving multiple sites with positive rheumatoid factor ([place]) Advised to make appointment with rheumatologist - folic acid (FOLVITE) A999333 MCG tablet; Take 1 tablet (400 mcg total) by mouth daily.  Dispense: 90 tablet; Refill: 1 - CBC with Differential/Platelet; Future  6. Other osteoporosis without current pathological fracture Stable - alendronate (FOSAMAX) 70 MG tablet; Take 1 tablet (70 mg total) by mouth every 7 (seven) days. Take with a full glass of water on an empty stomach.  Dispense: 4 tablet; Refill: 6  7. Poor appetite Controlled - mirtazapine (REMERON) 15 MG tablet; Take 1 tablet (15 mg total) by mouth at bedtime.  Dispense: 90 tablet; Refill: 1   Follow Up Instructions: Return in about 3 months (around 09/26/2019) for Chronic medical conditions in person.    I discussed the assessment and treatment plan with the patient. The patient was provided an opportunity to ask questions and all were answered. The patient agreed with the plan and demonstrated an understanding of the instructions.   The patient was advised to call back or seek an in-person evaluation if the symptoms worsen or if the condition fails to  improve as anticipated.     I provided 18 minutes total of non-face-to-face time during this encounter including median intraservice time, reviewing previous notes, investigations, ordering medications, medical decision making, coordinating care and patient verbalized understanding at the end of the visit.     Charlott Rakes, MD, FAAFP. [facility] and [facility] [place], [place]   06/29/2019, 11:16 AM

## 2019-06-29 NOTE — Progress Notes (Signed)
Patient has been called and DOB has been verified. Patient has been screened and transferred to PCP to start phone visit.    Pain in back.  Rash on foot very itchy.

## 2019-06-30 ENCOUNTER — Encounter: Payer: Self-pay | Admitting: Family Medicine

## 2019-07-03 ENCOUNTER — Ambulatory Visit: Payer: Medicare Other | Admitting: Family Medicine

## 2019-07-06 ENCOUNTER — Other Ambulatory Visit: Payer: Medicare Other

## 2019-07-12 ENCOUNTER — Ambulatory Visit: Payer: Medicare Other | Attending: Family Medicine

## 2019-07-12 ENCOUNTER — Other Ambulatory Visit: Payer: Self-pay

## 2019-07-12 DIAGNOSIS — I10 Essential (primary) hypertension: Secondary | ICD-10-CM

## 2019-07-12 DIAGNOSIS — M0579 Rheumatoid arthritis with rheumatoid factor of multiple sites without organ or systems involvement: Secondary | ICD-10-CM

## 2019-07-13 ENCOUNTER — Other Ambulatory Visit: Payer: Self-pay | Admitting: Family Medicine

## 2019-07-13 DIAGNOSIS — E7800 Pure hypercholesterolemia, unspecified: Secondary | ICD-10-CM

## 2019-07-13 LAB — CBC WITH DIFFERENTIAL/PLATELET
Basophils Absolute: 0.1 10*3/uL (ref 0.0–0.2)
Basos: 2 %
EOS (ABSOLUTE): 0.3 10*3/uL (ref 0.0–0.4)
Eos: 4 %
Hematocrit: 40.8 % (ref 34.0–46.6)
Hemoglobin: 13.8 g/dL (ref 11.1–15.9)
Immature Grans (Abs): 0 10*3/uL (ref 0.0–0.1)
Immature Granulocytes: 0 %
Lymphocytes Absolute: 1.7 10*3/uL (ref 0.7–3.1)
Lymphs: 24 %
MCH: 29.9 pg (ref 26.6–33.0)
MCHC: 33.8 g/dL (ref 31.5–35.7)
MCV: 88 fL (ref 79–97)
Monocytes Absolute: 0.7 10*3/uL (ref 0.1–0.9)
Monocytes: 10 %
Neutrophils Absolute: 4.1 10*3/uL (ref 1.4–7.0)
Neutrophils: 60 %
Platelets: 298 10*3/uL (ref 150–450)
RBC: 4.62 x10E6/uL (ref 3.77–5.28)
RDW: 12.4 % (ref 11.7–15.4)
WBC: 6.8 10*3/uL (ref 3.4–10.8)

## 2019-07-13 LAB — BASIC METABOLIC PANEL
BUN/Creatinine Ratio: 9 — ABNORMAL LOW (ref 12–28)
BUN: 7 mg/dL — ABNORMAL LOW (ref 8–27)
CO2: 22 mmol/L (ref 20–29)
Calcium: 9.7 mg/dL (ref 8.7–10.3)
Chloride: 105 mmol/L (ref 96–106)
Creatinine, Ser: 0.8 mg/dL (ref 0.57–1.00)
GFR calc Af Amer: 86 mL/min/{1.73_m2} (ref >59)
GFR calc non Af Amer: 74 mL/min/{1.73_m2} (ref >59)
Glucose: 114 mg/dL — ABNORMAL HIGH (ref 65–99)
Potassium: 3.9 mmol/L (ref 3.5–5.2)
Sodium: 141 mmol/L (ref 134–144)

## 2019-07-13 LAB — LIPID PANEL
Chol/HDL Ratio: 3.1 ratio (ref 0.0–4.4)
Cholesterol, Total: 212 mg/dL — ABNORMAL HIGH (ref 100–199)
HDL: 69 mg/dL (ref >39)
LDL Chol Calc (NIH): 130 mg/dL — ABNORMAL HIGH (ref 0–99)
Triglycerides: 75 mg/dL (ref 0–149)
VLDL Cholesterol Cal: 13 mg/dL (ref 5–40)

## 2019-07-13 MED ORDER — PRAVASTATIN SODIUM 40 MG PO TABS: 40.0000 mg | ORAL_TABLET | Freq: Every day | ORAL | 1 refills | Status: DC

## 2019-09-11 ENCOUNTER — Other Ambulatory Visit: Payer: Self-pay | Admitting: Family Medicine

## 2019-09-11 DIAGNOSIS — R0982 Postnasal drip: Secondary | ICD-10-CM

## 2019-09-11 DIAGNOSIS — J328 Other chronic sinusitis: Secondary | ICD-10-CM

## 2019-10-13 ENCOUNTER — Other Ambulatory Visit: Payer: Self-pay | Admitting: Family Medicine

## 2019-10-13 DIAGNOSIS — R630 Anorexia: Secondary | ICD-10-CM

## 2019-10-13 DIAGNOSIS — E7800 Pure hypercholesterolemia, unspecified: Secondary | ICD-10-CM

## 2019-10-13 DIAGNOSIS — G8929 Other chronic pain: Secondary | ICD-10-CM

## 2019-10-13 DIAGNOSIS — I10 Essential (primary) hypertension: Secondary | ICD-10-CM

## 2019-11-06 ENCOUNTER — Encounter: Payer: Self-pay | Admitting: Family Medicine

## 2019-12-05 ENCOUNTER — Other Ambulatory Visit: Payer: Self-pay

## 2019-12-05 ENCOUNTER — Ambulatory Visit: Payer: Medicare Other | Attending: Family Medicine | Admitting: Family Medicine

## 2019-12-05 ENCOUNTER — Ambulatory Visit: Payer: Medicare Other | Admitting: Family Medicine

## 2019-12-05 ENCOUNTER — Encounter: Payer: Self-pay | Admitting: Family Medicine

## 2019-12-05 VITALS — BP 134/89 | HR 78 | Ht 62.0 in | Wt 119.0 lb

## 2019-12-05 DIAGNOSIS — R009 Unspecified abnormalities of heart beat: Secondary | ICD-10-CM | POA: Diagnosis not present

## 2019-12-05 DIAGNOSIS — Z6841 Body Mass Index (BMI) 40.0 and over, adult: Secondary | ICD-10-CM | POA: Insufficient documentation

## 2019-12-05 DIAGNOSIS — M419 Scoliosis, unspecified: Secondary | ICD-10-CM | POA: Insufficient documentation

## 2019-12-05 DIAGNOSIS — M818 Other osteoporosis without current pathological fracture: Secondary | ICD-10-CM | POA: Diagnosis not present

## 2019-12-05 DIAGNOSIS — J45909 Unspecified asthma, uncomplicated: Secondary | ICD-10-CM | POA: Diagnosis not present

## 2019-12-05 DIAGNOSIS — M0579 Rheumatoid arthritis with rheumatoid factor of multiple sites without organ or systems involvement: Secondary | ICD-10-CM | POA: Insufficient documentation

## 2019-12-05 DIAGNOSIS — Z7901 Long term (current) use of anticoagulants: Secondary | ICD-10-CM | POA: Diagnosis not present

## 2019-12-05 DIAGNOSIS — Z79899 Other long term (current) drug therapy: Secondary | ICD-10-CM | POA: Insufficient documentation

## 2019-12-05 DIAGNOSIS — E785 Hyperlipidemia, unspecified: Secondary | ICD-10-CM | POA: Insufficient documentation

## 2019-12-05 DIAGNOSIS — E7800 Pure hypercholesterolemia, unspecified: Secondary | ICD-10-CM | POA: Insufficient documentation

## 2019-12-05 DIAGNOSIS — G4700 Insomnia, unspecified: Secondary | ICD-10-CM | POA: Insufficient documentation

## 2019-12-05 DIAGNOSIS — I10 Essential (primary) hypertension: Secondary | ICD-10-CM | POA: Diagnosis present

## 2019-12-05 DIAGNOSIS — R630 Anorexia: Secondary | ICD-10-CM | POA: Diagnosis not present

## 2019-12-05 MED ORDER — MIRTAZAPINE 30 MG PO TABS: 30.0000 mg | ORAL_TABLET | Freq: Every day | ORAL | 1 refills | Status: DC

## 2019-12-05 MED ORDER — AMLODIPINE BESYLATE 10 MG PO TABS: ORAL_TABLET | ORAL | 1 refills | Status: DC

## 2019-12-05 MED ORDER — CARVEDILOL 6.25 MG PO TABS: ORAL_TABLET | ORAL | 1 refills | Status: DC

## 2019-12-05 MED ORDER — ALENDRONATE SODIUM 70 MG PO TABS: 70.0000 mg | ORAL_TABLET | ORAL | 6 refills | Status: DC

## 2019-12-05 MED ORDER — FOLIC ACID 400 MCG PO TABS: 400.0000 ug | ORAL_TABLET | Freq: Every day | ORAL | 1 refills | Status: DC

## 2019-12-05 MED ORDER — DICLOFENAC SODIUM 1 % EX GEL: 4.0000 g | Freq: Four times a day (QID) | CUTANEOUS | 1 refills | Status: DC

## 2019-12-05 NOTE — Progress Notes (Signed)
having pain in back and legs.  States that she feel her heart skip a beat, no pains.  Still having stiffness, not eating or sleeping.

## 2019-12-05 NOTE — Progress Notes (Signed)
Subjective:  Patient ID: Kristy Oliver, female    DOB: [birth year]  Age: 74 y.o. MRN: [id number]  CC: Arthritis   HPI Kelise Kuch is a 74 year old female with a past medical history of hypertensiton and rheumatoid arthritis, osteoporosis, left hip, left humeral fracture status post nailing of left hip in 03/2018 who presents today for follow-up visit.  For the last one month she has noticed her heart skipping and states she sometimes thinks it stops.  Denies presence of chest pain, dyspnea.  Complains of insomnia and then is fatigued during the day and then tries to take Naps.  Denies intake of caffeinated products.  Also complains of decreased appetite and was placed on Remeron 15 mg in the past. She is constantly in pain in her upper throacic spine where she has her kyphoscoliosis.  Rheumatoid arthritis is managed by rheumatology and she is currently on [place]. Endorses compliance with her statin, antihypertensive and Fosamax for osteoporosis.  Past Medical History:  Diagnosis Date  . Allergy   . Arthritis   . Asthma    as young person, none currently  . Facial cellulitis 11/17/2012  . Gout   . Helicobacter pylori gastritis 12/13/2016  . HTN (hypertension), malignant 11/17/2012  . Hx of adenomatous colonic polyps 12/29/2016  . Hyperlipidemia    medicine currently  . Hypertension   . Osteoporosis   . Renal disorder   . Tuberculosis    pt took meds for 9 months - finish meds last week     Past Surgical History:  Procedure Laterality Date  . BREAST LUMPECTOMY    . c-section x 2    . COLONOSCOPY    . JOINT REPLACEMENT     left knee & left shoulder  . left breast cyst removal    . UPPER GASTROINTESTINAL ENDOSCOPY    . WRIST SURGERY  2018   x2    Family History  Problem Relation Age of Onset  . Colon cancer Neg Hx   . Esophageal cancer Neg Hx   . Rectal cancer Neg Hx   . Stomach cancer Neg Hx     Allergies  Allergen Reactions  . Clindamycin Hives, Rash and  Swelling  . Metrizamide     unknown  . Motrin [Ibuprofen] Other (See Comments)    blackout States she is only allergic to 800 mg formulation  . Iodinated Diagnostic Agents Rash    unknown unknown  . Milk-Related Compounds Diarrhea  . Shrimp [Shellfish Allergy] Hives, Swelling and Rash    Outpatient Medications Prior to Visit  Medication Sig Dispense Refill  . cetirizine (ZYRTEC) 10 MG tablet TAKE 1 TABLET BY MOUTH ONCE DAILY 30 tablet 2  . fluticasone (FLONASE) 50 MCG/ACT nasal spray INSTILL 2 SPRAYS INTO EACH NOSTRIL DAILY 16 mL 1  . leflunomide (ARAVA) 10 MG tablet Take 10 mg by mouth daily.    . pravastatin (PRAVACHOL) 40 MG tablet TAKE 1 TABLET BY MOUTH EVERY DAY 30 tablet 6  . tiZANidine (ZANAFLEX) 4 MG tablet TAKE 1 TABLET BY MOUTH EVERY 12 HOURS AS NEEDED FOR MUSCLE SPASM 60 tablet 2  . triamcinolone cream (KENALOG) 0.1 % APPLY 1 APPLICATION TOPICALLY 2 TIMES DAILY 80 g 0  . alendronate (FOSAMAX) 70 MG tablet Take 1 tablet (70 mg total) by mouth every 7 (seven) days. Take with a full glass of water on an empty stomach. 4 tablet 6  . amLODipine (NORVASC) 10 MG tablet TAKE 1 TABLET (10MG TOTAL) BY MOUTH EVERY  DAY 30 tablet 3  . carvedilol (COREG) 6.25 MG tablet TAKE 1 TABLET BY MOUTH TWICE DAILY WITH A MEAL 60 tablet 3  . folic acid (FOLVITE) 007 MCG tablet Take 1 tablet (400 mcg total) by mouth daily. 90 tablet 1  . mirtazapine (REMERON) 15 MG tablet TAKE 1 TABLET (15MG TOTAL) BY MOUTH EVERY NIGHT AT BEDTIME 30 tablet 2  . doxycycline (VIBRA-TABS) 100 MG tablet Take 1 tablet (100 mg total) by mouth 2 (two) times daily. (Patient not taking: Reported on 06/29/2019) 20 tablet 0  . mupirocin ointment (BACTROBAN) 2 % Apply 1 application topically 2 (two) times daily. (Patient not taking: Reported on 12/05/2019) 30 g 2   Facility-Administered Medications Prior to Visit  Medication Dose Route Frequency Provider Last Rate Last Admin  . 0.9 %  sodium chloride infusion  500 mL Intravenous  Continuous Gatha Mayer, MD         ROS Review of Systems  Constitutional: Positive for appetite change. Negative for activity change and fatigue.  HENT: Negative for congestion, sinus pressure and sore throat.   Eyes: Negative for visual disturbance.  Respiratory: Negative for cough, chest tightness, shortness of breath and wheezing.   Cardiovascular: Negative for chest pain and palpitations.  Gastrointestinal: Negative for abdominal distention, abdominal pain and constipation.  Endocrine: Negative for polydipsia.  Genitourinary: Negative for dysuria and frequency.  Musculoskeletal: Positive for back pain. Negative for arthralgias.  Skin: Positive for rash.  Neurological: Negative for tremors, light-headedness and numbness.  Hematological: Does not bruise/bleed easily.  Psychiatric/Behavioral: Positive for sleep disturbance. Negative for agitation and behavioral problems.    Objective:  BP 134/89   Pulse 78   Ht 5' 2"  (1.575 m)   Wt 119 lb (54 kg)   SpO2 98%   BMI 21.77 kg/m   BP/Weight 12/05/2019 10/25/2631 07/27/4560  Systolic BP 563 893 734  Diastolic BP 89 97 74  Wt. (Lbs) 119 118 117  BMI 21.77 21.58 21.4      Physical Exam Constitutional:      Appearance: She is well-developed.  Neck:     Vascular: No JVD.  Cardiovascular:     Rate and Rhythm: Normal rate.     Heart sounds: Normal heart sounds. No murmur heard.   Pulmonary:     Effort: Pulmonary effort is normal.     Breath sounds: Normal breath sounds. No wheezing or rales.  Chest:     Chest wall: No tenderness.  Abdominal:     General: Bowel sounds are normal. There is no distension.     Palpations: Abdomen is soft. There is no mass.     Tenderness: There is no abdominal tenderness.  Musculoskeletal:        General: Deformity (kyphoscoliosis of thoracic spine) present.     Right lower leg: No edema.     Left lower leg: No edema.     Comments: Rheumatoid nodules in multiple joints  Skin:     Comments: Diffuse rash in upper extremity and lower extremity with scars  Neurological:     Mental Status: She is alert and oriented to person, place, and time.  Psychiatric:        Mood and Affect: Mood normal.     CMP Latest Ref Rng & Units 12/05/2019 07/12/2019 01/31/2019  Glucose 65 - 99 mg/dL 99 114(H) 129(H)  BUN 8 - 27 mg/dL 11 7(L) 6(L)  Creatinine 0.57 - 1.00 mg/dL 0.65 0.80 0.74  Sodium 134 - 144 mmol/L 139  141 141  Potassium 3.5 - 5.2 mmol/L 3.8 3.9 3.9  Chloride 96 - 106 mmol/L 103 105 104  CO2 20 - 29 mmol/L 20 22 22   Calcium 8.7 - 10.3 mg/dL 9.8 9.7 9.8  Total Protein 6.0 - 8.5 g/dL 8.4 - -  Total Bilirubin 0.0 - 1.2 mg/dL 0.3 - -  Alkaline Phos 48 - 121 IU/L 303(H) - -  AST 0 - 40 IU/L 70(H) - -  ALT 0 - 32 IU/L 32 - -    Lipid Panel     Component Value Date/Time   CHOL 237 (H) 12/05/2019 1138   TRIG 92 12/05/2019 1138   HDL 72 12/05/2019 1138   CHOLHDL 3.1 07/12/2019 1139   CHOLHDL 2.6 07/02/2015 0915   VLDL 20 07/02/2015 0915   LDLCALC 149 (H) 12/05/2019 1138    CBC    Component Value Date/Time   WBC 7.7 12/05/2019 1138   WBC 7.7 08/20/2017 1526   RBC 4.85 12/05/2019 1138   RBC 4.51 08/20/2017 1526   HGB 14.9 12/05/2019 1138   HCT 44.3 12/05/2019 1138   PLT 216 12/05/2019 1138   MCV 91 12/05/2019 1138   MCH 30.7 12/05/2019 1138   MCH 30.6 08/20/2017 1526   MCHC 33.6 12/05/2019 1138   MCHC 34.3 08/20/2017 1526   RDW 12.8 12/05/2019 1138   LYMPHSABS 1.6 12/05/2019 1138   MONOABS 1,197 (H) 05/11/2016 0816   EOSABS 0.2 12/05/2019 1138   BASOSABS 0.1 12/05/2019 1138    Lab Results  Component Value Date   HGBA1C 5.5 11/02/2016    Assessment & Plan:  1. Poor appetite Increase Remeron dose - mirtazapine (REMERON) 30 MG tablet; Take 1 tablet (30 mg total) by mouth at bedtime.  Dispense: 90 tablet; Refill: 1  2. Other osteoporosis without current pathological fracture Stable - alendronate (FOSAMAX) 70 MG tablet; Take 1 tablet (70 mg total) by  mouth every 7 (seven) days. Take with a full glass of water on an empty stomach.  Dispense: 4 tablet; Refill: 6  3. Essential hypertension Controlled Counseled on blood pressure goal of less than 130/80, low-sodium, DASH diet, medication compliance, 150 minutes of moderate intensity exercise per week. Discussed medication compliance, adverse effects. - LP+Non-HDL Cholesterol - CMP14+EGFR - amLODipine (NORVASC) 10 MG tablet; TAKE 1 TABLET (10MG TOTAL) BY MOUTH EVERY DAY  Dispense: 90 tablet; Refill: 1 - carvedilol (COREG) 6.25 MG tablet; TAKE 1 TABLET BY MOUTH TWICE DAILY WITH A MEAL  Dispense: 180 tablet; Refill: 1  4. Rheumatoid arthritis involving multiple sites with positive rheumatoid factor ([place]) End-stage rheumatology multiple rheumatoid nodules Continue Arava Followed by rheumatology - CBC with Differential/Platelet - folic acid (FOLVITE) 517 MCG tablet; Take 1 tablet (400 mcg total) by mouth daily.  Dispense: 90 tablet; Refill: 1  5. Pure hypercholesterolemia Uncontrolled We will send of lipid panel Low-cholesterol diet Continue pravastatin  6. Heart beat abnormality EKG today reveals normal sinus rhythm, no ST changes If symptoms recur advised to contact the clinic and I will refer her to cardiology for consideration for event monitor  7. Kyphoscoliosis With underlying previous compression fracture Previously seen by spine surgery with no additional recommendations - diclofenac Sodium (VOLTAREN) 1 % GEL; Apply 4 g topically 4 (four) times daily.  Dispense: 100 g; Refill: 1    Meds ordered this encounter  Medications  . mirtazapine (REMERON) 30 MG tablet    Sig: Take 1 tablet (30 mg total) by mouth at bedtime.    Dispense:  90  tablet    Refill:  1  . diclofenac Sodium (VOLTAREN) 1 % GEL    Sig: Apply 4 g topically 4 (four) times daily.    Dispense:  100 g    Refill:  1  . alendronate (FOSAMAX) 70 MG tablet    Sig: Take 1 tablet (70 mg total) by mouth every 7  (seven) days. Take with a full glass of water on an empty stomach.    Dispense:  4 tablet    Refill:  6  . amLODipine (NORVASC) 10 MG tablet    Sig: TAKE 1 TABLET (10MG TOTAL) BY MOUTH EVERY DAY    Dispense:  90 tablet    Refill:  1  . carvedilol (COREG) 6.25 MG tablet    Sig: TAKE 1 TABLET BY MOUTH TWICE DAILY WITH A MEAL    Dispense:  180 tablet    Refill:  1  . folic acid (FOLVITE) 454 MCG tablet    Sig: Take 1 tablet (400 mcg total) by mouth daily.    Dispense:  90 tablet    Refill:  1    Follow-up: Return in about 6 months (around 06/06/2020) for Chronic disease management.       Charlott Rakes, MD, FAAFP. [facility] and [place], [place]   12/06/2019, 8:20 AM

## 2019-12-06 LAB — CBC WITH DIFFERENTIAL/PLATELET
Basophils Absolute: 0.1 10*3/uL (ref 0.0–0.2)
Basos: 1 %
EOS (ABSOLUTE): 0.2 10*3/uL (ref 0.0–0.4)
Eos: 3 %
Hematocrit: 44.3 % (ref 34.0–46.6)
Hemoglobin: 14.9 g/dL (ref 11.1–15.9)
Immature Grans (Abs): 0 10*3/uL (ref 0.0–0.1)
Immature Granulocytes: 0 %
Lymphocytes Absolute: 1.6 10*3/uL (ref 0.7–3.1)
Lymphs: 21 %
MCH: 30.7 pg (ref 26.6–33.0)
MCHC: 33.6 g/dL (ref 31.5–35.7)
MCV: 91 fL (ref 79–97)
Monocytes Absolute: 0.4 10*3/uL (ref 0.1–0.9)
Monocytes: 6 %
Neutrophils Absolute: 5.4 10*3/uL (ref 1.4–7.0)
Neutrophils: 69 %
Platelets: 216 10*3/uL (ref 150–450)
RBC: 4.85 x10E6/uL (ref 3.77–5.28)
RDW: 12.8 % (ref 11.7–15.4)
WBC: 7.7 10*3/uL (ref 3.4–10.8)

## 2019-12-06 LAB — CMP14+EGFR
ALT: 32 IU/L (ref 0–32)
AST: 70 IU/L — ABNORMAL HIGH (ref 0–40)
Albumin/Globulin Ratio: 1.1 — ABNORMAL LOW (ref 1.2–2.2)
Albumin: 4.4 g/dL (ref 3.7–4.7)
Alkaline Phosphatase: 303 IU/L — ABNORMAL HIGH (ref 48–121)
BUN/Creatinine Ratio: 17 (ref 12–28)
BUN: 11 mg/dL (ref 8–27)
Bilirubin Total: 0.3 mg/dL (ref 0.0–1.2)
CO2: 20 mmol/L (ref 20–29)
Calcium: 9.8 mg/dL (ref 8.7–10.3)
Chloride: 103 mmol/L (ref 96–106)
Creatinine, Ser: 0.65 mg/dL (ref 0.57–1.00)
GFR calc Af Amer: 103 mL/min/{1.73_m2} (ref >59)
GFR calc non Af Amer: 90 mL/min/{1.73_m2} (ref >59)
Globulin, Total: 4 g/dL (ref 1.5–4.5)
Glucose: 99 mg/dL (ref 65–99)
Potassium: 3.8 mmol/L (ref 3.5–5.2)
Sodium: 139 mmol/L (ref 134–144)
Total Protein: 8.4 g/dL (ref 6.0–8.5)

## 2019-12-06 LAB — LP+NON-HDL CHOLESTEROL
Cholesterol, Total: 237 mg/dL — ABNORMAL HIGH (ref 100–199)
HDL: 72 mg/dL (ref >39)
LDL Chol Calc (NIH): 149 mg/dL — ABNORMAL HIGH (ref 0–99)
Total Non-HDL-Chol (LDL+VLDL): 165 mg/dL — ABNORMAL HIGH (ref 0–129)
Triglycerides: 92 mg/dL (ref 0–149)
VLDL Cholesterol Cal: 16 mg/dL (ref 5–40)

## 2019-12-06 MED ORDER — ATORVASTATIN CALCIUM 20 MG PO TABS: 20.0000 mg | ORAL_TABLET | Freq: Every day | ORAL | 6 refills | Status: DC

## 2019-12-07 ENCOUNTER — Telehealth: Payer: Self-pay

## 2019-12-07 NOTE — Telephone Encounter (Signed)
Patient was called and a voicemail was left informing patient to return phone call for lab results. 

## 2019-12-07 NOTE — Telephone Encounter (Signed)
-----   Message from Charlott Rakes, MD sent at 12/06/2019  8:22 AM EDT ----- Cholesterol is elevated.  I have switched her from pravastatin to atorvastatin.  One of her enzymes is elevated due to ongoing chronic bone pains.  Other labs are stable.

## 2019-12-11 ENCOUNTER — Other Ambulatory Visit: Payer: Self-pay | Admitting: Family Medicine

## 2019-12-11 DIAGNOSIS — R630 Anorexia: Secondary | ICD-10-CM

## 2019-12-11 DIAGNOSIS — M419 Scoliosis, unspecified: Secondary | ICD-10-CM

## 2019-12-11 DIAGNOSIS — R0982 Postnasal drip: Secondary | ICD-10-CM

## 2019-12-11 DIAGNOSIS — I10 Essential (primary) hypertension: Secondary | ICD-10-CM

## 2019-12-11 NOTE — Telephone Encounter (Signed)
Requested Prescriptions  Pending Prescriptions Disp Refills   diclofenac Sodium (VOLTAREN) 1 % GEL [Pharmacy Med Name: Diclofenac Sodium External Gel 1 % 1 %  Gel] 100 g 1    Sig: APPLY 4 G TOPICALLY 4 (FOUR) TIMES DAILY.     Analgesics:  Topicals Passed - 12/11/2019 11:45 AM      Passed - Valid encounter within last 12 months    Recent Outpatient Visits          6 days ago Heart beat abnormality   [place], [place], MD   5 months ago Rheumatoid arthritis involving multiple sites with positive rheumatoid factor ([place])   [place], [place], MD   10 months ago Plantar callus   [place], En[place], MD   1 year ago [place], Charlane Ferretti, MD   1 year ago Rheumatoid arthritis involving multiple sites with positive rheumatoid factor [facility])   [facility] Charlott Rakes, MD

## 2019-12-14 NOTE — Telephone Encounter (Signed)
Copied from [place] [id number]. Topic: General - Other >> Dec 14, 2019  2:59 PM Leward Quan A wrote: Reason for CRM: Isaiah Serge patients granddaughter called to ask for a nurse to give her a call to go over patients medication she need to know exactly what medication the patient should be taking. Please call [name] at  Ph# [phone]

## 2019-12-18 ENCOUNTER — Encounter: Payer: Self-pay | Admitting: Family Medicine

## 2019-12-19 NOTE — Telephone Encounter (Signed)
Call was placed to patient and a voicemail was left informing patient to return phone call. 

## 2020-01-02 ENCOUNTER — Encounter: Payer: Self-pay | Admitting: Family Medicine

## 2020-01-02 ENCOUNTER — Telehealth: Payer: Self-pay | Admitting: Family Medicine

## 2020-01-02 NOTE — Telephone Encounter (Signed)
Copied from [place] [id number]. Topic: General - Other >> Jan 02, 2020  1:23 PM Rainey Pines A wrote: Ramiro Harvest is requesting a list of patients current medications be faxed to [phone]. Patient was unsure which medications they should be taking.

## 2020-01-03 NOTE — Telephone Encounter (Signed)
Medications list can not be faxed without medical release.

## 2020-01-04 ENCOUNTER — Telehealth: Payer: Self-pay | Admitting: Family Medicine

## 2020-01-04 NOTE — Telephone Encounter (Signed)
Patient's relative called to request a medical release for patient's medication list, which needs to be faxed.  Please advise and call today so that this can be updated.  CB# [phone]

## 2020-01-09 NOTE — Telephone Encounter (Signed)
Updated med list has been faxed and copy has been placed upfront for pick up

## 2020-01-09 NOTE — Telephone Encounter (Signed)
Called pt grandson Decario. DPR on file. Consulting with pharmacy to be certain active medication list. Per Decario past meds from Dr. Margarita Rana were changed but pt is still receiving those. PLEASE FAX UPDATED MED LIST TO [facility] in [place].  Please call pt grandson to assist with correct medication list. Mikki [place] also wants updated list on hand. [phone]

## 2020-02-12 ENCOUNTER — Other Ambulatory Visit: Payer: Self-pay | Admitting: Family Medicine

## 2020-02-12 DIAGNOSIS — R0982 Postnasal drip: Secondary | ICD-10-CM

## 2020-02-12 DIAGNOSIS — M419 Scoliosis, unspecified: Secondary | ICD-10-CM

## 2020-02-12 NOTE — Telephone Encounter (Signed)
Requested Prescriptions  Pending Prescriptions Disp Refills   cetirizine (ZYRTEC) 10 MG tablet [Pharmacy Med Name: Cetirizine HCl Oral Tablet 10 MG 10 MG  Tablet] 90 tablet 0    Sig: TAKE 1 TABLET BY MOUTH ONCE DAILY     Ear, Nose, and Throat:  Antihistamines Passed - 02/12/2020 12:36 PM      Passed - Valid encounter within last 12 months    Recent Outpatient Visits          2 months ago Heart beat abnormality   [place], [place], MD   7 months ago Rheumatoid arthritis involving multiple sites with positive rheumatoid factor ([place])   [place], [place]ong, MD   1 year ago Plantar callus   [place], Enobong, MD   1 year ago [place], Charlane Ferretti, MD   1 year ago Rheumatoid arthritis involving multiple sites with positive rheumatoid factor [facility])   [facility] Charlott Rakes, MD

## 2020-03-07 ENCOUNTER — Other Ambulatory Visit: Payer: Self-pay | Admitting: Family Medicine

## 2020-03-07 DIAGNOSIS — I10 Essential (primary) hypertension: Secondary | ICD-10-CM

## 2020-03-07 DIAGNOSIS — R630 Anorexia: Secondary | ICD-10-CM

## 2020-03-07 DIAGNOSIS — R0982 Postnasal drip: Secondary | ICD-10-CM

## 2020-03-07 NOTE — Telephone Encounter (Signed)
Per pharmacy the patient has one 90 day refill on file therefore refusing this request at this time.

## 2020-03-07 NOTE — Telephone Encounter (Signed)
Per pharmacy patient has one month's refill for Coreg 6.25mg   64 tabs of Norvasc 10 mg and two 30 day refills on Zyrtec. Patient receives 90 packages therefore refilling all for 90 days at this time with no refills.

## 2020-03-08 ENCOUNTER — Other Ambulatory Visit: Payer: Self-pay | Admitting: Family Medicine

## 2020-03-08 DIAGNOSIS — I10 Essential (primary) hypertension: Secondary | ICD-10-CM

## 2020-03-09 ENCOUNTER — Ambulatory Visit: Payer: Medicare Other

## 2020-03-13 ENCOUNTER — Other Ambulatory Visit: Payer: Self-pay | Admitting: Family Medicine

## 2020-03-13 DIAGNOSIS — I10 Essential (primary) hypertension: Secondary | ICD-10-CM

## 2020-03-13 DIAGNOSIS — R0982 Postnasal drip: Secondary | ICD-10-CM

## 2020-04-08 ENCOUNTER — Other Ambulatory Visit: Payer: Self-pay | Admitting: Family Medicine

## 2020-04-08 DIAGNOSIS — M419 Scoliosis, unspecified: Secondary | ICD-10-CM

## 2020-04-08 DIAGNOSIS — I10 Essential (primary) hypertension: Secondary | ICD-10-CM

## 2020-04-08 DIAGNOSIS — J328 Other chronic sinusitis: Secondary | ICD-10-CM

## 2020-04-08 DIAGNOSIS — R630 Anorexia: Secondary | ICD-10-CM

## 2020-04-08 NOTE — Telephone Encounter (Addendum)
Call to pharmacy- whole request was refills inadvertently while trying to separate Rx-  amLODipine (NORVASC) 10 MG tablet, carvedilol (COREG) 6.25 MG, tabletmirtazapine (REMERON) 30 MG tablet all canceled- they all are current and have RF. Remaining request for fluticasone (FLONASE) 50 MCG/ACT nasal spray and diclofenac Sodium (VOLTAREN) 1 % GEL  Granted. Sorry for the confusion

## 2020-04-09 ENCOUNTER — Other Ambulatory Visit: Payer: Self-pay | Admitting: Family Medicine

## 2020-04-09 DIAGNOSIS — I10 Essential (primary) hypertension: Secondary | ICD-10-CM

## 2020-04-11 ENCOUNTER — Other Ambulatory Visit: Payer: Self-pay | Admitting: Family Medicine

## 2020-04-11 DIAGNOSIS — I10 Essential (primary) hypertension: Secondary | ICD-10-CM

## 2020-04-12 ENCOUNTER — Other Ambulatory Visit: Payer: Self-pay | Admitting: Family Medicine

## 2020-04-12 DIAGNOSIS — I10 Essential (primary) hypertension: Secondary | ICD-10-CM

## 2020-04-12 DIAGNOSIS — R630 Anorexia: Secondary | ICD-10-CM

## 2020-04-15 ENCOUNTER — Other Ambulatory Visit: Payer: Self-pay | Admitting: Family Medicine

## 2020-04-15 DIAGNOSIS — R630 Anorexia: Secondary | ICD-10-CM

## 2020-04-15 DIAGNOSIS — I10 Essential (primary) hypertension: Secondary | ICD-10-CM

## 2020-05-03 ENCOUNTER — Other Ambulatory Visit: Payer: Self-pay | Admitting: Family Medicine

## 2020-05-03 DIAGNOSIS — R630 Anorexia: Secondary | ICD-10-CM

## 2020-05-03 DIAGNOSIS — I10 Essential (primary) hypertension: Secondary | ICD-10-CM

## 2020-05-03 DIAGNOSIS — J328 Other chronic sinusitis: Secondary | ICD-10-CM

## 2020-05-06 ENCOUNTER — Other Ambulatory Visit: Payer: Self-pay | Admitting: Family Medicine

## 2020-05-06 DIAGNOSIS — R630 Anorexia: Secondary | ICD-10-CM

## 2020-05-09 ENCOUNTER — Other Ambulatory Visit: Payer: Self-pay | Admitting: Family Medicine

## 2020-05-09 DIAGNOSIS — I10 Essential (primary) hypertension: Secondary | ICD-10-CM

## 2020-06-03 ENCOUNTER — Ambulatory Visit: Payer: Medicare Other | Attending: Family Medicine | Admitting: Family Medicine

## 2020-06-03 ENCOUNTER — Other Ambulatory Visit: Payer: Self-pay

## 2020-06-03 ENCOUNTER — Encounter: Payer: Self-pay | Admitting: Family Medicine

## 2020-06-03 VITALS — BP 193/101 | HR 68 | Ht 62.0 in | Wt 124.0 lb

## 2020-06-03 DIAGNOSIS — I10 Essential (primary) hypertension: Secondary | ICD-10-CM | POA: Diagnosis not present

## 2020-06-03 DIAGNOSIS — M818 Other osteoporosis without current pathological fracture: Secondary | ICD-10-CM | POA: Diagnosis not present

## 2020-06-03 DIAGNOSIS — G44209 Tension-type headache, unspecified, not intractable: Secondary | ICD-10-CM

## 2020-06-03 DIAGNOSIS — S329XXD Fracture of unspecified parts of lumbosacral spine and pelvis, subsequent encounter for fracture with routine healing: Secondary | ICD-10-CM | POA: Diagnosis not present

## 2020-06-03 DIAGNOSIS — R630 Anorexia: Secondary | ICD-10-CM | POA: Diagnosis not present

## 2020-06-03 DIAGNOSIS — M0579 Rheumatoid arthritis with rheumatoid factor of multiple sites without organ or systems involvement: Secondary | ICD-10-CM

## 2020-06-03 DIAGNOSIS — N3941 Urge incontinence: Secondary | ICD-10-CM

## 2020-06-03 MED ORDER — AMLODIPINE BESYLATE 10 MG PO TABS
10.0000 mg | ORAL_TABLET | Freq: Every day | ORAL | 1 refills | Status: DC
Start: 2020-06-03 — End: 2021-04-10

## 2020-06-03 MED ORDER — ALENDRONATE SODIUM 70 MG PO TABS: 70.0000 mg | ORAL_TABLET | ORAL | 6 refills | Status: DC

## 2020-06-03 MED ORDER — OXYBUTYNIN CHLORIDE 5 MG PO TABS: 5.0000 mg | ORAL_TABLET | Freq: Two times a day (BID) | ORAL | 3 refills | Status: DC

## 2020-06-03 MED ORDER — MIRTAZAPINE 30 MG PO TABS: 30.0000 mg | ORAL_TABLET | Freq: Every day | ORAL | 1 refills | Status: DC

## 2020-06-03 MED ORDER — FOLIC ACID 400 MCG PO TABS
400.0000 ug | ORAL_TABLET | Freq: Every day | ORAL | 1 refills | Status: DC
Start: 2020-06-03 — End: 2022-04-13

## 2020-06-03 MED ORDER — BUTALBITAL-APAP-CAFFEINE 50-325-40 MG PO TABS: 1.0000 | ORAL_TABLET | Freq: Four times a day (QID) | ORAL | 1 refills | Status: AC | PRN

## 2020-06-03 MED ORDER — CARVEDILOL 6.25 MG PO TABS: ORAL_TABLET | ORAL | 1 refills | Status: DC

## 2020-06-03 NOTE — Progress Notes (Signed)
Had a fall on 04/10/20 and was admitted to the hospital. Frequent urination can not control. Not sleeping Has pain in back of head.

## 2020-06-03 NOTE — Patient Instructions (Signed)

## 2020-06-03 NOTE — Progress Notes (Signed)
Virtual Visit via Video Note  I connected with [name], on 06/03/2020 at 10:09 AM by video enabled telemedicine device due to the COVID-19 pandemic and verified that I am speaking with the correct person using two identifiers.   Consent: I discussed the limitations, risks, security and privacy concerns of performing an evaluation and management service by telemedicine and the availability of in person appointments. I also discussed with the patient that there may be a patient responsible charge related to this service. The patient expressed understanding and agreed to proceed.   Location of Patient: Clinic  Location of Provider: Home   Persons participating in Telemedicine visit: Kristy Oliver-CMA Dr. Margarita Rana     History of Present Illness: Kristy Oliver is a 75 year old female with a past medical history of hypertensiton and rheumatoid arthritis, osteoporosis, left hip, left humeral fracture status post nailing of left hip in 03/2018, pelvic fracture in 03/2020 here for chronic disease management accompanied by a family member.  She sustained a pelvic fracture after a fall and was managed at [facility] for a superior and inferior pubic ramus fracture, sacral ala fracture of right pelvis. Seen by Ortho and weight bearing as tolerated, PT and outpatient follow up with Ortho recommended She has not had an appointment with Orthopedic but she is underging therapy at home.  She has occipital headaches which is intermittent and rated as moderate. Denies presence of photophobia, phonophobia.Of note her Bp is significantly elevated and she is yet to take her antihypertensive. Denies presence of blurry vision or chest pain Complains of insomnia which is uncontrolled on Remeron. She sleeps all day per her family member who is here with her.  Still follows up with Rheumatology for management of Rheumatoid Arthritis She has urinary incontinence  describes as urge incontinence.  Past Medical History:  Diagnosis Date  . Allergy   . Arthritis   . Asthma    as young person, none currently  . Facial cellulitis 11/17/2012  . Gout   . Helicobacter pylori gastritis 12/13/2016  . HTN (hypertension), malignant 11/17/2012  . Hx of adenomatous colonic polyps 12/29/2016  . Hyperlipidemia    medicine currently  . Hypertension   . Osteoporosis   . Renal disorder   . Tuberculosis    pt took meds for 9 months - finish meds last week    Allergies  Allergen Reactions  . Clindamycin Hives, Rash and Swelling  . Metrizamide     unknown  . Motrin [Ibuprofen] Other (See Comments)    blackout States she is only allergic to 800 mg formulation  . Iodinated Diagnostic Agents Rash    unknown unknown  . Milk-Related Compounds Diarrhea  . Shrimp [Shellfish Allergy] Hives, Swelling and Rash    Current Outpatient Medications on File Prior to Visit  Medication Sig Dispense Refill  . alendronate (FOSAMAX) 70 MG tablet Take 1 tablet (70 mg total) by mouth every 7 (seven) days. Take with a full glass of water on an empty stomach. 4 tablet 6  . amLODipine (NORVASC) 10 MG tablet TAKE 1 TABLET (10MG TOTAL) BY MOUTH EVERY DAY 90 tablet 0  . atorvastatin (LIPITOR) 20 MG tablet Take 1 tablet (20 mg total) by mouth daily. 30 tablet 6  . carvedilol (COREG) 6.25 MG tablet TAKE 1 TABLET BY MOUTH 2 TIMES A DAY WITH MEALS 56 tablet 0  . cetirizine (ZYRTEC) 10 MG tablet TAKE 1 TABLET BY MOUTH ONCE DAILY 28 tablet 3  .  diclofenac Sodium (VOLTAREN) 1 % GEL APPLY 4 GRAMS TOPICALLY 4 (FOUR) TIMES DAILY. 100 g 1  . fluticasone (FLONASE) 50 MCG/ACT nasal spray INSTILL 2 SPRAYS INTO EACH NOSTRIL DAILY 16 mL 1  . folic acid (FOLVITE) 032 MCG tablet Take 1 tablet (400 mcg total) by mouth daily. 90 tablet 1  . methotrexate (RHEUMATREX) 2.5 MG tablet Take 3 tablets by mouth weekly.    . mirtazapine (REMERON) 30 MG tablet TAKE 1 TABLET (30 MG TOTAL) BY MOUTH AT BEDTIME. 28  tablet 0  . tiZANidine (ZANAFLEX) 4 MG tablet TAKE 1 TABLET BY MOUTH EVERY 12 HOURS AS NEEDED FOR MUSCLE SPASM 60 tablet 2  . traMADol (ULTRAM) 50 MG tablet Take 50 mg by mouth every 8 (eight) hours as needed.    . triamcinolone cream (KENALOG) 0.1 % APPLY 1 APPLICATION TOPICALLY 2 TIMES DAILY 80 g 0  . doxycycline (VIBRA-TABS) 100 MG tablet Take 1 tablet (100 mg total) by mouth 2 (two) times daily. (Patient not taking: Reported on 06/29/2019) 20 tablet 0  . leflunomide (ARAVA) 10 MG tablet Take 10 mg by mouth daily. (Patient not taking: Reported on 06/03/2020)    . mupirocin ointment (BACTROBAN) 2 % Apply 1 application topically 2 (two) times daily. (Patient not taking: No sig reported) 30 g 2   Current Facility-Administered Medications on File Prior to Visit  Medication Dose Route Frequency Provider Last Rate Last Admin  . 0.9 %  sodium chloride infusion  500 mL Intravenous Continuous Gatha Mayer, MD        Observations/Objective: Today's Vitals   06/03/20 0953 06/03/20 0954  BP: (!) 193/101   Pulse: 68   SpO2: 98%   Weight: 124 lb (56.2 kg)   Height: 5' 2"  (1.575 m)   PainSc:  5    Body mass index is 22.68 kg/m.  Awake, alert, oriented x3 Neck ; FROM, no nuchal rigidity Respiratory: not in acute distress MSK; Rheumatoid nodules in extremities  Assessment and Plan: 1. Other osteoporosis without current pathological fracture Stable - alendronate (FOSAMAX) 70 MG tablet; Take 1 tablet (70 mg total) by mouth every 7 (seven) days. Take with a full glass of water on an empty stomach.  Dispense: 4 tablet; Refill: 6  2. Essential hypertension Uncontrolled with high risk of progression to complications including but not limited to endorgan affectation due to not taking medication this morning Compliance has been emphasized This could also cause her headache Advised to take antihypertensive as soon as she gets home Counseled on blood pressure goal of less than 130/80, low-sodium,  DASH diet, medication compliance, 150 minutes of moderate intensity exercise per week. Discussed medication compliance, adverse effects. - CMP14+EGFR - Lipid panel - amLODipine (NORVASC) 10 MG tablet; Take 1 tablet (10 mg total) by mouth daily.  Dispense: 90 tablet; Refill: 1 - carvedilol (COREG) 6.25 MG tablet; TAKE 1 TABLET BY MOUTH 2 TIMES A DAY WITH MEALS  Dispense: 180 tablet; Refill: 1  3. Poor appetite - mirtazapine (REMERON) 30 MG tablet; Take 1 tablet (30 mg total) by mouth at bedtime.  Dispense: 90 tablet; Refill: 1  4. Rheumatoid arthritis involving multiple sites with positive rheumatoid factor (HCC) End stage Rheumatoid Keep appointment with Rheumatology - methotrexate (RHEUMATREX) 2.5 MG tablet; Take 3 tablets by mouth weekly. - folic acid (FOLVITE) 122 MCG tablet; Take 1 tablet (400 mcg total) by mouth daily.  Dispense: 90 tablet; Refill: 1  5. Closed nondisplaced fracture of pelvis with routine healing, unspecified part of  pelvis, subsequent encounter Ambulating with a walker Undergoing PT - Ambulatory referral to Orthopedic Surgery  6. Tension headache Uncontrolled BP could also be contributory - butalbital-acetaminophen-caffeine (FIORICET) 50-325-40 MG tablet; Take 1-2 tablets by mouth every 6 (six) hours as needed for headache.  Dispense: 30 tablet; Refill: 1  7. Urge incontinence of urine Discussed adverse effects of medications Discussed kegel exercises which she will be unable to perform due to gait instability and musculoskeletal conditions - oxybutynin (DITROPAN) 5 MG tablet; Take 1 tablet (5 mg total) by mouth 2 (two) times daily.  Dispense: 60 tablet; Refill: 3   Follow Up Instructions: 3 months   I discussed the assessment and treatment plan with the patient. The patient was provided an opportunity to ask questions and all were answered. The patient agreed with the plan and demonstrated an understanding of the instructions.   The patient was advised to  call back or seek an in-person evaluation if the symptoms worsen or if the condition fails to improve as anticipated.     I provided 23 minutes total of Telehealth time during this encounter including median intraservice time, reviewing previous notes, investigations, ordering medications, medical decision making, coordinating care and patient verbalized understanding at the end of the visit.     Charlott Rakes, MD, FAAFP. [facility] and [place], [place]   06/03/2020, 10:09 AM

## 2020-06-04 ENCOUNTER — Telehealth: Payer: Self-pay

## 2020-06-04 ENCOUNTER — Encounter: Payer: Self-pay | Admitting: Family Medicine

## 2020-06-04 ENCOUNTER — Other Ambulatory Visit: Payer: Self-pay | Admitting: Family Medicine

## 2020-06-04 LAB — LIPID PANEL
Chol/HDL Ratio: 2.6 ratio (ref 0.0–4.4)
Cholesterol, Total: 301 mg/dL — ABNORMAL HIGH (ref 100–199)
HDL: 116 mg/dL (ref >39)
LDL Chol Calc (NIH): 165 mg/dL — ABNORMAL HIGH (ref 0–99)
Triglycerides: 122 mg/dL (ref 0–149)
VLDL Cholesterol Cal: 20 mg/dL (ref 5–40)

## 2020-06-04 LAB — CMP14+EGFR
ALT: 38 IU/L — ABNORMAL HIGH (ref 0–32)
AST: 28 IU/L (ref 0–40)
Albumin/Globulin Ratio: 1.3 (ref 1.2–2.2)
Albumin: 4.4 g/dL (ref 3.7–4.7)
Alkaline Phosphatase: 367 IU/L — ABNORMAL HIGH (ref 44–121)
BUN/Creatinine Ratio: 18 (ref 12–28)
BUN: 16 mg/dL (ref 8–27)
Bilirubin Total: 0.4 mg/dL (ref 0.0–1.2)
CO2: 19 mmol/L — ABNORMAL LOW (ref 20–29)
Calcium: 10.2 mg/dL (ref 8.7–10.3)
Chloride: 105 mmol/L (ref 96–106)
Creatinine, Ser: 0.91 mg/dL (ref 0.57–1.00)
GFR calc Af Amer: 73 mL/min/{1.73_m2} (ref >59)
GFR calc non Af Amer: 63 mL/min/{1.73_m2} (ref >59)
Globulin, Total: 3.5 g/dL (ref 1.5–4.5)
Glucose: 80 mg/dL (ref 65–99)
Potassium: 4 mmol/L (ref 3.5–5.2)
Sodium: 143 mmol/L (ref 134–144)
Total Protein: 7.9 g/dL (ref 6.0–8.5)

## 2020-06-04 MED ORDER — ATORVASTATIN CALCIUM 40 MG PO TABS: 40.0000 mg | ORAL_TABLET | Freq: Every day | ORAL | 1 refills | Status: DC

## 2020-06-04 NOTE — Telephone Encounter (Signed)
-----   Message from Enobong Newlin, MD sent at 06/04/2020 10:53 AM EST ----- Cholesterol is elevated, I have increased her dose on Atorvastatin. Please advise to comply with a low cholesterol diet. One of her enzymes is elevated due to her chronic bone pain from Rheumatoid. Please encourage to continue with current medications 

## 2020-06-04 NOTE — Telephone Encounter (Signed)
Patient was called and a voicemail was left informing patient to return phone call for lab results. 

## 2020-06-06 ENCOUNTER — Telehealth: Payer: Self-pay

## 2020-06-06 NOTE — Telephone Encounter (Signed)
Patient was called and a voicemail was left informing patient to return phone call for lab results. 

## 2020-06-06 NOTE — Telephone Encounter (Signed)
-----   Message from Charlott Rakes, MD sent at 06/04/2020 10:53 AM EST ----- Cholesterol is elevated, I have increased her dose on Atorvastatin. Please advise to comply with a low cholesterol diet. One of her enzymes is elevated due to her chronic bone pain from Rheumatoid. Please encourage to continue with current medications

## 2020-06-07 ENCOUNTER — Other Ambulatory Visit: Payer: Self-pay | Admitting: Family Medicine

## 2020-06-07 ENCOUNTER — Ambulatory Visit: Payer: Medicare Other | Admitting: Orthopaedic Surgery

## 2020-06-07 DIAGNOSIS — G44209 Tension-type headache, unspecified, not intractable: Secondary | ICD-10-CM

## 2020-06-07 DIAGNOSIS — M419 Scoliosis, unspecified: Secondary | ICD-10-CM

## 2020-06-12 ENCOUNTER — Ambulatory Visit: Payer: Medicare Other | Admitting: Orthopaedic Surgery

## 2020-06-13 ENCOUNTER — Telehealth: Payer: Self-pay

## 2020-06-13 NOTE — Telephone Encounter (Signed)
Patient was called and a voicemail was left informing patient to return phone call for lab results. 

## 2020-06-13 NOTE — Telephone Encounter (Signed)
-----   Message from Enobong Newlin, MD sent at 06/04/2020 10:53 AM EST ----- Cholesterol is elevated, I have increased her dose on Atorvastatin. Please advise to comply with a low cholesterol diet. One of her enzymes is elevated due to her chronic bone pain from Rheumatoid. Please encourage to continue with current medications 

## 2020-06-17 ENCOUNTER — Telehealth: Payer: Self-pay | Admitting: Family Medicine

## 2020-06-17 NOTE — Telephone Encounter (Signed)
Copied from [place] [id number]. Topic: General - Inquiry >> Jun 17, 2020  9:16 AM Gillis Ends D wrote: Reason for CRM: Patient would like a call back in reference to her lab results. She can be reached at [phone]. Please advise

## 2020-06-17 NOTE — Telephone Encounter (Signed)
She is doing a good job by eating healthy.  Genetic component can also attribute to her elevated cholesterol levels.  Her levels have been persistently elevated but I had held off on increasing her dose in the event that her results might improve but given no improvement, an increase in dose needed to be made as the current dose is inadequate.

## 2020-06-17 NOTE — Telephone Encounter (Signed)
Pt's grandson and daughter was called and grandson states that he does not think medication is working at all, he is concerned with the increase of the medication, grandson states that pt had no food the day of labs, and states that she really only eats salmon and grilled chicken breast they only use salt free seasoning he is wanting to know why her levels are going up and she hardly eats.

## 2020-06-24 ENCOUNTER — Telehealth: Payer: Self-pay | Admitting: Family Medicine

## 2020-06-24 NOTE — Telephone Encounter (Signed)
Anderson Malta, from [facility] at home, calling stating that the pt is being discharged from [facility] PT today. Please advise.       [phone]

## 2020-11-11 ENCOUNTER — Other Ambulatory Visit: Payer: Self-pay | Admitting: Family Medicine

## 2020-11-11 DIAGNOSIS — J328 Other chronic sinusitis: Secondary | ICD-10-CM

## 2020-11-26 ENCOUNTER — Other Ambulatory Visit: Payer: Self-pay | Admitting: Family Medicine

## 2020-11-26 DIAGNOSIS — I10 Essential (primary) hypertension: Secondary | ICD-10-CM

## 2020-11-26 DIAGNOSIS — R630 Anorexia: Secondary | ICD-10-CM

## 2020-11-26 NOTE — Telephone Encounter (Signed)
Notes to clinic:  Review for refills Patient was due for 3 month follow up in April No appointment has been scheduled    Requested Prescriptions  Pending Prescriptions Disp Refills   mirtazapine (REMERON) 30 MG tablet [Pharmacy Med Name: Mirtazapine Oral Tablet 30 MG 30 MG  Tablet] 12 tablet     Sig: TAKE 1 TABLET BY MOUTH AT BEDTIME      Psychiatry: Antidepressants - mirtazapine Failed - 11/26/2020 12:46 PM      Failed - ALT in normal range and within 360 days    ALT  Date Value Ref Range Status  06/03/2020 38 (H) 0 - 32 IU/L Final          Failed - Total Cholesterol in normal range and within 360 days    Cholesterol, Total  Date Value Ref Range Status  06/03/2020 301 (H) 100 - 199 mg/dL Final          Passed - AST in normal range and within 360 days    AST  Date Value Ref Range Status  06/03/2020 28 0 - 40 IU/L Final          Passed - Triglycerides in normal range and within 360 days    Triglycerides  Date Value Ref Range Status  06/03/2020 122 0 - 149 mg/dL Final          Passed - WBC in normal range and within 360 days    WBC  Date Value Ref Range Status  12/05/2019 7.7 3.4 - 10.8 x10E3/uL Final  08/20/2017 7.7 3.8 - 10.8 Thousand/uL Final          Passed - Completed PHQ-2 or PHQ-9 in the last 360 days      Passed - Valid encounter within last 6 months    Recent Outpatient Visits           5 months ago Closed nondisplaced fracture of pelvis with routine healing, unspecified part of pelvis, subsequent encounter   [place], Charlane Ferretti, MD   11 months ago Heart beat abnormality   [place], Charlane Ferretti, MD   1 year ago Rheumatoid arthritis involving multiple sites with positive rheumatoid factor ([place])   [place], Enobong, MD   1 year ago Plantar callus   [place], Ashley, MD   2 years ago [place], [place]el[place], MD                  carvedilol (COREG) 6.25 MG tablet [Pharmacy Med Name: Carvedilol Oral Tablet 6.25 MG 6.25 MG  Tablet] 24 tablet     Sig: TAKE 1 TABLET BY MOUTH 2 TIMES A DAY WITH MEALS      Cardiovascular:  Beta Blockers Failed - 11/26/2020 12:46 PM      Failed - Last BP in normal range    BP Readings from Last 1 Encounters:  06/03/20 (!) 193/101          Passed - Last Heart Rate in normal range    Pulse Readings from Last 1 Encounters:  06/03/20 68          Passed - Valid encounter within last 6 months    Recent Outpatient Visits           5 months ago Closed nondisplaced fracture of pelvis with routine healing, unspecified part of pelvis, subsequent  encounter   [place], [place], MD   11 months ago Heart beat abnormality   [place], Charlane Ferretti, MD   1 year ago Rheumatoid arthritis involving multiple sites with positive rheumatoid factor ([place])   [place], Charlane Ferretti, MD   1 year ago Plantar callus   [place], Charlane Ferretti, MD   2 years ago [place], Charlane Ferretti, MD                  amLODipine (NORVASC) 10 MG tablet [Pharmacy Med Name: amLODIPine Besylate Oral Tablet 10 MG 10 MG  Tablet] 12 tablet     Sig: TAKE 1 TABLET BY MOUTH DAILY      Cardiovascular:  Calcium Channel Blockers Failed - 11/26/2020 12:46 PM      Failed - Last BP in normal range    BP Readings from Last 1 Encounters:  06/03/20 (!) 193/101          Passed - Valid encounter within last 6 months    Recent Outpatient Visits           5 months ago Closed nondisplaced fracture of pelvis with routine healing, unspecified part of pelvis, subsequent encounter   [place], Charlane Ferretti, MD   11 months ago  Heart beat abnormality   [place], Charlane Ferretti, MD   1 year ago Rheumatoid arthritis involving multiple sites with positive rheumatoid factor ([place])   [place], Charlane Ferretti, MD   1 year ago Plantar callus   [place], Charlane Ferretti, MD   2 years ago [place], [place], MD                  atorvastatin (LIPITOR) 40 MG tablet [Pharmacy Med Name: Atorvastatin Calcium Oral Tablet 40 MG 40 MG  Tablet] 12 tablet     Sig: TAKE 1 TABLET BY MOUTH DAILY      Cardiovascular:  Antilipid - Statins Failed - 11/26/2020 12:46 PM      Failed - Total Cholesterol in normal range and within 360 days    Cholesterol, Total  Date Value Ref Range Status  06/03/2020 301 (H) 100 - 199 mg/dL Final          Failed - LDL in normal range and within 360 days    LDL Chol Calc (NIH)  Date Value Ref Range Status  06/03/2020 165 (H) 0 - 99 mg/dL Final          Passed - HDL in normal range and within 360 days    HDL  Date Value Ref Range Status  06/03/2020 116 >39 mg/dL Final          Passed - Triglycerides in normal range and within 360 days    Triglycerides  Date Value Ref Range Status  06/03/2020 122 0 - 149 mg/dL Final          Passed - Patient is not pregnant      Passed - Valid encounter within last 12 months    Recent Outpatient Visits           5 months ago Closed nondisplaced fracture of pelvis with routine healing, unspecified part of pelvis, subsequent encounter   [place], Enobong, MD   11 months ago Heart beat abnormality   [place], Charlane Ferretti, MD   1 year ago Rheumatoid arthritis involving multiple sites with positive rheumatoid factor [facility])   [place], Enobong, MD   1 year ago Plantar callus   [place]  [facility] Charlott Rakes, MD   2 years ago [place]   [facility] Charlott Rakes, MD

## 2020-12-11 ENCOUNTER — Other Ambulatory Visit: Payer: Self-pay | Admitting: Family Medicine

## 2020-12-11 NOTE — Telephone Encounter (Signed)
Requested medication (s) are due for refill today: yes  Requested medication (s) are on the active medication list: yes  Last refill:  12/11/2020  Future visit scheduled: no  Notes to clinic:  vm left for patient to callback for appt Courtesy refill already given   Requested Prescriptions  Pending Prescriptions Disp Refills   atorvastatin (LIPITOR) 40 MG tablet [Pharmacy Med Name: Atorvastatin Calcium Oral Tablet 40 MG 40 MG  Tablet] 12 tablet     Sig: TAKE 1 TABLET BY MOUTH DAILY      Cardiovascular:  Antilipid - Statins Failed - 12/11/2020 11:09 AM      Failed - Total Cholesterol in normal range and within 360 days    Cholesterol, Total  Date Value Ref Range Status  06/03/2020 301 (H) 100 - 199 mg/dL Final          Failed - LDL in normal range and within 360 days    LDL Chol Calc (NIH)  Date Value Ref Range Status  06/03/2020 165 (H) 0 - 99 mg/dL Final          Passed - HDL in normal range and within 360 days    HDL  Date Value Ref Range Status  06/03/2020 116 >39 mg/dL Final          Passed - Triglycerides in normal range and within 360 days    Triglycerides  Date Value Ref Range Status  06/03/2020 122 0 - 149 mg/dL Final          Passed - Patient is not pregnant      Passed - Valid encounter within last 12 months    Recent Outpatient Visits           6 months ago Closed nondisplaced fracture of pelvis with routine healing, unspecified part of pelvis, subsequent encounter   [place], Enobong, MD   1 year ago Heart beat abnormality   [place], Charlane Ferretti, MD   1 year ago Rheumatoid arthritis involving multiple sites with positive rheumatoid factor ([place])   [place], Enobong, MD   1 year ago Plantar callus   [place], Enobong, MD   2 years ago [place],  Enobong, MD

## 2021-01-02 ENCOUNTER — Other Ambulatory Visit: Payer: Self-pay | Admitting: Family Medicine

## 2021-01-02 DIAGNOSIS — R630 Anorexia: Secondary | ICD-10-CM

## 2021-01-02 DIAGNOSIS — I10 Essential (primary) hypertension: Secondary | ICD-10-CM

## 2021-01-02 DIAGNOSIS — M818 Other osteoporosis without current pathological fracture: Secondary | ICD-10-CM

## 2021-01-02 NOTE — Telephone Encounter (Signed)
Attempted to call patient to schedule appointment-left message to call office for appointment. Patient overdue 6 month follow up. Rx with yearly RF- through December and courtesy I month RF for Rx needing 6 month follow up.

## 2021-02-20 ENCOUNTER — Other Ambulatory Visit: Payer: Self-pay | Admitting: Family Medicine

## 2021-02-20 DIAGNOSIS — R630 Anorexia: Secondary | ICD-10-CM

## 2021-02-20 DIAGNOSIS — I10 Essential (primary) hypertension: Secondary | ICD-10-CM

## 2021-02-20 DIAGNOSIS — J328 Other chronic sinusitis: Secondary | ICD-10-CM

## 2021-02-20 NOTE — Telephone Encounter (Signed)
Requested medication (s) are due for refill today Yes.   Requested medication (s) are on the active medication list Yes  Future visit scheduled No. LOV 06/03/20 was a hospital follow-up.   Flonase last ordered 11/11/20 with one refill Remeron last ordered 01/02/21 #30 no refill Coreg last ordered 01/02/21 #60 tabs no refills  Note to clinic-TC-Left VM to schedule OV for medication refills. Routing to provider for review.    Requested Prescriptions  Pending Prescriptions Disp Refills   fluticasone (FLONASE) 50 MCG/ACT nasal spray [Pharmacy Med Name: Fluticasone Propionate Nasal Suspension 50 MCG/ACT 50 MCG/ACT  Suspension] 16 mL 1    Sig: INSTILL 2 SPRAYS INTO EACH NOSTRIL DAILY     Ear, Nose, and Throat: Nasal Preparations - Corticosteroids Passed - 02/20/2021 10:22 AM      Passed - Valid encounter within last 12 months    Recent Outpatient Visits           8 months ago Closed nondisplaced fracture of pelvis with routine healing, unspecified part of pelvis, subsequent encounter   [place], Enobong, MD   1 year ago Heart beat abnormality   [place], Charlane Ferretti, MD   1 year ago Rheumatoid arthritis involving multiple sites with positive rheumatoid factor ([place])   [place], Enobong, MD   2 years ago Plantar callus   [place], Charlane Ferretti, MD   2 years ago [place], [place], MD               carvedilol (COREG) 6.25 MG tablet [Pharmacy Med Name: Carvedilol Oral Tablet 6.25 MG 6.25 MG  Tablet] 60 tablet 0    Sig: TAKE 1 TABLET BY MOUTH 2 TIMES A DAY WITH MEALS     Cardiovascular:  Beta Blockers Failed - 02/20/2021 10:22 AM      Failed - Last BP in normal range    BP Readings from Last 1 Encounters:  06/03/20 (!) 193/101          Failed - Valid encounter within last 6 months     Recent Outpatient Visits           8 months ago Closed nondisplaced fracture of pelvis with routine healing, unspecified part of pelvis, subsequent encounter   [place], Enobong, MD   1 year ago Heart beat abnormality   [place], Charlane Ferretti, MD   1 year ago Rheumatoid arthritis involving multiple sites with positive rheumatoid factor ([place])   [place], Enobong, MD   2 years ago Plantar callus   [place], Enobong, MD   2 years ago [place], [place], MD              Passed - Last Heart Rate in normal range    Pulse Readings from Last 1 Encounters:  06/03/20 68           mirtazapine (REMERON) 30 MG tablet [Pharmacy Med Name: Mirtazapine Oral Tablet 30 MG 30 MG  Tablet] 30 tablet 0    Sig: TAKE 1 TABLET (30 MG TOTAL) BY MOUTH AT BEDTIME.     Psychiatry: Antidepressants - mirtazapine Failed - 02/20/2021 10:22 AM      Failed - ALT in normal  range and within 360 days    ALT  Date Value Ref Range Status  06/03/2020 38 (H) 0 - 32 IU/L Final          Failed - Total Cholesterol in normal range and within 360 days    Cholesterol, Total  Date Value Ref Range Status  06/03/2020 301 (H) 100 - 199 mg/dL Final          Failed - WBC in normal range and within 360 days    WBC  Date Value Ref Range Status  12/05/2019 7.7 3.4 - 10.8 x10E3/uL Final  08/20/2017 7.7 3.8 - 10.8 Thousand/uL Final          Failed - Completed PHQ-2 or PHQ-9 in the last 360 days      Failed - Valid encounter within last 6 months    Recent Outpatient Visits           8 months ago Closed nondisplaced fracture of pelvis with routine healing, unspecified part of pelvis, subsequent encounter   [place], Enobong, MD   1 year ago Heart beat abnormality   [place], Charlane Ferretti, MD   1 year ago Rheumatoid arthritis involving multiple sites with positive rheumatoid factor [facility])   [place], Charlane Ferretti, MD   2 years ago Plantar callus   [place], [place], MD   2 years ago [place], Charlane Ferretti, MD              Passed - AST in normal range and within 360 days    AST  Date Value Ref Range Status  06/03/2020 28 0 - 40 IU/L Final          Passed - Triglycerides in normal range and within 360 days    Triglycerides  Date Value Ref Range Status  06/03/2020 122 0 - 149 mg/dL Final

## 2021-04-01 ENCOUNTER — Other Ambulatory Visit: Payer: Self-pay | Admitting: Family Medicine

## 2021-04-01 DIAGNOSIS — J328 Other chronic sinusitis: Secondary | ICD-10-CM

## 2021-04-01 DIAGNOSIS — I10 Essential (primary) hypertension: Secondary | ICD-10-CM

## 2021-04-02 NOTE — Telephone Encounter (Signed)
Requested medications are due for refill today.  yes  Requested medications are on the active medications list.  yes  Last refill. 06/03/2020  Future visit scheduled.   no  Notes to clinic.  Pt last seen 06/03/2020 - more than 3 months over due for OV

## 2021-04-02 NOTE — Telephone Encounter (Signed)
Requested Prescriptions  Pending Prescriptions Disp Refills  . amLODipine (NORVASC) 10 MG tablet [Pharmacy Med Name: amLODIPine Besylate Oral Tablet 10 MG 10 MG  Tablet] 28 tablet     Sig: TAKE 1 TABLET BY MOUTH DAILY     Cardiovascular:  Calcium Channel Blockers Failed - 04/01/2021  4:47 PM      Failed - Last BP in normal range    BP Readings from Last 1 Encounters:  06/03/20 (!) 193/101         Failed - Valid encounter within last 6 months    Recent Outpatient Visits          10 months ago Closed nondisplaced fracture of pelvis with routine healing, unspecified part of pelvis, subsequent encounter   [place], En[place]ong, MD   1 year ago Heart beat abnormality   [place], Charlane Ferretti, MD   1 year ago Rheumatoid arthritis involving multiple sites with positive rheumatoid factor ([place])   [place], Enobong, MD   2 years ago Plantar callus   [place], Enobong, MD   2 years ago [place], [place], MD             . fluticasone [facility]) 50 MCG/ACT nasal spray [Pharmacy Med Name: Fluticasone Propionate Nasal Suspension 50 MCG/ACT 50 MCG/ACT  Suspension] 16 mL 1    Sig: INSTILL 2 SPRAYS INTO EACH NOSTRIL DAILY     Ear, Nose, and Throat: Nasal Preparations - Corticosteroids Passed - 04/01/2021  4:47 PM      Passed - Valid encounter within last 12 months    Recent Outpatient Visits          10 months ago Closed nondisplaced fracture of pelvis with routine healing, unspecified part of pelvis, subsequent encounter   [place], Enobong, MD   1 year ago Heart beat abnormality   [place], Charlane Ferretti, MD   1 year ago Rheumatoid arthritis involving multiple sites with positive rheumatoid factor [facility])   [place], Enobong, MD   2 years ago Plantar callus   [place], Enobong, MD   2 years ago Impetigo    [facility] Charlott Rakes, MD

## 2021-04-08 ENCOUNTER — Other Ambulatory Visit: Payer: Self-pay | Admitting: Family Medicine

## 2021-04-08 DIAGNOSIS — I10 Essential (primary) hypertension: Secondary | ICD-10-CM

## 2021-04-08 NOTE — Telephone Encounter (Signed)
Requested medication (s) are due for refill today - yes  Requested medication (s) are on the active medication list -yes  Future visit scheduled -yes  Last refill: 02/20/21  Notes to clinic: Request RF: Rx denied for needs appointment- patient has scheduled appointment-06/17/21. Request sent for review   Requested Prescriptions  Pending Prescriptions Disp Refills   amLODipine (NORVASC) 10 MG tablet [Pharmacy Med Name: amLODIPine Besylate Oral Tablet 10 MG 10 MG  Tablet] 30 tablet     Sig: TAKE 1 TABLET (10MG  TOTAL) BY MOUTH EVERY DAY     Cardiovascular:  Calcium Channel Blockers Failed - 04/08/2021 10:09 AM      Failed - Last BP in normal range    BP Readings from Last 1 Encounters:  06/03/20 (!) 193/101          Failed - Valid encounter within last 6 months    Recent Outpatient Visits           10 months ago Closed nondisplaced fracture of pelvis with routine healing, unspecified part of pelvis, subsequent encounter   [place], Enobong, MD   1 year ago Heart beat abnormality   [place], Charlane Ferretti, MD   1 year ago Rheumatoid arthritis involving multiple sites with positive rheumatoid factor ([place])   [place], Enobong, MD   2 years ago Plantar callus   [place], MD   2 years ago [place], Enobong, MD       Future Appointments             In 2 months Charlott Rakes, MD [place]               Requested Prescriptions  Pending Prescriptions Disp Refills   amLODipine (NORVASC) 10 MG tablet [Pharmacy Med Name: amLODIPine Besylate Oral Tablet 10 MG 10 MG  Tablet] 30 tablet     Sig: TAKE 1 TABLET (10MG  TOTAL) BY MOUTH EVERY DAY     Cardiovascular:  Calcium Channel Blockers Failed - 04/08/2021 10:09 AM      Failed - Last  BP in normal range    BP Readings from Last 1 Encounters:  06/03/20 (!) 193/101          Failed - Valid encounter within last 6 months    Recent Outpatient Visits           10 months ago Closed nondisplaced fracture of pelvis with routine healing, unspecified part of pelvis, subsequent encounter   [place], Enobong, MD   1 year ago Heart beat abnormality   [place], Charlane Ferretti, MD   1 year ago Rheumatoid arthritis involving multiple sites with positive rheumatoid factor [facility])   [place], Enobong, MD   2 years ago Plantar callus   [place], MD   2 years ago [place], Enobong, MD       Future Appointments             In 2 months Charlott Rakes, MD [place]

## 2021-04-16 ENCOUNTER — Other Ambulatory Visit: Payer: Self-pay | Admitting: Family Medicine

## 2021-04-16 DIAGNOSIS — I10 Essential (primary) hypertension: Secondary | ICD-10-CM

## 2021-04-16 DIAGNOSIS — R630 Anorexia: Secondary | ICD-10-CM

## 2021-04-16 NOTE — Telephone Encounter (Signed)
Future OV 06/17/20. Patient is >3 months overdue-providing courtesy refill to last until scheduled appointment per protocol. Noted on appointment date patient is due for PHQ-9 Requested Prescriptions  Pending Prescriptions Disp Refills  . mirtazapine (REMERON) 30 MG tablet [Pharmacy Med Name: Mirtazapine Oral Tablet 30 MG 30 MG  Tablet] 60 tablet 0    Sig: TAKE 1 TABLET BY MOUTH AT BEDTIME     Psychiatry: Antidepressants - mirtazapine Failed - 04/16/2021  8:50 AM      Failed - ALT in normal range and within 360 days    ALT  Date Value Ref Range Status  06/03/2020 38 (H) 0 - 32 IU/L Final         Failed - Total Cholesterol in normal range and within 360 days    Cholesterol, Total  Date Value Ref Range Status  06/03/2020 301 (H) 100 - 199 mg/dL Final         Failed - WBC in normal range and within 360 days    WBC  Date Value Ref Range Status  12/05/2019 7.7 3.4 - 10.8 x10E3/uL Final  08/20/2017 7.7 3.8 - 10.8 Thousand/uL Final         Failed - Completed PHQ-2 or PHQ-9 in the last 360 days      Failed - Valid encounter within last 6 months    Recent Outpatient Visits          10 months ago Closed nondisplaced fracture of pelvis with routine healing, unspecified part of pelvis, subsequent encounter   [place], Charlane Ferretti, MD   1 year ago Heart beat abnormality   [place], Charlane Ferretti, MD   1 year ago Rheumatoid arthritis involving multiple sites with positive rheumatoid factor ([place])   [place], Charlane Ferretti, MD   2 years ago Plantar callus   [place], Charlane Ferretti, MD   2 years ago [place], Charlane Ferretti, MD      Future Appointments            In 2 months Charlott Rakes, MD [place] - AST in normal range and within 360 days    AST   Date Value Ref Range Status  06/03/2020 28 0 - 40 IU/L Final         Passed - Triglycerides in normal range and within 360 days    Triglycerides  Date Value Ref Range Status  06/03/2020 122 0 - 149 mg/dL Final         . carvedilol (COREG) 6.25 MG tablet [Pharmacy Med Name: Carvedilol Oral Tablet 6.25 MG 6.25 MG  Tablet] 24 tablet     Sig: TAKE 1 TABLET BY MOUTH 2 TIMES A DAY WITH MEALS     Cardiovascular:  Beta Blockers Failed - 04/16/2021  8:50 AM      Failed - Last BP in normal range    BP Readings from Last 1 Encounters:  06/03/20 (!) 193/101         Failed - Valid encounter within last 6 months    Recent Outpatient Visits          10 months ago Closed nondisplaced fracture of pelvis with routine healing, unspecified part of pelvis, subsequent encounter   [facility] Charlott Rakes, MD  1 year ago Heart beat abnormality   [place], [place], MD   1 year ago Rheumatoid arthritis involving multiple sites with positive rheumatoid factor [facility])   [place], Enobong, MD   2 years ago Plantar callus   [place], MD   2 years ago [place], Enobong, MD      Future Appointments            In 2 months Charlott Rakes, MD [place] - Last Heart Rate in normal range    Pulse Readings from Last 1 Encounters:  06/03/20 68

## 2021-05-01 ENCOUNTER — Other Ambulatory Visit: Payer: Self-pay | Admitting: Family Medicine

## 2021-05-01 DIAGNOSIS — N3941 Urge incontinence: Secondary | ICD-10-CM

## 2021-05-01 DIAGNOSIS — I10 Essential (primary) hypertension: Secondary | ICD-10-CM

## 2021-05-01 MED ORDER — AMLODIPINE BESYLATE 10 MG PO TABS: ORAL_TABLET | ORAL | 0 refills | Status: DC

## 2021-05-01 MED ORDER — CARVEDILOL 6.25 MG PO TABS: ORAL_TABLET | ORAL | 0 refills | Status: DC

## 2021-05-01 MED ORDER — OXYBUTYNIN CHLORIDE 5 MG PO TABS: 5.0000 mg | ORAL_TABLET | Freq: Two times a day (BID) | ORAL | 5 refills | Status: DC

## 2021-05-01 NOTE — Telephone Encounter (Signed)
Cassandra from [facility] , called in for clarification on med , carvedilol (COREG) 6.25 MG tablet 6.25 mg , has 48 quantitiy but directions are to tke 2 a day. Please call back

## 2021-05-01 NOTE — Addendum Note (Signed)
Addended by: Matilde Sprang on: 05/01/2021 06:04 PM   Modules accepted: Orders

## 2021-05-01 NOTE — Telephone Encounter (Signed)
Courtesy refill given to last until upcoming appointment 06/17/21.  Requested Prescriptions  Pending Prescriptions Disp Refills  . oxybutynin (DITROPAN) 5 MG tablet 90 tablet 0    Sig: Take 1 tablet (5 mg total) by mouth 2 (two) times daily.     Urology:  Bladder Agents Passed - 05/01/2021  5:50 PM      Passed - Valid encounter within last 12 months    Recent Outpatient Visits          11 months ago Closed nondisplaced fracture of pelvis with routine healing, unspecified part of pelvis, subsequent encounter   [place], Enobong, MD   1 year ago Heart beat abnormality   [place], Charlane Ferretti, MD   1 year ago Rheumatoid arthritis involving multiple sites with positive rheumatoid factor [facility])   [place], Enobong, MD   2 years ago Plantar callus   [place], MD   2 years ago [place], MD      Future Appointments            In 1 month Charlott Rakes, MD [place]           . amLODipine (NORVASC) 10 MG tablet 45 tablet 0    Sig: TAKE 1 TABLET (10MG  TOTAL) BY MOUTH EVERY DAY Strength: 10 mg     Cardiovascular:  Calcium Channel Blockers Failed - 05/01/2021  5:50 PM      Failed - Last BP in normal range    BP Readings from Last 1 Encounters:  06/03/20 (!) 193/101         Failed - Valid encounter within last 6 months    Recent Outpatient Visits          11 months ago Closed nondisplaced fracture of pelvis with routine healing, unspecified part of pelvis, subsequent encounter   [place], Enobong, MD   1 year ago Heart beat abnormality   [place], Charlane Ferretti, MD   1 year ago Rheumatoid arthritis involving multiple sites with positive rheumatoid factor  [facility])   [place], Enobong, MD   2 years ago Plantar callus   [place], MD   2 years ago [place], MD      Future Appointments            In 1 month Charlott Rakes, MD [place]

## 2021-05-01 NOTE — Telephone Encounter (Signed)
[place] called and spoke to Irene, [facility] about the Carvedilol. She says the amount 48 will only last 24 days. I advised she was supposed to receive enough refill to last until appointment 06/17/21, so I will resend the refill with the corrected amount.

## 2021-05-01 NOTE — Telephone Encounter (Signed)
Medication Refill - Medication:amLODipine (NORVASC) 10 MG tablet   Has the patient contacted their pharmacy? yes (Agent: If no, request that the patient contact the pharmacy for the refill. If patient does not wish to contact the pharmacy document the reason why and proceed with request.) (Agent: If yes, when and what did the pharmacy advise?)contact pcp/pharm called in  Preferred Pharmacy (with phone number or street name):[place], [place]  Phone:  [phone] Fax:  [phone] Has the patient been seen for an appointment in the last year OR does the patient have an upcoming appointment? yes  Agent: Please be advised that RX refills may take up to 3 business days. We ask that you follow-up with your pharmacy.

## 2021-05-01 NOTE — Telephone Encounter (Signed)
Patient also needs, oxybutynin (DITROPAN) 5 MG tablet  filled

## 2021-05-01 NOTE — Addendum Note (Signed)
Addended by: Matilde Sprang on: 05/01/2021 06:07 PM   Modules accepted: Orders

## 2021-05-20 ENCOUNTER — Telehealth: Payer: Self-pay | Admitting: Family Medicine

## 2021-05-20 NOTE — Telephone Encounter (Signed)
Home Health Verbal Orders - Caller/Agency:patients grandson Callback [phone] Requesting Skilled Nursing/home health with bathing Frequency: every day

## 2021-05-20 NOTE — Telephone Encounter (Signed)
PCS form will be started and pt will be called to set up virtual visit for office notes.

## 2021-05-21 ENCOUNTER — Telehealth: Payer: Self-pay | Admitting: Family Medicine

## 2021-05-21 NOTE — Telephone Encounter (Signed)
Copied from [place] [id number]. Topic: General - Other >> May 20, 2021  2:15 PM [name], [place] wrote: Reason for JTT:[id number] called in to spea with [name] about medications patient should currently be on.

## 2021-05-21 NOTE — Telephone Encounter (Signed)
Pt's grandson was called and informed that he can come pick up a current medication list.

## 2021-06-06 ENCOUNTER — Other Ambulatory Visit: Payer: Self-pay | Admitting: Family Medicine

## 2021-06-06 NOTE — Telephone Encounter (Signed)
Requested medication (s) are due for refill today:   Yes  Requested medication (s) are on the active medication list:   Yes  Future visit scheduled:   Yes 06/17/2021 with [place]   Last ordered: 01/02/2021 #30, 3 refills  Returned because failed protocol.   Labs are overdue.      Requested Prescriptions  Pending Prescriptions Disp Refills   atorvastatin (LIPITOR) 40 MG tablet [Pharmacy Med Name: Atorvastatin Calcium Oral Tablet 40 MG 40 MG  Tablet] 30 tablet 3    Sig: TAKE 1 TABLET BY MOUTH DAILY     Cardiovascular:  Antilipid - Statins Failed - 06/06/2021 12:31 PM      Failed - Total Cholesterol in normal range and within 360 days    Cholesterol, Total  Date Value Ref Range Status  06/03/2020 301 (H) 100 - 199 mg/dL Final          Failed - LDL in normal range and within 360 days    LDL Chol Calc (NIH)  Date Value Ref Range Status  06/03/2020 165 (H) 0 - 99 mg/dL Final          Failed - HDL in normal range and within 360 days    HDL  Date Value Ref Range Status  06/03/2020 116 >39 mg/dL Final          Failed - Triglycerides in normal range and within 360 days    Triglycerides  Date Value Ref Range Status  06/03/2020 122 0 - 149 mg/dL Final          Failed - Valid encounter within last 12 months    Recent Outpatient Visits           1 year ago Closed nondisplaced fracture of pelvis with routine healing, unspecified part of pelvis, subsequent encounter   [place], Enobong, MD   1 year ago Heart beat abnormality   [place], Charlane Ferretti, MD   1 year ago Rheumatoid arthritis involving multiple sites with positive rheumatoid factor ([place])   [place], Enobong, MD   2 years ago Plantar callus   [place], Enobong, MD   2 years ago [place], Enobong, MD       Future  Appointments             In 1 week Charlott Rakes, MD [place]   In 2 months Charlott Rakes, MD [place] - Patient is not pregnant

## 2021-06-09 ENCOUNTER — Telehealth: Payer: Medicare Other | Admitting: Nurse Practitioner

## 2021-06-10 ENCOUNTER — Other Ambulatory Visit: Payer: Self-pay | Admitting: Family Medicine

## 2021-06-10 DIAGNOSIS — I10 Essential (primary) hypertension: Secondary | ICD-10-CM

## 2021-06-10 NOTE — Telephone Encounter (Signed)
Requested medication (s) are due for refill today: Yes  Requested medication (s) are on the active medication list: Yes  Last refill:  05/01/21  Future visit scheduled: Yes  Notes to clinic:  Unable to refill per protocol, courtesy refill already given, routing for provider approval.     Requested Prescriptions  Pending Prescriptions Disp Refills   amLODipine (NORVASC) 10 MG tablet [Pharmacy Med Name: amLODIPine Besylate Oral Tablet 10 MG 10 MG  Tablet] 15 tablet     Sig: TAKE 1 TABLET BY MOUTH DAILY - OFFICE VISIT NEEDED FOR ADDITIONAL REFILLS -     Cardiovascular:  Calcium Channel Blockers Failed - 06/10/2021  2:30 PM      Failed - Last BP in normal range    BP Readings from Last 1 Encounters:  06/03/20 (!) 193/101          Failed - Valid encounter within last 6 months    Recent Outpatient Visits           1 year ago Closed nondisplaced fracture of pelvis with routine healing, unspecified part of pelvis, subsequent encounter   [place], Charlane Ferretti, MD   1 year ago Heart beat abnormality   [place], Charlane Ferretti, MD   1 year ago Rheumatoid arthritis involving multiple sites with positive rheumatoid factor ([place])   [place], Enobong, MD   2 years ago Plantar callus   [place], MD   2 years ago [place], Enobong, MD       Future Appointments             In 1 week Charlott Rakes, MD [place]   In 2 months Charlott Rakes, MD [place]             carvedilol (COREG) 6.25 MG tablet [Pharmacy Med Name: Carvedilol Oral Tablet 6.25 MG 6.25 MG  Tablet] 30 tablet     Sig: TAKE 1 TABLET BY MOUTH TWICE DAILY WITH MEALS - OFFICE VISIT NEEDED FOR ADDITIONAL REFILLS-     Cardiovascular:  Beta Blockers  Failed - 06/10/2021  2:30 PM      Failed - Last BP in normal range    BP Readings from Last 1 Encounters:  06/03/20 (!) 193/101          Failed - Valid encounter within last 6 months    Recent Outpatient Visits           1 year ago Closed nondisplaced fracture of pelvis with routine healing, unspecified part of pelvis, subsequent encounter   [place], Enobong, MD   1 year ago Heart beat abnormality   [place], Charlane Ferretti, MD   1 year ago Rheumatoid arthritis involving multiple sites with positive rheumatoid factor ([place])   [place], Enobong, MD   2 years ago Plantar callus   [place], MD   2 years ago [place], Enobong, MD       Future Appointments             In 1 week Charlott Rakes, MD [place]   In 2 months Charlott Rakes, MD  [place] - Last Heart Rate in normal range    Pulse Readings from Last 1 Encounters:  06/03/20 68

## 2021-06-17 ENCOUNTER — Ambulatory Visit: Payer: Medicare Other | Attending: Family Medicine | Admitting: Family Medicine

## 2021-06-17 ENCOUNTER — Encounter: Payer: Self-pay | Admitting: Family Medicine

## 2021-06-17 ENCOUNTER — Telehealth: Payer: Self-pay | Admitting: Family Medicine

## 2021-06-17 DIAGNOSIS — I10 Essential (primary) hypertension: Secondary | ICD-10-CM | POA: Diagnosis not present

## 2021-06-17 DIAGNOSIS — N3941 Urge incontinence: Secondary | ICD-10-CM

## 2021-06-17 DIAGNOSIS — M818 Other osteoporosis without current pathological fracture: Secondary | ICD-10-CM

## 2021-06-17 DIAGNOSIS — Z9189 Other specified personal risk factors, not elsewhere classified: Secondary | ICD-10-CM

## 2021-06-17 DIAGNOSIS — M0579 Rheumatoid arthritis with rheumatoid factor of multiple sites without organ or systems involvement: Secondary | ICD-10-CM

## 2021-06-17 DIAGNOSIS — M7502 Adhesive capsulitis of left shoulder: Secondary | ICD-10-CM

## 2021-06-17 DIAGNOSIS — R630 Anorexia: Secondary | ICD-10-CM

## 2021-06-17 MED ORDER — AMLODIPINE BESYLATE 10 MG PO TABS: ORAL_TABLET | ORAL | 1 refills | Status: DC

## 2021-06-17 MED ORDER — ATORVASTATIN CALCIUM 40 MG PO TABS: 40.0000 mg | ORAL_TABLET | Freq: Every day | ORAL | 1 refills | Status: DC

## 2021-06-17 MED ORDER — PREDNISONE 20 MG PO TABS: 20.0000 mg | ORAL_TABLET | Freq: Every day | ORAL | 0 refills | Status: DC

## 2021-06-17 MED ORDER — ALENDRONATE SODIUM 70 MG PO TABS: ORAL_TABLET | ORAL | 1 refills | Status: DC

## 2021-06-17 MED ORDER — TRAMADOL HCL 50 MG PO TABS: 50.0000 mg | ORAL_TABLET | Freq: Every evening | ORAL | 1 refills | Status: DC | PRN

## 2021-06-17 MED ORDER — MIRTAZAPINE 30 MG PO TABS: 30.0000 mg | ORAL_TABLET | Freq: Every day | ORAL | 1 refills | Status: DC

## 2021-06-17 MED ORDER — CARVEDILOL 6.25 MG PO TABS: ORAL_TABLET | ORAL | 1 refills | Status: DC

## 2021-06-17 MED ORDER — OXYBUTYNIN CHLORIDE 5 MG PO TABS: 5.0000 mg | ORAL_TABLET | Freq: Two times a day (BID) | ORAL | 1 refills | Status: DC

## 2021-06-17 NOTE — Progress Notes (Signed)
Virtual Visit via Video Note  I connected with Kristy Oliver, on 06/17/2021 at 3:32 PM by video enabled telemedicine device due to the COVID-19 pandemic and verified that I am speaking with the correct person using two identifiers.   Consent: I discussed the limitations, risks, security and privacy concerns of performing an evaluation and management service by telemedicine and the availability of in person appointments. I also discussed with the patient that there may be a patient responsible charge related to this service. The patient expressed understanding and agreed to proceed.   Location of Patient: Home  Location of Provider: Home Office   Persons participating in Telemedicine visit: Kristy Oliver -family member Dr. Margarita Rana     History of Present Illness: Kristy Oliver is a 76 y.o. year old female  with a past medical history of hypertensiton and rheumatoid arthritis, osteoporosis, left hip, left humeral fracture status post nailing of left hip in 03/2018, pelvic fracture in 03/2020 here for chronic disease management    She complains of her body aching all  the time. Her L shoulder has a reduced ROM, her lower spine hurt. She is currently not taking any of her rheumatoid medications and she has not seen her Rheumatologist in a while.  Previously followed by a rheumatologist in [facility] with [facility] on her last visit was close to 2 years ago.  Rheumatology notes reviewed revealed patient has end-stage rheumatoid arthritis.  She would like referral for Home health and a dental referral.  She has only 1 tooth left and would like evaluation for dentures.  She has not been checking her blood pressures at home but endorses compliance with her antihypertensives.  Past Medical History:  Diagnosis Date   Allergy    Arthritis    Asthma    as young person, none currently   Facial cellulitis 11/21/5282   Gout    Helicobacter pylori gastritis 12/13/2016   HTN  (hypertension), malignant 11/17/2012   Hx of adenomatous colonic polyps 12/29/2016   Hyperlipidemia    medicine currently   Hypertension    Osteoporosis    Renal disorder    Tuberculosis    pt took meds for 9 months - finish meds last week    Allergies  Allergen Reactions   Clindamycin Hives, Rash and Swelling   Metrizamide     unknown   Motrin [Ibuprofen] Other (See Comments)    blackout States she is only allergic to 800 mg formulation   Iodinated Contrast Media Rash    unknown unknown   Milk-Related Compounds Diarrhea   Shrimp [Shellfish Allergy] Hives, Swelling and Rash    Current Outpatient Medications on File Prior to Visit  Medication Sig Dispense Refill   alendronate (FOSAMAX) 70 MG tablet TAKE 1 TABLET BY MOUTH EVERY 7 DAYS, TAKE WITH A FULL GLASS OF WATER ON AN EMPTY STOMACH 4 tablet 6   amLODipine (NORVASC) 10 MG tablet TAKE 1 TABLET BY MOUTH DAILY - OFFICE VISIT NEEDED FOR ADDITIONAL REFILLS - 30 tablet 0   atorvastatin (LIPITOR) 40 MG tablet TAKE 1 TABLET BY MOUTH DAILY 30 tablet 0   carvedilol (COREG) 6.25 MG tablet TAKE 1 TABLET BY MOUTH TWICE DAILY WITH MEALS - OFFICE VISIT NEEDED FOR ADDITIONAL REFILLS- 30 tablet 0   cetirizine (ZYRTEC) 10 MG tablet TAKE 1 TABLET BY MOUTH ONCE DAILY 28 tablet 3   diclofenac Sodium (VOLTAREN) 1 % GEL APPLY 4 GRAMS TOPICALLY 4 (FOUR) TIMES DAILY. 100 g 1   doxycycline (  VIBRA-TABS) 100 MG tablet Take 1 tablet (100 mg total) by mouth 2 (two) times daily. (Patient not taking: Reported on 06/29/2019) 20 tablet 0   fluticasone (FLONASE) 50 MCG/ACT nasal spray INSTILL 2 SPRAYS INTO EACH NOSTRIL DAILY 16 mL 1   folic acid (FOLVITE) 950 MCG tablet Take 1 tablet (400 mcg total) by mouth daily. 90 tablet 1   leflunomide (ARAVA) 10 MG tablet Take 10 mg by mouth daily. (Patient not taking: Reported on 06/03/2020)     methotrexate (RHEUMATREX) 2.5 MG tablet Take 3 tablets by mouth weekly.     mirtazapine (REMERON) 30 MG tablet TAKE 1 TABLET BY MOUTH  AT BEDTIME 60 tablet 0   mupirocin ointment (BACTROBAN) 2 % Apply 1 application topically 2 (two) times daily. (Patient not taking: No sig reported) 30 g 2   oxybutynin (DITROPAN) 5 MG tablet Take 1 tablet (5 mg total) by mouth 2 (two) times daily. 60 tablet 5   tiZANidine (ZANAFLEX) 4 MG tablet TAKE 1 TABLET BY MOUTH EVERY 12 HOURS AS NEEDED FOR MUSCLE SPASM 60 tablet 2   traMADol (ULTRAM) 50 MG tablet Take 50 mg by mouth every 8 (eight) hours as needed.     triamcinolone cream (KENALOG) 0.1 % APPLY 1 APPLICATION TOPICALLY 2 TIMES DAILY 80 g 0   Current Facility-Administered Medications on File Prior to Visit  Medication Dose Route Frequency Provider Last Rate Last Admin   0.9 %  sodium chloride infusion  500 mL Intravenous Continuous Gatha Mayer, MD        ROS: See HPI  Observations/Objective: General-alert, awake, oriented x3 Neck-no JVD Musculoskeletal-rheumatoid nodules in joints of hand. Active abduction of left shoulder limited to 60 degrees Skin-no rash Psych-normal  CMP Latest Ref Rng & Units 06/03/2020 12/05/2019 07/12/2019  Glucose 65 - 99 mg/dL 80 99 114(H)  BUN 8 - 27 mg/dL 16 11 7(L)  Creatinine 0.57 - 1.00 mg/dL 0.91 0.65 0.80  Sodium 134 - 144 mmol/L 143 139 141  Potassium 3.5 - 5.2 mmol/L 4.0 3.8 3.9  Chloride 96 - 106 mmol/L 105 103 105  CO2 20 - 29 mmol/L 19(L) 20 22  Calcium 8.7 - 10.3 mg/dL 10.2 9.8 9.7  Total Protein 6.0 - 8.5 g/dL 7.9 8.4 -  Total Bilirubin 0.0 - 1.2 mg/dL 0.4 0.3 -  Alkaline Phos 44 - 121 IU/L 367(H) 303(H) -  AST 0 - 40 IU/L 28 70(H) -  ALT 0 - 32 IU/L 38(H) 32 -    Lipid Panel     Component Value Date/Time   CHOL 301 (H) 06/03/2020 1043   TRIG 122 06/03/2020 1043   HDL 116 06/03/2020 1043   CHOLHDL 2.6 06/03/2020 1043   CHOLHDL 2.6 07/02/2015 0915   VLDL 20 07/02/2015 0915   LDLCALC 165 (H) 06/03/2020 1043   LABVLDL 20 06/03/2020 1043    Lab Results  Component Value Date   HGBA1C 5.5 11/02/2016     Assessment and  Plan: 1. Essential hypertension She will need an office visit to evaluate her blood pressure Continue current regimen - amLODipine (NORVASC) 10 MG tablet; TAKE 1 TABLET BY MOUTH DAILY  Dispense: 90 tablet; Refill: 1 - carvedilol (COREG) 6.25 MG tablet; TAKE 1 TABLET BY MOUTH TWICE DAILY WITH MEALS  Dispense: 180 tablet; Refill: 1  2. Poor appetite Doing well on Remeron - mirtazapine (REMERON) 30 MG tablet; Take 1 tablet (30 mg total) by mouth at bedtime.  Dispense: 90 tablet; Refill: 1  3. Urge incontinence  of urine Stable - oxybutynin (DITROPAN) 5 MG tablet; Take 1 tablet (5 mg total) by mouth 2 (two) times daily.  Dispense: 180 tablet; Refill: 1  4. Other osteoporosis without current pathological fracture Stable - alendronate (FOSAMAX) 70 MG tablet; TAKE 1 TABLET BY MOUTH EVERY 7 DAYS, TAKE WITH A FULL GLASS OF WATER ON AN EMPTY STOMACH  Dispense: 12 tablet; Refill: 1  5. Adhesive capsulitis of left shoulder Uncontrolled Likely due to combination of underlying osteoarthritis coupled with rheumatoid arthritis Will place on short course of prednisone Referred for PT If symptoms persist consider referral to orthopedic for cortisone injection - predniSONE (DELTASONE) 20 MG tablet; Take 1 tablet (20 mg total) by mouth daily with breakfast.  Dispense: 5 tablet; Refill: 0 - Ambulatory referral to Physical Therapy - traMADol (ULTRAM) 50 MG tablet; Take 1 tablet (50 mg total) by mouth at bedtime as needed.  Dispense: 30 tablet; Refill: 1  6. Poor oral hygiene - Ambulatory referral to Dentistry  7. Rheumatoid arthritis involving multiple sites with positive rheumatoid factor (HCC) End-stage rheumatoid arthritis per rheumatologist Currently not on  DMARDS She has been lost to rheumatology follow-up I have provided her with the number to a rheumatologist to set up an appointment   Follow Up Instructions: Will see her shortly to follow-up on her symptoms   I discussed the assessment  and treatment plan with the patient. The patient was provided an opportunity to ask questions and all were answered. The patient agreed with the plan and demonstrated an understanding of the instructions.   The patient was advised to call back or seek an in-person evaluation if the symptoms worsen or if the condition fails to improve as anticipated.     I provided 20 minutes total of Telehealth time during this encounter including median intraservice time, reviewing previous notes, investigations, ordering medications, medical decision making, coordinating care and patient verbalized understanding at the end of the visit.     Charlott Rakes, MD, FAAFP. [facility] [place] [place], [place]   06/17/2021, 3:32 PM

## 2021-06-17 NOTE — Telephone Encounter (Signed)
Please complete the form for PCS services.  Thank you

## 2021-06-18 ENCOUNTER — Encounter: Payer: Self-pay | Admitting: Family Medicine

## 2021-06-23 NOTE — Telephone Encounter (Signed)
PCS form has been completed.

## 2021-07-15 ENCOUNTER — Other Ambulatory Visit: Payer: Self-pay

## 2021-07-15 ENCOUNTER — Telehealth: Payer: Self-pay

## 2021-07-15 ENCOUNTER — Encounter: Payer: Self-pay | Admitting: Family Medicine

## 2021-07-15 ENCOUNTER — Ambulatory Visit: Payer: Medicare Other | Attending: Family Medicine | Admitting: Family Medicine

## 2021-07-15 VITALS — BP 134/78 | HR 80 | Ht 60.0 in | Wt 127.0 lb

## 2021-07-15 DIAGNOSIS — R748 Abnormal levels of other serum enzymes: Secondary | ICD-10-CM

## 2021-07-15 DIAGNOSIS — M0579 Rheumatoid arthritis with rheumatoid factor of multiple sites without organ or systems involvement: Secondary | ICD-10-CM | POA: Diagnosis not present

## 2021-07-15 DIAGNOSIS — Z9189 Other specified personal risk factors, not elsewhere classified: Secondary | ICD-10-CM | POA: Diagnosis not present

## 2021-07-15 DIAGNOSIS — F03A0 Unspecified dementia, mild, without behavioral disturbance, psychotic disturbance, mood disturbance, and anxiety: Secondary | ICD-10-CM | POA: Diagnosis not present

## 2021-07-15 DIAGNOSIS — Z87891 Personal history of nicotine dependence: Secondary | ICD-10-CM | POA: Diagnosis not present

## 2021-07-15 DIAGNOSIS — R460 Very low level of personal hygiene: Secondary | ICD-10-CM | POA: Diagnosis not present

## 2021-07-15 DIAGNOSIS — I10 Essential (primary) hypertension: Secondary | ICD-10-CM | POA: Insufficient documentation

## 2021-07-15 DIAGNOSIS — Z1239 Encounter for other screening for malignant neoplasm of breast: Secondary | ICD-10-CM | POA: Diagnosis not present

## 2021-07-15 DIAGNOSIS — F17210 Nicotine dependence, cigarettes, uncomplicated: Secondary | ICD-10-CM

## 2021-07-15 DIAGNOSIS — M7502 Adhesive capsulitis of left shoulder: Secondary | ICD-10-CM | POA: Insufficient documentation

## 2021-07-15 DIAGNOSIS — L84 Corns and callosities: Secondary | ICD-10-CM | POA: Insufficient documentation

## 2021-07-15 DIAGNOSIS — Z1231 Encounter for screening mammogram for malignant neoplasm of breast: Secondary | ICD-10-CM

## 2021-07-15 DIAGNOSIS — R413 Other amnesia: Secondary | ICD-10-CM

## 2021-07-15 NOTE — Telephone Encounter (Signed)
Met with the patient and her grandson when they were in the office today.   Provided them with information about Advance Directives and explained the purpose of the document and how it is completed   Patient stated that she has not heard anything about the dental referral that was placed by Dr Margarita Rana last month.  This CM contacted Maren Reamer, referral specialist and she said she already sent patient resource list but will re-send the information.   Her grandson was interested in a program that would enable the patient to get out of the house  This CM explained about the PACE program and re-enforced with the patient that she can remain at home, this is just a day program.  Provided her grandson with written information about the program and he said he will definitely follow up . Instructed him to call this CM if he needs additional information /resources and he said he would.

## 2021-07-15 NOTE — Patient Instructions (Signed)
Physical therapy has been trying to reach you.  Please call to schedule your appointment. Placed in [place] [address] [place], [place] [address] Ph# [phone]

## 2021-07-15 NOTE — Progress Notes (Signed)
Very forgetful Pain in legs and back Pain on the bottom of left foot.

## 2021-07-15 NOTE — Progress Notes (Signed)
Subjective:  Patient ID: Kristy Oliver, female    DOB: 30-Jul-1945  Age: 76 y.o. MRN: [id number]  CC: weight loss   HPI Kristy Oliver is a 76 y.o. year old female with a history of hypertensiton and rheumatoid arthritis, osteoporosis, left hip, left humeral fracture status post nailing of left hip in 03/2018, pelvic fracture in 03/2020, previous tobacco abuse (quit in 2017 but smoked almost a pack a day for greater than 40 years) here for chronic disease management Kristy Oliver Carmon Ginsberg accompanies Kristy Oliver to today's visit. Kristy Oliver had a chronic disease management visit with me via telehealth last month.  Interval History: Kristy Oliver complains of moderate to severe pain which is mostly in Kristy Oliver L arm and Kristy Oliver L shoulder  with decreased ROM. Symptoms have been present x 1-2 years. I had referred Kristy Oliver to Kristy Oliver last visit to PT but notes in the chart revealed PT was unsuccessful in reaching Kristy Oliver to schedule an appointment.  Kristy Oliver is having memory changes involving recent and long term. Kristy Oliver forgets names, does not manage Kristy Oliver finances as Kristy Oliver grandson does this.  Kristy Oliver also does not recall surroundings Kristy Oliver has been to in the past.  Kristy Oliver stays at home mainly and plays with Kristy Oliver grandkids and great grandkids.  Kristy Oliver has a hard time chewing as Kristy Oliver has no teeth anagrams and states this is causing Kristy Oliver to lose weight.  On review of Kristy Oliver chart Kristy Oliver has actually gained some weight-about 8 pounds in the last 2 years.  I had referred Kristy Oliver to a dentist at Kristy Oliver last visit. Kristy Oliver has a painful callus on Kristy Oliver left foot which prevents Kristy Oliver from bearing weight.  Previously had this filed by podiatry but Kristy Oliver was lost to follow-up.  Past Medical History:  Diagnosis Date   Allergy    Arthritis    Asthma    as young person, none currently   Facial cellulitis 2/29/7989   Gout    Helicobacter pylori gastritis 12/13/2016   HTN (hypertension), malignant 11/17/2012   Hx of adenomatous colonic polyps 12/29/2016   Hyperlipidemia    medicine currently    Hypertension    Osteoporosis    Renal disorder    Tuberculosis    pt took meds for 9 months - finish meds last week     Past Surgical History:  Procedure Laterality Date   BREAST LUMPECTOMY     c-section x 2     COLONOSCOPY     JOINT REPLACEMENT     left knee & left shoulder   left breast cyst removal     UPPER GASTROINTESTINAL ENDOSCOPY     WRIST SURGERY  2018   x2    Family History  Problem Relation Age of Onset   Colon cancer Neg Hx    Esophageal cancer Neg Hx    Rectal cancer Neg Hx    Stomach cancer Neg Hx     Allergies  Allergen Reactions   Clindamycin Hives, Rash and Swelling   Metrizamide     unknown   Motrin [Ibuprofen] Other (See Comments)    blackout States Kristy Oliver is only allergic to 800 mg formulation   Iodinated Contrast Media Rash    unknown unknown   Milk-Related Compounds Diarrhea   Shrimp [Shellfish Allergy] Hives, Swelling and Rash    Outpatient Medications Prior to Visit  Medication Sig Dispense Refill   alendronate (FOSAMAX) 70 MG tablet TAKE 1 TABLET BY MOUTH EVERY 7 DAYS, TAKE WITH A FULL GLASS OF WATER ON AN EMPTY  STOMACH 12 tablet 1   amLODipine (NORVASC) 10 MG tablet TAKE 1 TABLET BY MOUTH DAILY 90 tablet 1   atorvastatin (LIPITOR) 40 MG tablet Take 1 tablet (40 mg total) by mouth daily. 90 tablet 1   carvedilol (COREG) 6.25 MG tablet TAKE 1 TABLET BY MOUTH TWICE DAILY WITH MEALS 180 tablet 1   cetirizine (ZYRTEC) 10 MG tablet TAKE 1 TABLET BY MOUTH ONCE DAILY 28 tablet 3   diclofenac Sodium (VOLTAREN) 1 % GEL APPLY 4 GRAMS TOPICALLY 4 (FOUR) TIMES DAILY. 100 g 1   fluticasone (FLONASE) 50 MCG/ACT nasal spray INSTILL 2 SPRAYS INTO EACH NOSTRIL DAILY 16 mL 1   folic acid (FOLVITE) 627 MCG tablet Take 1 tablet (400 mcg total) by mouth daily. 90 tablet 1   leflunomide (ARAVA) 10 MG tablet Take 10 mg by mouth daily.     methotrexate (RHEUMATREX) 2.5 MG tablet Take 3 tablets by mouth weekly.     mirtazapine (REMERON) 30 MG tablet Take 1  tablet (30 mg total) by mouth at bedtime. 90 tablet 1   mupirocin ointment (BACTROBAN) 2 % Apply 1 application topically 2 (two) times daily. 30 g 2   oxybutynin (DITROPAN) 5 MG tablet Take 1 tablet (5 mg total) by mouth 2 (two) times daily. 180 tablet 1   predniSONE (DELTASONE) 20 MG tablet Take 1 tablet (20 mg total) by mouth daily with breakfast. 5 tablet 0   tiZANidine (ZANAFLEX) 4 MG tablet TAKE 1 TABLET BY MOUTH EVERY 12 HOURS AS NEEDED FOR MUSCLE SPASM 60 tablet 2   traMADol (ULTRAM) 50 MG tablet Take 1 tablet (50 mg total) by mouth at bedtime as needed. 30 tablet 1   triamcinolone cream (KENALOG) 0.1 % APPLY 1 APPLICATION TOPICALLY 2 TIMES DAILY 80 g 0   doxycycline (VIBRA-TABS) 100 MG tablet Take 1 tablet (100 mg total) by mouth 2 (two) times daily. (Patient not taking: Reported on 06/29/2019) 20 tablet 0   Facility-Administered Medications Prior to Visit  Medication Dose Route Frequency Provider Last Rate Last Admin   0.9 %  sodium chloride infusion  500 mL Intravenous Continuous Gatha Mayer, MD         ROS Review of Systems  Constitutional:  Negative for activity change, appetite change and fatigue.  HENT:  Negative for congestion, sinus pressure and sore throat.   Eyes:  Negative for visual disturbance.  Respiratory:  Negative for cough, chest tightness, shortness of breath and wheezing.   Cardiovascular:  Negative for chest pain and palpitations.  Gastrointestinal:  Negative for abdominal distention, abdominal pain and constipation.  Endocrine: Negative for polydipsia.  Genitourinary:  Negative for dysuria and frequency.  Musculoskeletal:        See HPI  Skin:  Positive for rash.  Neurological:  Negative for tremors, light-headedness and numbness.  Hematological:  Does not bruise/bleed easily.  Psychiatric/Behavioral:  Negative for agitation and behavioral problems.    Objective:  BP 134/78    Pulse 80    Ht 5' (1.524 m)    Wt 127 lb (57.6 kg)    SpO2 97%    BMI 24.80  kg/m   BP/Weight 07/15/2021 06/03/2020 0/35/0093  Systolic BP 818 299 371  Diastolic BP 78 696 89  Wt. (Lbs) 127 124 119  BMI 24.8 22.68 21.77      Physical Exam Constitutional:      Appearance: Kristy Oliver is well-developed.  Cardiovascular:     Rate and Rhythm: Normal rate.     Heart  sounds: Normal heart sounds. No murmur heard. Pulmonary:     Effort: Pulmonary effort is normal.     Breath sounds: Normal breath sounds. No wheezing or rales.  Chest:     Chest wall: No tenderness.  Abdominal:     General: Bowel sounds are normal. There is no distension.     Palpations: Abdomen is soft. There is no mass.     Tenderness: There is no abdominal tenderness.  Musculoskeletal:     Right lower leg: No edema.     Left lower leg: No edema.     Comments: Kyphoscoliosis Rheumatoid nodules in all extremities Severely restricted abduction in left shoulder limited to 30 degrees Callus on mid aspect of left sole which is tender to palpation  Neurological:     Mental Status: Kristy Oliver is alert and oriented to person, place, and time.  Psychiatric:        Mood and Affect: Mood normal.    CMP Latest Ref Rng & Units 06/03/2020 12/05/2019 07/12/2019  Glucose 65 - 99 mg/dL 80 99 114(H)  BUN 8 - 27 mg/dL 16 11 7(L)  Creatinine 0.57 - 1.00 mg/dL 0.91 0.65 0.80  Sodium 134 - 144 mmol/L 143 139 141  Potassium 3.5 - 5.2 mmol/L 4.0 3.8 3.9  Chloride 96 - 106 mmol/L 105 103 105  CO2 20 - 29 mmol/L 19(L) 20 22  Calcium 8.7 - 10.3 mg/dL 10.2 9.8 9.7  Total Protein 6.0 - 8.5 g/dL 7.9 8.4 -  Total Bilirubin 0.0 - 1.2 mg/dL 0.4 0.3 -  Alkaline Phos 44 - 121 IU/L 367(H) 303(H) -  AST 0 - 40 IU/L 28 70(H) -  ALT 0 - 32 IU/L 38(H) 32 -    Lipid Panel     Component Value Date/Time   CHOL 301 (H) 06/03/2020 1043   TRIG 122 06/03/2020 1043   HDL 116 06/03/2020 1043   CHOLHDL 2.6 06/03/2020 1043   CHOLHDL 2.6 07/02/2015 0915   VLDL 20 07/02/2015 0915   LDLCALC 165 (H) 06/03/2020 1043    CBC    Component  Value Date/Time   WBC 7.7 12/05/2019 1138   WBC 7.7 08/20/2017 1526   RBC 4.85 12/05/2019 1138   RBC 4.51 08/20/2017 1526   HGB 14.9 12/05/2019 1138   HCT 44.3 12/05/2019 1138   PLT 216 12/05/2019 1138   MCV 91 12/05/2019 1138   MCH 30.7 12/05/2019 1138   MCH 30.6 08/20/2017 1526   MCHC 33.6 12/05/2019 1138   MCHC 34.3 08/20/2017 1526   RDW 12.8 12/05/2019 1138   LYMPHSABS 1.6 12/05/2019 1138   MONOABS 1,197 (H) 05/11/2016 0816   EOSABS 0.2 12/05/2019 1138   BASOSABS 0.1 12/05/2019 1138    Lab Results  Component Value Date   HGBA1C 5.5 11/02/2016    MMSE - Mini Mental State Exam 07/15/2021  Orientation to time 3  Orientation to Place 3  Registration 3  Attention/ Calculation 5  Recall 1  Language- name 2 objects 2  Language- repeat 1  Language- follow 3 step command 3  Language- read & follow direction 1  Write a sentence 1  Copy design 1  Total score 24      Assessment & Plan:  1. Essential hypertension Initially elevated with BP of 154/91 which improved on repeat to 134/78 Continue current regimen Counseled on blood pressure goal of less than 130/80, low-sodium, DASH diet, medication compliance, 150 minutes of moderate intensity exercise per week. Discussed medication compliance, adverse effects.  2. Rheumatoid arthritis involving multiple sites with positive rheumatoid factor (HCC) Uncontrolled - end stage rheumatoid per last Rheumatology notes Not on DMARDS Last month I referred Kristy Oliver back to Rheumatology  3. Adhesive capsulitis of left shoulder Uncontrolled Underlying rheumatoid also contributing I had referred Kristy Oliver to PATIENT last month but they were unable to reach Kristy Oliver to schedule. I have provided Kristy Oliver with the information to reschedule Kristy Oliver currently has tizanidine, Tramadol for pain - AMB referral to orthopedics  4. Poor oral hygiene Refer to dentist at past visit We will again provide him with resources for dental care  5. Callus of  foot Uncontrolled - Ambulatory referral to Podiatry  6. Smoking greater than 20 pack years Kristy Oliver has quit smoking but does qualify for screening - CT CHEST LUNG CANCER SCREENING LOW DOSE WO CONTRAST; Future  7. Encounter for screening mammogram for malignant neoplasm of breast - MM 3D SCREEN BREAST BILATERAL; Future  8. Memory changes Mini-Mental exam reveals mild memory loss with score of 24/30.  We will proceed with additional work-up for dementia.  Kristy Oliver probably has early Alzheimer's.  We have discussed possible medications to slow progression of memory loss however the patient is not open to this. Case manager called in to see patient and family to discuss possible day programs Kristy Oliver will benefit from.  We have discussed process of engaging in daily games and learning new activities to stimulate memory.  We have also provided them with advanced directives forms so this can be signed as the grandson is currently in charge of Kristy Oliver care. - CT HEAD WO CONTRAST (5MM); Future - Thyroid Panel With TSH - Vitamin B12   No orders of the defined types were placed in this encounter.  47 minutes of total face to face time spent including median intraservice time reviewing previous notes and test results, counseling patient on diagnosis and work up of Dementia, discussing advanced care planning, day program options in addition to management of chronic medical conditions.Time also spent ordering medications, investigations and documenting in the chart.  All questions were answered to the patient's satisfaction   Follow-up: Return in about 3 months (around 10/12/2021) for Chronic medical conditions.       Charlott Rakes, MD, FAAFP. [facility] and [place], [place]   07/15/2021, 11:29 AM

## 2021-07-16 LAB — CMP14+EGFR
ALT: 23 IU/L (ref 0–32)
AST: 41 IU/L — ABNORMAL HIGH (ref 0–40)
Albumin/Globulin Ratio: 0.8 — ABNORMAL LOW (ref 1.2–2.2)
Albumin: 3.9 g/dL (ref 3.7–4.7)
Alkaline Phosphatase: 329 IU/L — ABNORMAL HIGH (ref 44–121)
BUN/Creatinine Ratio: 8 — ABNORMAL LOW (ref 12–28)
BUN: 6 mg/dL — ABNORMAL LOW (ref 8–27)
Bilirubin Total: 0.4 mg/dL (ref 0.0–1.2)
CO2: 21 mmol/L (ref 20–29)
Calcium: 9.5 mg/dL (ref 8.7–10.3)
Chloride: 104 mmol/L (ref 96–106)
Creatinine, Ser: 0.76 mg/dL (ref 0.57–1.00)
Globulin, Total: 4.7 g/dL — ABNORMAL HIGH (ref 1.5–4.5)
Glucose: 100 mg/dL — ABNORMAL HIGH (ref 70–99)
Potassium: 4 mmol/L (ref 3.5–5.2)
Sodium: 139 mmol/L (ref 134–144)
Total Protein: 8.6 g/dL — ABNORMAL HIGH (ref 6.0–8.5)
eGFR: 83 mL/min/{1.73_m2} (ref >59)

## 2021-07-16 LAB — THYROID PANEL WITH TSH
Free Thyroxine Index: 2.3 (ref 1.2–4.9)
T3 Uptake Ratio: 23 % — ABNORMAL LOW (ref 24–39)
T4, Total: 9.8 ug/dL (ref 4.5–12.0)
TSH: 3.05 u[IU]/mL (ref 0.450–4.500)

## 2021-07-16 LAB — VITAMIN B12: Vitamin B-12: 1224 pg/mL (ref 232–1245)

## 2021-07-18 ENCOUNTER — Telehealth: Payer: Self-pay

## 2021-07-18 ENCOUNTER — Ambulatory Visit: Payer: Medicare Other | Admitting: Orthopedic Surgery

## 2021-07-18 NOTE — Telephone Encounter (Signed)
Contacted pt to go over lab results spoke with pt grandson and he doesn't have any questions or concerns

## 2021-07-24 ENCOUNTER — Ambulatory Visit (HOSPITAL_COMMUNITY): Payer: Medicare Other | Attending: Family Medicine

## 2021-07-25 ENCOUNTER — Ambulatory Visit: Payer: Medicare Other | Admitting: Podiatry

## 2021-08-14 ENCOUNTER — Ambulatory Visit: Payer: Medicare Other | Admitting: Family Medicine

## 2021-10-09 ENCOUNTER — Ambulatory Visit (INDEPENDENT_AMBULATORY_CARE_PROVIDER_SITE_OTHER): Payer: Medicare Other | Admitting: Podiatry

## 2021-10-09 DIAGNOSIS — Z91199 Patient's noncompliance with other medical treatment and regimen due to unspecified reason: Secondary | ICD-10-CM

## 2021-10-09 NOTE — Progress Notes (Signed)
Patient was no-show for appointment today 

## 2021-10-13 ENCOUNTER — Ambulatory Visit: Payer: Medicare Other | Admitting: Family Medicine

## 2021-10-22 ENCOUNTER — Ambulatory Visit: Payer: Medicare Other | Admitting: Medical

## 2021-10-29 ENCOUNTER — Ambulatory Visit (INDEPENDENT_AMBULATORY_CARE_PROVIDER_SITE_OTHER): Payer: Medicare Other | Admitting: Medical

## 2021-10-29 ENCOUNTER — Encounter: Payer: Self-pay | Admitting: Medical

## 2021-10-29 VITALS — BP 140/78 | HR 49 | Temp 98.5°F | Resp 18 | Ht 60.0 in | Wt 121.0 lb

## 2021-10-29 DIAGNOSIS — E785 Hyperlipidemia, unspecified: Secondary | ICD-10-CM | POA: Diagnosis not present

## 2021-10-29 DIAGNOSIS — I10 Essential (primary) hypertension: Secondary | ICD-10-CM | POA: Diagnosis not present

## 2021-10-29 DIAGNOSIS — M069 Rheumatoid arthritis, unspecified: Secondary | ICD-10-CM

## 2021-10-29 DIAGNOSIS — Z1231 Encounter for screening mammogram for malignant neoplasm of breast: Secondary | ICD-10-CM

## 2021-10-29 DIAGNOSIS — Z122 Encounter for screening for malignant neoplasm of respiratory organs: Secondary | ICD-10-CM

## 2021-10-29 DIAGNOSIS — M8080XA Other osteoporosis with current pathological fracture, unspecified site, initial encounter for fracture: Secondary | ICD-10-CM

## 2021-10-29 DIAGNOSIS — Z8709 Personal history of other diseases of the respiratory system: Secondary | ICD-10-CM

## 2021-10-29 DIAGNOSIS — M1000 Idiopathic gout, unspecified site: Secondary | ICD-10-CM

## 2021-10-29 DIAGNOSIS — N3941 Urge incontinence: Secondary | ICD-10-CM

## 2021-10-29 NOTE — Patient Instructions (Addendum)
Htn- Bp borderline controlled. Continue amlodipine 10 mg daily and coreg 6.25 twice daily.   High cholesterol- Atorvastatin 40 mg daily. Check cmp and lipid panel today.  RA off meds for year or more. Obvious RA changes to hands and other joints. Placed referral back to former rheumatologist.  urinary incontinence. On ditropan 5 mg bid. Insomnia- on remeron 30 mg h qs.  Osteoporosis- fosomax 70 mg weekly.  Referral to pulmonologist for screening ct of chest. Screening mammogram order placed today. Can discuss with xray dept at [facility].  Follow up mid to late sept or sooner if needed.

## 2021-10-29 NOTE — Progress Notes (Addendum)
Subjective:    Patient ID: Kristy Oliver, female    DOB: Nov 10, 1945, 76 y.o.   MRN: [id number]  HPI  Pt in for first time.  Pt is retired. No regular exercise. Not very active. No coffee. Rare ginger ale.  Pt stopped 5 years ago. Smoked teenager until 44 years. 1-1.5 pack a day for those years. Social/rare occasion.  See below med hx.  Also some urinary incontinence. On ditropan 5 mg bid. Insomnia- on remeron 30 mg h qs.  Osteoporosis- fosomax 70 mg daily.  Htn-Amlodipine 10 mg daily, coreg 6.25 twice daily.   High cholesterol- Atorvastatin.  RA-in past was on methotrexate, prednisone and leflunomide. On eview it appears las time saw rheumatologist 01-2020    Past Medical History:  Diagnosis Date   Allergy    Arthritis    Asthma    as young person, none currently   Facial cellulitis 9/67/8938   Gout    Helicobacter pylori gastritis 12/13/2016   HTN (hypertension), malignant 11/17/2012   Hx of adenomatous colonic polyps 12/29/2016   Hyperlipidemia    medicine currently   Hypertension    Osteoporosis    Renal disorder    Tuberculosis    pt took meds for 9 months - finish meds last week      Social History   Socioeconomic History   Marital status: Single    Spouse name: Not on file   Number of children: Not on file   Years of education: Not on file   Highest education level: Not on file  Occupational History   Not on file  Tobacco Use   Smoking status: Former    Packs/day: 0.25    Types: Cigarettes    Quit date: 07/07/2017    Years since quitting: 4.3   Smokeless tobacco: Never   Tobacco comments:    4 cigs daily  Vaping Use   Vaping Use: Never used  Substance and Sexual Activity   Alcohol use: No   Drug use: No    Types: Marijuana    Comment: 04/14/2017   Sexual activity: Never    Birth control/protection: I.U.D.  Other Topics Concern   Not on file  Social History Narrative   Not on file   Social Determinants of Health   Financial Resource  Strain: Not on file  Food Insecurity: Not on file  Transportation Needs: Not on file  Physical Activity: Not on file  Stress: Not on file  Social Connections: Not on file  Intimate Partner Violence: Not on file    Past Surgical History:  Procedure Laterality Date   BREAST LUMPECTOMY     c-section x 2     COLONOSCOPY     JOINT REPLACEMENT     left knee & left shoulder   left breast cyst removal     UPPER GASTROINTESTINAL ENDOSCOPY     WRIST SURGERY  2018   x2    Family History  Problem Relation Age of Onset   Colon cancer Neg Hx    Esophageal cancer Neg Hx    Rectal cancer Neg Hx    Stomach cancer Neg Hx     Allergies  Allergen Reactions   Clindamycin Hives, Rash and Swelling   Metrizamide     unknown   Motrin [Ibuprofen] Other (See Comments)    blackout States she is only allergic to 800 mg formulation   Iodinated Contrast Media Rash    unknown unknown   Milk-Related Compounds Diarrhea   Shrimp [  Shellfish Allergy] Hives, Swelling and Rash    Current Outpatient Medications on File Prior to Visit  Medication Sig Dispense Refill   alendronate (FOSAMAX) 70 MG tablet TAKE 1 TABLET BY MOUTH EVERY 7 DAYS, TAKE WITH A FULL GLASS OF WATER ON AN EMPTY STOMACH 12 tablet 1   amLODipine (NORVASC) 10 MG tablet TAKE 1 TABLET BY MOUTH DAILY 90 tablet 1   atorvastatin (LIPITOR) 40 MG tablet Take 1 tablet (40 mg total) by mouth daily. 90 tablet 1   carvedilol (COREG) 6.25 MG tablet TAKE 1 TABLET BY MOUTH TWICE DAILY WITH MEALS 180 tablet 1   cetirizine (ZYRTEC) 10 MG tablet TAKE 1 TABLET BY MOUTH ONCE DAILY 28 tablet 3   diclofenac Sodium (VOLTAREN) 1 % GEL APPLY 4 GRAMS TOPICALLY 4 (FOUR) TIMES DAILY. 100 g 1   doxycycline (VIBRA-TABS) 100 MG tablet Take 1 tablet (100 mg total) by mouth 2 (two) times daily. (Patient not taking: Reported on 06/29/2019) 20 tablet 0   fluticasone (FLONASE) 50 MCG/ACT nasal spray INSTILL 2 SPRAYS INTO EACH NOSTRIL DAILY 16 mL 1   folic acid (FOLVITE)  462 MCG tablet Take 1 tablet (400 mcg total) by mouth daily. 90 tablet 1   leflunomide (ARAVA) 10 MG tablet Take 10 mg by mouth daily.     methotrexate (RHEUMATREX) 2.5 MG tablet Take 3 tablets by mouth weekly.     mirtazapine (REMERON) 30 MG tablet Take 1 tablet (30 mg total) by mouth at bedtime. 90 tablet 1   mupirocin ointment (BACTROBAN) 2 % Apply 1 application topically 2 (two) times daily. 30 g 2   oxybutynin (DITROPAN) 5 MG tablet Take 1 tablet (5 mg total) by mouth 2 (two) times daily. 180 tablet 1   predniSONE (DELTASONE) 20 MG tablet Take 1 tablet (20 mg total) by mouth daily with breakfast. 5 tablet 0   tiZANidine (ZANAFLEX) 4 MG tablet TAKE 1 TABLET BY MOUTH EVERY 12 HOURS AS NEEDED FOR MUSCLE SPASM 60 tablet 2   traMADol (ULTRAM) 50 MG tablet Take 1 tablet (50 mg total) by mouth at bedtime as needed. 30 tablet 1   triamcinolone cream (KENALOG) 0.1 % APPLY 1 APPLICATION TOPICALLY 2 TIMES DAILY 80 g 0   Current Facility-Administered Medications on File Prior to Visit  Medication Dose Route Frequency Provider Last Rate Last Admin   0.9 %  sodium chloride infusion  500 mL Intravenous Continuous Gatha Mayer, MD        BP 140/78   Pulse (!) 49   Temp 98.5 F (36.9 C)   Resp 18   Ht 5' (1.524 m)   Wt 121 lb (54.9 kg)   SpO2 100%   BMI 23.63 kg/m      140/78  Review of Systems Negative excepet stiff hands and elobws. Mild pain    Objective:   Physical Exam  General- No acute distress. Pleasant patient. Neck- Full range of motion, no jvd Lungs- Clear, even and unlabored. Heart- regular rate and rhythm. Neurologic- CNII- XII grossly intact.  MSK- sever arthritic changes to both hands. Lower- calfs symmetric. No pedal edema.    Assessment & Plan:   Patient Instructions  Htn- Bp borderline controlled. Continue amlodipine 10 mg daily and coreg 6.25 twice daily.   High cholesterol- Atorvastatin 40 mg daily. Check cmp and lipid panel today.  RA off meds for year  or more. Obvious RA changes to hands and other joints. Placed referral back to former rheumatologist.  urinary incontinence. On  ditropan 5 mg bid. Insomnia- on remeron 30 mg h qs.  Osteoporosis- fosomax 70 mg weekly.  Referral to pulmonologist for screening ct of chest. Screening mammogram order placed today. Can discuss with [facility] dept at [facility].  Follow up mid to late sept or sooner if needed.    Mackie Pai, PA-C

## 2021-10-30 LAB — COMPREHENSIVE METABOLIC PANEL
ALT: 14 U/L (ref 0–35)
AST: 28 U/L (ref 0–37)
Albumin: 3.5 g/dL (ref 3.5–5.2)
Alkaline Phosphatase: 226 U/L — ABNORMAL HIGH (ref 39–117)
BUN: 7 mg/dL (ref 6–23)
CO2: 25 mEq/L (ref 19–32)
Calcium: 9.3 mg/dL (ref 8.4–10.5)
Chloride: 104 mEq/L (ref 96–112)
Creatinine, Ser: 0.72 mg/dL (ref 0.40–1.20)
GFR: 82.94 mL/min (ref >60.00)
Glucose, Bld: 101 mg/dL — ABNORMAL HIGH (ref 70–99)
Potassium: 3.9 mEq/L (ref 3.5–5.1)
Sodium: 137 mEq/L (ref 135–145)
Total Bilirubin: 0.4 mg/dL (ref 0.2–1.2)
Total Protein: 8.7 g/dL — ABNORMAL HIGH (ref 6.0–8.3)

## 2021-10-30 LAB — LIPID PANEL
Cholesterol: 155 mg/dL (ref 0–200)
HDL: 42.2 mg/dL (ref >39.00)
LDL Cholesterol: 99 mg/dL (ref 0–99)
NonHDL: 113.08
Total CHOL/HDL Ratio: 4
Triglycerides: 68 mg/dL (ref 0.0–149.0)
VLDL: 13.6 mg/dL (ref 0.0–40.0)

## 2021-11-11 ENCOUNTER — Telehealth (HOSPITAL_BASED_OUTPATIENT_CLINIC_OR_DEPARTMENT_OTHER): Payer: Self-pay

## 2021-12-03 ENCOUNTER — Telehealth: Payer: Self-pay | Admitting: Medical

## 2021-12-03 NOTE — Telephone Encounter (Signed)
Left message for patient to call back and schedule Medicare Annual Wellness Visit (AWV).   Please offer to do virtually or by telephone.  Left office number and my jabber [phone].  AWVI elligible as of  04/24/2014  Please schedule at anytime with Nurse Health Advisor.

## 2021-12-09 ENCOUNTER — Ambulatory Visit (HOSPITAL_BASED_OUTPATIENT_CLINIC_OR_DEPARTMENT_OTHER)
Admission: RE | Admit: 2021-12-09 | Discharge: 2021-12-09 | Disposition: A | Discharge status: Home / Self Care | Payer: Medicare Other | Source: Ambulatory Visit | Attending: Medical | Admitting: Medical

## 2021-12-09 ENCOUNTER — Telehealth: Payer: Self-pay | Admitting: Medical

## 2021-12-09 ENCOUNTER — Encounter (HOSPITAL_BASED_OUTPATIENT_CLINIC_OR_DEPARTMENT_OTHER): Payer: Self-pay

## 2021-12-09 DIAGNOSIS — Z1231 Encounter for screening mammogram for malignant neoplasm of breast: Secondary | ICD-10-CM | POA: Insufficient documentation

## 2021-12-09 NOTE — Telephone Encounter (Signed)
Medication: oxybutynin (DITROPAN) 5 MG tablet  Has the patient contacted their pharmacy? Yes.     Preferred Pharmacy:  [facility] [address] [place] [address] C:[phone] N:[phone]

## 2021-12-10 ENCOUNTER — Other Ambulatory Visit: Payer: Self-pay

## 2021-12-10 LAB — HM MAMMOGRAPHY

## 2021-12-10 MED ORDER — OXYBUTYNIN CHLORIDE 5 MG PO TABS: 5.0000 mg | ORAL_TABLET | Freq: Two times a day (BID) | ORAL | 1 refills | Status: DC

## 2021-12-10 NOTE — Addendum Note (Signed)
Addended by: Anabel Halon on: 12/10/2021 02:13 PM   Modules accepted: Orders

## 2022-01-08 ENCOUNTER — Other Ambulatory Visit: Payer: Self-pay | Admitting: Medical

## 2022-01-08 DIAGNOSIS — I10 Essential (primary) hypertension: Secondary | ICD-10-CM

## 2022-01-08 DIAGNOSIS — R630 Anorexia: Secondary | ICD-10-CM

## 2022-01-08 NOTE — Telephone Encounter (Signed)
Medication Refill - Medication: Amlodipine 10 mg /atorvastatin 40 mg carvedlol 6.25  /mirtazapine (REMERON) 30 MG tablet  Has the patient contacted their pharmacy? Pharmacy called directly in    (Agent: If no, request that the patient contact the pharmacy for the refill. If patient does not wish to contact the pharmacy document the reason why and proceed with request.) (Agent: If yes, when and what did the pharmacy advise?)Pharmacy called directly in  Preferred Pharmacy (with phone number or street name):  [place], [place] Phone:  [phone]  Fax:  [phone]     Has the patient been seen for an appointment in the last year OR does the patient have an upcoming appointment? yes  Agent: Please be advised that RX refills may take up to 3 business days. We ask that you follow-up with your pharmacy.

## 2022-01-09 NOTE — Telephone Encounter (Signed)
Refused these refills because she has established care at [facility] with [employer], PA-C

## 2022-01-16 ENCOUNTER — Other Ambulatory Visit: Payer: Self-pay | Admitting: Family Medicine

## 2022-01-16 DIAGNOSIS — R630 Anorexia: Secondary | ICD-10-CM

## 2022-01-16 DIAGNOSIS — I10 Essential (primary) hypertension: Secondary | ICD-10-CM

## 2022-01-19 NOTE — Telephone Encounter (Signed)
Requested medication (s) are due for refill today:   Yes   [facility] provider to review for refills.   Pt has established with another provider  Requested medication (s) are on the active medication list:   Yes  Future visit scheduled:   Not with [facility]   but with Mackie Pai, PA-C with [facility] [employer] [facility]   Last ordered: An emergency refill is being requested.   These were refused a week ago due to being with another provider.   Now being requested for emergency refill.   Returned for provider to review for refills.   Requested Prescriptions  Pending Prescriptions Disp Refills   carvedilol (COREG) 6.25 MG tablet [Pharmacy Med Name: CARVEDILOL 6.25 MG TABS 6.25 Tablet] 60 tablet 10    Sig: TAKE 1 TABLET BY MOUTH TWICE DAILY WITH MEALS *EMERGENCY REFILL*     Cardiovascular: Beta Blockers 3 Failed - 01/16/2022  6:48 PM      Failed - Last BP in normal range    BP Readings from Last 1 Encounters:  10/29/21 140/78         Failed - Valid encounter within last 6 months    Recent Outpatient Visits           6 months ago Essential hypertension   [place], Charlane Ferretti, MD   7 months ago Adhesive capsulitis of left shoulder   [place], [place], MD   1 year ago Closed nondisplaced fracture of pelvis with routine healing, unspecified part of pelvis, subsequent encounter   [place], Charlane Ferretti, MD   2 years ago Heart beat abnormality   [place], Charlane Ferretti, MD   2 years ago Rheumatoid arthritis involving multiple sites with positive rheumatoid factor ([place])   [place], Charlane Ferretti, MD       Future Appointments             In 2 weeks Saguier, Percell Miller, PA-C [employer] at [facility], [place] in normal range and within 360 days    Creat  Date  Value Ref Range Status  08/20/2017 0.68 0.50 - 0.99 mg/dL Final    Comment:    For patients >48 years of age, the reference limit for Creatinine is approximately 13% higher for people identified as African-American. .    Creatinine, Ser  Date Value Ref Range Status  10/29/2021 0.72 0.40 - 1.20 mg/dL Final         Passed - AST in normal range and within 360 days    AST  Date Value Ref Range Status  10/29/2021 28 0 - 37 U/L Final         Passed - ALT in normal range and within 360 days    ALT  Date Value Ref Range Status  10/29/2021 14 0 - 35 U/L Final         Passed - Last Heart Rate in normal range    Pulse Readings from Last 1 Encounters:  10/29/21 (!) 49          mirtazapine (REMERON) 30 MG tablet [Pharmacy Med Name: MIRTAZAPINE 30 MG TABLET 30 Tablet] 30 tablet 10    Sig: TAKE 1 TABLET BY MOUTH EVERY NIGHT AT BEDTIME *EMERGENCY REFILL*     Psychiatry: Antidepressants - mirtazapine  Failed - 01/16/2022  6:48 PM      Failed - Completed PHQ-2 or PHQ-9 in the last 360 days      Failed - Valid encounter within last 6 months    Recent Outpatient Visits           6 months ago Essential hypertension   [place], Charlane Ferretti, MD   7 months ago Adhesive capsulitis of left shoulder   [place], Charlane Ferretti, MD   1 year ago Closed nondisplaced fracture of pelvis with routine healing, unspecified part of pelvis, subsequent encounter   [place], Charlane Ferretti, MD   2 years ago Heart beat abnormality   [place], Charlane Ferretti, MD   2 years ago Rheumatoid arthritis involving multiple sites with positive rheumatoid factor ([place])   [place], Charlane Ferretti, MD       Future Appointments             In 2 weeks Saguier, Percell Miller, PA-C [place] [facility], [facility]              amLODipine (NORVASC) 10 MG tablet [employer] Med Name: AMLODIPINE '10MG'$  TAB 10 Tablet] 30 tablet 10    Sig: TAKE 1 TABLET BY MOUTH DAILY *EMERGENCY REFILL*     Cardiovascular: Calcium Channel Blockers 2 Failed - 01/16/2022  6:48 PM      Failed - Last BP in normal range    BP Readings from Last 1 Encounters:  10/29/21 140/78         Failed - Valid encounter within last 6 months    Recent Outpatient Visits           6 months ago Essential hypertension   [place], Charlane Ferretti, MD   7 months ago Adhesive capsulitis of left shoulder   [place], [place], MD   1 year ago Closed nondisplaced fracture of pelvis with routine healing, unspecified part of pelvis, subsequent encounter   [place], MD   2 years ago Heart beat abnormality   [place], Charlane Ferretti, MD   2 years ago Rheumatoid arthritis involving multiple sites with positive rheumatoid factor ([place])   [place], Charlane Ferretti, MD       Future Appointments             In 2 weeks Saguier, Percell Miller, PA-C [employer] at [place] in normal range    Pulse Readings from Last 1 Encounters:  10/29/21 (!) 49          atorvastatin (LIPITOR) 40 MG tablet [Pharmacy Med Name: ATORVASTATIN '40MG'$  TAB 40 Tablet] 30 tablet 10    Sig: TAKE 1 TABLET BY MOUTH DAILY *EMERGENCY REFILL*     Cardiovascular:  Antilipid - Statins Failed - 01/16/2022  6:48 PM      Failed - Lipid Panel in normal range within the last 12 months    Cholesterol, Total  Date Value Ref Range Status  06/03/2020 301 (H) 100 - 199 mg/dL Final   Cholesterol  Date Value Ref Range Status  10/29/2021 155 0 - 200 mg/dL Final    Comment:  ATP III Classification       Desirable:  < 200 mg/dL               Borderline  High:  200 - 239 mg/dL          High:  > = 240 mg/dL   LDL Chol Calc (NIH)  Date Value Ref Range Status  06/03/2020 165 (H) 0 - 99 mg/dL Final   LDL Cholesterol  Date Value Ref Range Status  10/29/2021 99 0 - 99 mg/dL Final   HDL  Date Value Ref Range Status  10/29/2021 42.20 >39.00 mg/dL Final  06/03/2020 116 >39 mg/dL Final   Triglycerides  Date Value Ref Range Status  10/29/2021 68.0 0.0 - 149.0 mg/dL Final    Comment:    Normal:  <150 mg/dLBorderline High:  150 - 199 mg/dL         Passed - Patient is not pregnant      Passed - Valid encounter within last 12 months    Recent Outpatient Visits           6 months ago Essential hypertension   [place], [place], MD   7 months ago Adhesive capsulitis of left shoulder   [place], Charlane Ferretti, MD   1 year ago Closed nondisplaced fracture of pelvis with routine healing, unspecified part of pelvis, subsequent encounter   [place], Charlane Ferretti, MD   2 years ago Heart beat abnormality   [place], Charlane Ferretti, MD   2 years ago Rheumatoid arthritis involving multiple sites with positive rheumatoid factor [facility])   [place], [place], MD       Future Appointments             In 2 weeks Saguier, Percell Miller, PA-C [employer] at [facility], [facility]

## 2022-01-22 ENCOUNTER — Other Ambulatory Visit: Payer: Self-pay | Admitting: Family Medicine

## 2022-01-22 DIAGNOSIS — I10 Essential (primary) hypertension: Secondary | ICD-10-CM

## 2022-01-22 DIAGNOSIS — R630 Anorexia: Secondary | ICD-10-CM

## 2022-02-02 ENCOUNTER — Ambulatory Visit: Payer: Medicare Other | Admitting: Medical

## 2022-02-05 ENCOUNTER — Other Ambulatory Visit: Payer: Self-pay | Admitting: Family Medicine

## 2022-02-05 DIAGNOSIS — I10 Essential (primary) hypertension: Secondary | ICD-10-CM

## 2022-02-05 DIAGNOSIS — R630 Anorexia: Secondary | ICD-10-CM

## 2022-02-06 ENCOUNTER — Other Ambulatory Visit: Payer: Self-pay | Admitting: Family Medicine

## 2022-02-06 DIAGNOSIS — R630 Anorexia: Secondary | ICD-10-CM

## 2022-02-06 DIAGNOSIS — I10 Essential (primary) hypertension: Secondary | ICD-10-CM

## 2022-02-09 NOTE — Telephone Encounter (Signed)
Attempted to contact patient to review if she has received medications from mail order pharmacy signed 02/06/22. No answer, unable to leave VM to call back.

## 2022-02-09 NOTE — Telephone Encounter (Signed)
Requested medication (s) are due for refill today: yes  Requested medication (s) are on the active medication list: yes  Last refill:  02/06/22 0 refills   Future visit scheduled: no   Notes to clinic:  do you want to signed for another courtesy refill? Rx signed 02/06/22. Called patient to scheduled future appt. No answer. Unable to leave message.      Requested Prescriptions  Pending Prescriptions Disp Refills   amLODipine (NORVASC) 10 MG tablet [Pharmacy Med Name: AMLODIPINE '10MG'$  TAB 10 Tablet] 30 tablet 10    Sig: TAKE 1 TABLET BY MOUTH DAILY *EMERGENCY REFILL*     Cardiovascular: Calcium Channel Blockers 2 Failed - 02/06/2022 10:31 PM      Failed - Last BP in normal range    BP Readings from Last 1 Encounters:  10/29/21 140/78         Failed - Valid encounter within last 6 months    Recent Outpatient Visits           6 months ago Essential hypertension   [place], Charlane Ferretti, MD   7 months ago Adhesive capsulitis of left shoulder   [place], Charlane Ferretti, MD   1 year ago Closed nondisplaced fracture of pelvis with routine healing, unspecified part of pelvis, subsequent encounter   [place], Charlane Ferretti, MD   2 years ago Heart beat abnormality   [place], Charlane Ferretti, MD   2 years ago Rheumatoid arthritis involving multiple sites with positive rheumatoid factor ([place])   [place], Charlane Ferretti, MD              Passed - Last Heart Rate in normal range    Pulse Readings from Last 1 Encounters:  10/29/21 (!) 49          atorvastatin (LIPITOR) 40 MG tablet [Pharmacy Med Name: ATORVASTATIN '40MG'$  TAB 40 Tablet] 30 tablet 10    Sig: TAKE 1 TABLET BY MOUTH DAILY *EMERGENCY REFILL*     Cardiovascular:  Antilipid - Statins Failed - 02/06/2022 10:31 PM      Failed - Lipid Panel in normal range within  the last 12 months    Cholesterol, Total  Date Value Ref Range Status  06/03/2020 301 (H) 100 - 199 mg/dL Final   Cholesterol  Date Value Ref Range Status  10/29/2021 155 0 - 200 mg/dL Final    Comment:    ATP III Classification       Desirable:  < 200 mg/dL               Borderline High:  200 - 239 mg/dL          High:  > = 240 mg/dL   LDL Chol Calc (NIH)  Date Value Ref Range Status  06/03/2020 165 (H) 0 - 99 mg/dL Final   LDL Cholesterol  Date Value Ref Range Status  10/29/2021 99 0 - 99 mg/dL Final   HDL  Date Value Ref Range Status  10/29/2021 42.20 >39.00 mg/dL Final  06/03/2020 116 >39 mg/dL Final   Triglycerides  Date Value Ref Range Status  10/29/2021 68.0 0.0 - 149.0 mg/dL Final    Comment:    Normal:  <150 mg/dLBorderline High:  150 - 199 mg/dL         Passed - Patient is not pregnant      Passed - Valid  encounter within last 12 months    Recent Outpatient Visits           6 months ago Essential hypertension   [place], [place], MD   7 months ago Adhesive capsulitis of left shoulder   [place], [place], MD   1 year ago Closed nondisplaced fracture of pelvis with routine healing, unspecified part of pelvis, subsequent encounter   [place], Charlane Ferretti, MD   2 years ago Heart beat abnormality   [place], Charlane Ferretti, MD   2 years ago Rheumatoid arthritis involving multiple sites with positive rheumatoid factor ([place])   [place], [place], MD               carvedilol (COREG) 6.25 MG tablet [Pharmacy Med Name: CARVEDILOL 6.25 MG TABS 6.25 Tablet] 60 tablet 10    Sig: TAKE 1 TABLET BY MOUTH TWICE DAILY WITH MEALS *EMERGENCY REFILL*     Cardiovascular: Beta Blockers 3 Failed - 02/06/2022 10:31 PM      Failed - Last BP in normal range    BP Readings from Last 1  Encounters:  10/29/21 140/78         Failed - Valid encounter within last 6 months    Recent Outpatient Visits           6 months ago Essential hypertension   [place], Charlane Ferretti, MD   7 months ago Adhesive capsulitis of left shoulder   [place], Charlane Ferretti, MD   1 year ago Closed nondisplaced fracture of pelvis with routine healing, unspecified part of pelvis, subsequent encounter   [place], Charlane Ferretti, MD   2 years ago Heart beat abnormality   [place], Charlane Ferretti, MD   2 years ago Rheumatoid arthritis involving multiple sites with positive rheumatoid factor [facility])   [place], Charlane Ferretti, MD              Passed - Cr in normal range and within 360 days    Creat  Date Value Ref Range Status  08/20/2017 0.68 0.50 - 0.99 mg/dL Final    Comment:    For patients >61 years of age, the reference limit for Creatinine is approximately 13% higher for people identified as African-American. .    Creatinine, Ser  Date Value Ref Range Status  10/29/2021 0.72 0.40 - 1.20 mg/dL Final         Passed - AST in normal range and within 360 days    AST  Date Value Ref Range Status  10/29/2021 28 0 - 37 U/L Final         Passed - ALT in normal range and within 360 days    ALT  Date Value Ref Range Status  10/29/2021 14 0 - 35 U/L Final         Passed - Last Heart Rate in normal range    Pulse Readings from Last 1 Encounters:  10/29/21 (!) 49          mirtazapine (REMERON) 30 MG tablet [Pharmacy Med Name: MIRTAZAPINE 30 MG TABLET 30 Tablet] 30 tablet 10    Sig: TAKE 1 TABLET BY MOUTH EVERY NIGHT AT BEDTIME *EMERGENCY REFILL*     Psychiatry: Antidepressants - mirtazapine Failed -  02/06/2022 10:31 PM      Failed - Completed PHQ-2 or PHQ-9 in the last 360 days      Failed - Valid encounter within  last 6 months    Recent Outpatient Visits           6 months ago Essential hypertension   [place], Charlane Ferretti, MD   7 months ago Adhesive capsulitis of left shoulder   [place], Charlane Ferretti, MD   1 year ago Closed nondisplaced fracture of pelvis with routine healing, unspecified part of pelvis, subsequent encounter   [place], [place], MD   2 years ago Heart beat abnormality   [place], Charlane Ferretti, MD   2 years ago Rheumatoid arthritis involving multiple sites with positive rheumatoid factor [facility])   [place] [facility] Charlott Rakes, MD

## 2022-02-13 ENCOUNTER — Other Ambulatory Visit: Payer: Self-pay | Admitting: Internal Medicine

## 2022-02-13 DIAGNOSIS — R630 Anorexia: Secondary | ICD-10-CM

## 2022-02-13 DIAGNOSIS — I10 Essential (primary) hypertension: Secondary | ICD-10-CM

## 2022-02-16 NOTE — Telephone Encounter (Signed)
All medications were refilled 02/06/2022. Requested Prescriptions  Pending Prescriptions Disp Refills  . mirtazapine (REMERON) 30 MG tablet [Pharmacy Med Name: MIRTAZAPINE 30 MG TABLET 30 Tablet] 30 tablet 10    Sig: TAKE 1 TABLET BY MOUTH AT BEDTIME     Psychiatry: Antidepressants - mirtazapine Failed - 02/13/2022 10:54 PM      Failed - Completed PHQ-2 or PHQ-9 in the last 360 days      Failed - Valid encounter within last 6 months    Recent Outpatient Visits          7 months ago Essential hypertension   [place], Charlane Ferretti, MD   8 months ago Adhesive capsulitis of left shoulder   [place], [place], MD   1 year ago Closed nondisplaced fracture of pelvis with routine healing, unspecified part of pelvis, subsequent encounter   [place], Charlane Ferretti, MD   2 years ago Heart beat abnormality   [place], Charlane Ferretti, MD   2 years ago Rheumatoid arthritis involving multiple sites with positive rheumatoid factor ([place])   [place], [place], MD             . amLODipine (NORVASC) 10 MG tablet [Pharmacy Med Name: AMLODIPINE '10MG'$  TAB 10 Tablet] 30 tablet 10    Sig: TAKE 1 TABLET BY MOUTH DAILY *PATIENT NEEDS APPOINTMENT*     Cardiovascular: Calcium Channel Blockers 2 Failed - 02/13/2022 10:54 PM      Failed - Last BP in normal range    BP Readings from Last 1 Encounters:  10/29/21 140/78         Failed - Valid encounter within last 6 months    Recent Outpatient Visits          7 months ago Essential hypertension   [place], Charlane Ferretti, MD   8 months ago Adhesive capsulitis of left shoulder   [place], [place], MD   1 year ago Closed nondisplaced fracture of pelvis with routine healing, unspecified part of pelvis, subsequent  encounter   [place], Charlane Ferretti, MD   2 years ago Heart beat abnormality   [place], Charlane Ferretti, MD   2 years ago Rheumatoid arthritis involving multiple sites with positive rheumatoid factor [facility])   [place], Charlane Ferretti, MD             Passed - Last Heart Rate in normal range    Pulse Readings from Last 1 Encounters:  10/29/21 (!) 49         . carvedilol (COREG) 6.25 MG tablet [Pharmacy Med Name: CARVEDILOL 6.25 MG TABS 6.25 Tablet] 60 tablet 10    Sig: TAKE ONE (1) TABLET BY MOUTH TWICE DAILY WITH MEALS     Cardiovascular: Beta Blockers 3 Failed - 02/13/2022 10:54 PM      Failed - Last BP in normal range    BP Readings from Last 1 Encounters:  10/29/21 140/78         Failed - Valid encounter within last 6 months    Recent Outpatient Visits          7 months ago Essential hypertension   [place], Charlane Ferretti, MD   8 months ago Adhesive capsulitis of  left shoulder   [place], [place], MD   1 year ago Closed nondisplaced fracture of pelvis with routine healing, unspecified part of pelvis, subsequent encounter   [place], Charlane Ferretti, MD   2 years ago Heart beat abnormality   [place], Charlane Ferretti, MD   2 years ago Rheumatoid arthritis involving multiple sites with positive rheumatoid factor [facility])   [place], Charlane Ferretti, MD             Passed - Cr in normal range and within 360 days    Creat  Date Value Ref Range Status  08/20/2017 0.68 0.50 - 0.99 mg/dL Final    Comment:    For patients >41 years of age, the reference limit for Creatinine is approximately 13% higher for people identified as African-American. .    Creatinine, Ser  Date Value Ref Range Status  10/29/2021  0.72 0.40 - 1.20 mg/dL Final         Passed - AST in normal range and within 360 days    AST  Date Value Ref Range Status  10/29/2021 28 0 - 37 U/L Final         Passed - ALT in normal range and within 360 days    ALT  Date Value Ref Range Status  10/29/2021 14 0 - 35 U/L Final         Passed - Last Heart Rate in normal range    Pulse Readings from Last 1 Encounters:  10/29/21 (!) 49         . atorvastatin (LIPITOR) 40 MG tablet [Pharmacy Med Name: ATORVASTATIN '40MG'$  TAB 40 Tablet] 30 tablet 10    Sig: TAKE 1 TABLET BY MOUTH DAILY *PATIENT NEEDS APPOINTMENT*     Cardiovascular:  Antilipid - Statins Failed - 02/13/2022 10:54 PM      Failed - Lipid Panel in normal range within the last 12 months    Cholesterol, Total  Date Value Ref Range Status  06/03/2020 301 (H) 100 - 199 mg/dL Final   Cholesterol  Date Value Ref Range Status  10/29/2021 155 0 - 200 mg/dL Final    Comment:    ATP III Classification       Desirable:  < 200 mg/dL               Borderline High:  200 - 239 mg/dL          High:  > = 240 mg/dL   LDL Chol Calc (NIH)  Date Value Ref Range Status  06/03/2020 165 (H) 0 - 99 mg/dL Final   LDL Cholesterol  Date Value Ref Range Status  10/29/2021 99 0 - 99 mg/dL Final   HDL  Date Value Ref Range Status  10/29/2021 42.20 >39.00 mg/dL Final  06/03/2020 116 >39 mg/dL Final   Triglycerides  Date Value Ref Range Status  10/29/2021 68.0 0.0 - 149.0 mg/dL Final    Comment:    Normal:  <150 mg/dLBorderline High:  150 - 199 mg/dL         Passed - Patient is not pregnant      Passed - Valid encounter within last 12 months    Recent Outpatient Visits          7 months ago Essential hypertension   [place], Charlane Ferretti, MD   8 months ago Adhesive capsulitis of  left shoulder   [place], [place], MD   1 year ago Closed nondisplaced fracture of pelvis with routine healing, unspecified  part of pelvis, subsequent encounter   [place], Enobong, MD   2 years ago Heart beat abnormality   [place], Charlane Ferretti, MD   2 years ago Rheumatoid arthritis involving multiple sites with positive rheumatoid factor [facility])   [place] [facility] Charlott Rakes, MD

## 2022-03-23 ENCOUNTER — Encounter: Payer: Self-pay | Admitting: Internal Medicine

## 2022-04-13 ENCOUNTER — Encounter: Payer: Self-pay | Admitting: Medical

## 2022-04-13 DIAGNOSIS — N3941 Urge incontinence: Secondary | ICD-10-CM

## 2022-04-13 DIAGNOSIS — R630 Anorexia: Secondary | ICD-10-CM

## 2022-04-13 DIAGNOSIS — M818 Other osteoporosis without current pathological fracture: Secondary | ICD-10-CM

## 2022-04-13 DIAGNOSIS — R21 Rash and other nonspecific skin eruption: Secondary | ICD-10-CM

## 2022-04-13 DIAGNOSIS — M0579 Rheumatoid arthritis with rheumatoid factor of multiple sites without organ or systems involvement: Secondary | ICD-10-CM

## 2022-04-13 DIAGNOSIS — M419 Scoliosis, unspecified: Secondary | ICD-10-CM

## 2022-04-13 DIAGNOSIS — J328 Other chronic sinusitis: Secondary | ICD-10-CM

## 2022-04-13 DIAGNOSIS — I10 Essential (primary) hypertension: Secondary | ICD-10-CM

## 2022-04-13 MED ORDER — FOLIC ACID 400 MCG PO TABS: 400.0000 ug | ORAL_TABLET | Freq: Every day | ORAL | 1 refills | Status: AC

## 2022-04-13 MED ORDER — AMLODIPINE BESYLATE 10 MG PO TABS: 10.0000 mg | ORAL_TABLET | Freq: Every day | ORAL | 0 refills | Status: DC

## 2022-04-13 MED ORDER — MIRTAZAPINE 30 MG PO TABS: 30.0000 mg | ORAL_TABLET | Freq: Every day | ORAL | 0 refills | Status: DC

## 2022-04-13 MED ORDER — ATORVASTATIN CALCIUM 40 MG PO TABS: 40.0000 mg | ORAL_TABLET | Freq: Every day | ORAL | 0 refills | Status: DC

## 2022-04-13 MED ORDER — DICLOFENAC SODIUM 1 % EX GEL: CUTANEOUS | 1 refills | Status: DC

## 2022-04-13 MED ORDER — CARVEDILOL 6.25 MG PO TABS: 6.2500 mg | ORAL_TABLET | Freq: Two times a day (BID) | ORAL | 0 refills | Status: DC

## 2022-04-13 MED ORDER — FLUTICASONE PROPIONATE 50 MCG/ACT NA SUSP: NASAL | 1 refills | Status: DC

## 2022-04-13 MED ORDER — OXYBUTYNIN CHLORIDE 5 MG PO TABS: 5.0000 mg | ORAL_TABLET | Freq: Two times a day (BID) | ORAL | 1 refills | Status: DC

## 2022-04-13 MED ORDER — ALENDRONATE SODIUM 70 MG PO TABS: ORAL_TABLET | ORAL | 1 refills | Status: DC

## 2022-04-13 MED ORDER — TRIAMCINOLONE ACETONIDE 0.1 % EX CREA: TOPICAL_CREAM | CUTANEOUS | 0 refills | Status: DC

## 2022-04-20 ENCOUNTER — Other Ambulatory Visit: Payer: Self-pay | Admitting: Medical

## 2022-04-20 DIAGNOSIS — R630 Anorexia: Secondary | ICD-10-CM

## 2022-04-20 DIAGNOSIS — I10 Essential (primary) hypertension: Secondary | ICD-10-CM

## 2022-04-21 ENCOUNTER — Other Ambulatory Visit: Payer: Self-pay | Admitting: Medical

## 2022-04-21 DIAGNOSIS — R21 Rash and other nonspecific skin eruption: Secondary | ICD-10-CM

## 2022-04-24 ENCOUNTER — Other Ambulatory Visit: Payer: Self-pay

## 2022-04-24 DIAGNOSIS — R630 Anorexia: Secondary | ICD-10-CM

## 2022-04-24 DIAGNOSIS — M818 Other osteoporosis without current pathological fracture: Secondary | ICD-10-CM

## 2022-04-24 DIAGNOSIS — R21 Rash and other nonspecific skin eruption: Secondary | ICD-10-CM

## 2022-04-24 DIAGNOSIS — I10 Essential (primary) hypertension: Secondary | ICD-10-CM

## 2022-04-24 MED ORDER — ATORVASTATIN CALCIUM 40 MG PO TABS: ORAL_TABLET | ORAL | 10 refills | Status: DC

## 2022-04-24 MED ORDER — ALENDRONATE SODIUM 70 MG PO TABS: ORAL_TABLET | ORAL | 1 refills | Status: DC

## 2022-04-24 MED ORDER — CARVEDILOL 6.25 MG PO TABS: ORAL_TABLET | ORAL | 10 refills | Status: DC

## 2022-04-24 MED ORDER — AMLODIPINE BESYLATE 10 MG PO TABS: ORAL_TABLET | ORAL | 10 refills | Status: DC

## 2022-04-24 MED ORDER — MIRTAZAPINE 30 MG PO TABS: 30.0000 mg | ORAL_TABLET | Freq: Every day | ORAL | 10 refills | Status: DC

## 2022-04-24 MED ORDER — TRIAMCINOLONE ACETONIDE 0.1 % EX CREA: TOPICAL_CREAM | CUTANEOUS | 10 refills | Status: DC

## 2022-05-14 ENCOUNTER — Other Ambulatory Visit: Payer: Self-pay | Admitting: Medical

## 2022-05-14 DIAGNOSIS — J328 Other chronic sinusitis: Secondary | ICD-10-CM

## 2022-05-28 ENCOUNTER — Ambulatory Visit (INDEPENDENT_AMBULATORY_CARE_PROVIDER_SITE_OTHER): Payer: Medicare Other | Admitting: Medical

## 2022-05-28 ENCOUNTER — Encounter: Payer: Self-pay | Admitting: Medical

## 2022-05-28 ENCOUNTER — Other Ambulatory Visit: Payer: Self-pay | Admitting: Medical

## 2022-05-28 VITALS — BP 138/85 | HR 75 | Resp 18 | Ht 60.0 in | Wt 112.8 lb

## 2022-05-28 DIAGNOSIS — R413 Other amnesia: Secondary | ICD-10-CM | POA: Diagnosis not present

## 2022-05-28 DIAGNOSIS — M818 Other osteoporosis without current pathological fracture: Secondary | ICD-10-CM | POA: Diagnosis not present

## 2022-05-28 DIAGNOSIS — N3941 Urge incontinence: Secondary | ICD-10-CM

## 2022-05-28 DIAGNOSIS — R350 Frequency of micturition: Secondary | ICD-10-CM

## 2022-05-28 DIAGNOSIS — I10 Essential (primary) hypertension: Secondary | ICD-10-CM

## 2022-05-28 DIAGNOSIS — J45909 Unspecified asthma, uncomplicated: Secondary | ICD-10-CM

## 2022-05-28 MED ORDER — ALBUTEROL SULFATE HFA 108 (90 BASE) MCG/ACT IN AERS: 2.0000 | INHALATION_SPRAY | Freq: Four times a day (QID) | RESPIRATORY_TRACT | 0 refills | Status: DC | PRN

## 2022-05-28 MED ORDER — MUPIROCIN 2 % EX OINT: 1.0000 | TOPICAL_OINTMENT | Freq: Two times a day (BID) | CUTANEOUS | 1 refills | Status: DC

## 2022-05-28 MED ORDER — BUDESONIDE-FORMOTEROL FUMARATE 160-4.5 MCG/ACT IN AERO: 2.0000 | INHALATION_SPRAY | Freq: Two times a day (BID) | RESPIRATORY_TRACT | 12 refills | Status: DC

## 2022-05-28 MED ORDER — DONEPEZIL HCL 5 MG PO TABS: 5.0000 mg | ORAL_TABLET | Freq: Every day | ORAL | 1 refills | Status: DC

## 2022-05-28 NOTE — Progress Notes (Signed)
Subjective:    Patient ID: Kristy Oliver, female    DOB: [birth year], 77 y.o.   MRN: [id number]  HPI Pt in for follow up. I saw pt first time back in June 2023.  urinary incontinence. On ditropan 5 mg bid. Insomnia- on remeron 30 mg h qs.  Osteoporosis- fosomax 70 mg daily.   Htn-Amlodipine 10 mg daily, coreg 6.25 twice daily.     High cholesterol- Atorvastatin.   RA-seen in November. Plan below.  Plan/Orders Continue MTX 3 tablets weekly. Continue allopurinol 200 mg daily. Continue leflunomide 10 mg daily. Labs for therapy monitoring and potential drug toxicities. Return in about 3 months (around 07/01/2022).    Will get flu vaccine today.  Pt get some occasional   Occasional breakdown of skin/small scabs. They heal quickly.   Some mild wheezing at night. She had this for a long time. In past would use daughter ventolin and helped. Pt had asthma as a chid. Pt also smoked from 77 yo to 21 yo pack a day.     Review of Systems  Constitutional:  Negative for chills, fatigue and fever.  HENT:  Negative for congestion, drooling and ear pain.   Respiratory:  Negative for cough, chest tightness, shortness of breath and wheezing.   Cardiovascular:  Negative for chest pain and palpitations.  Gastrointestinal:  Negative for abdominal pain.  Genitourinary:  Negative for dyspareunia and flank pain.  Musculoskeletal:  Negative for back pain and neck pain.  Skin:  Negative for rash.  Neurological:  Negative for dizziness, seizures and light-headedness.  Psychiatric/Behavioral:  Negative for agitation, confusion, dysphoric mood and sleep disturbance. The patient is not nervous/anxious.    Past Medical History:  Diagnosis Date   Allergy    Arthritis    Asthma    as young person, none currently   Facial cellulitis 09/15/5359   Gout    Helicobacter pylori gastritis 12/13/2016   HTN (hypertension), malignant 11/17/2012   Hx of adenomatous colonic polyps 12/29/2016    Hyperlipidemia    medicine currently   Hypertension    Osteoporosis    Renal disorder    Tuberculosis    pt took meds for 9 months - finish meds last week      Social History   Socioeconomic History   Marital status: Single    Spouse name: Not on file   Number of children: Not on file   Years of education: Not on file   Highest education level: Not on file  Occupational History   Not on file  Tobacco Use   Smoking status: Former    Packs/day: 1.00    Years: 50.00    Total pack years: 50.00    Types: Cigarettes    Quit date: 07/07/2017    Years since quitting: 4.8   Smokeless tobacco: Never   Tobacco comments:    4 cigs daily  Vaping Use   Vaping Use: Never used  Substance and Sexual Activity   Alcohol use: No   Drug use: No    Types: Marijuana    Comment: 04/14/2017   Sexual activity: Never    Birth control/protection: I.U.D.  Other Topics Concern   Not on file  Social History Narrative   Not on file   Social Determinants of Health   Financial Resource Strain: Not on file  Food Insecurity: Not on file  Transportation Needs: Not on file  Physical Activity: Not on file  Stress: Not on file  Social Connections: Not on  file  Intimate Partner Violence: Not on file    Past Surgical History:  Procedure Laterality Date   BREAST LUMPECTOMY     c-section x 2     COLONOSCOPY     JOINT REPLACEMENT     left knee & left shoulder   left breast cyst removal     UPPER GASTROINTESTINAL ENDOSCOPY     WRIST SURGERY  2018   x2    Family History  Problem Relation Age of Onset   Colon cancer Neg Hx    Esophageal cancer Neg Hx    Rectal cancer Neg Hx    Stomach cancer Neg Hx     Allergies  Allergen Reactions   Clindamycin Hives, Rash and Swelling   Metrizamide     unknown   Motrin [Ibuprofen] Other (See Comments)    blackout States she is only allergic to 800 mg formulation   Iodinated Contrast Media Rash    unknown unknown   Milk-Related Compounds  Diarrhea   Shrimp [Shellfish Allergy] Hives, Swelling and Rash    Current Outpatient Medications on File Prior to Visit  Medication Sig Dispense Refill   alendronate (FOSAMAX) 70 MG tablet TAKE 1 TABLET BY MOUTH EVERY 7 DAYS, TAKE WITH A FULL GLASS OF WATER ON AN EMPTY STOMACH 12 tablet 1   amLODipine (NORVASC) 10 MG tablet TAKE 1 TABLET BY MOUTH DAILY *MUST HAVE OFFICE VISIT FOR REFILLS* 30 tablet 10   atorvastatin (LIPITOR) 40 MG tablet TAKE 1 TABLET ('40MG'$ ) BY MOUTH DAILY *MUST HAVE OFFICE VISIT FOR REFILLS* 30 tablet 10   carvedilol (COREG) 6.25 MG tablet TAKE ONE (1) TABLET BY MOUTH TWICE DAILY WITH A MEAL 60 tablet 10   cetirizine (ZYRTEC) 10 MG tablet TAKE 1 TABLET BY MOUTH ONCE DAILY 28 tablet 3   diclofenac Sodium (VOLTAREN) 1 % GEL APPLY 4 GRAMS TOPICALLY 4 (FOUR) TIMES DAILY. 100 g 1   doxycycline (VIBRA-TABS) 100 MG tablet Take 1 tablet (100 mg total) by mouth 2 (two) times daily. (Patient not taking: Reported on 06/29/2019) 20 tablet 0   fluticasone (FLONASE) 50 MCG/ACT nasal spray Place 2 sprays into both nostrils daily. 16 g 5   folic acid (FOLVITE) 810 MCG tablet Take 1 tablet (400 mcg total) by mouth daily. 90 tablet 1   leflunomide (ARAVA) 10 MG tablet Take 10 mg by mouth daily.     methotrexate (RHEUMATREX) 2.5 MG tablet Take 3 tablets by mouth weekly.     mirtazapine (REMERON) 30 MG tablet Take 1 tablet (30 mg total) by mouth at bedtime. 30 tablet 10   mupirocin ointment (BACTROBAN) 2 % Apply 1 application topically 2 (two) times daily. 30 g 2   oxybutynin (DITROPAN) 5 MG tablet Take 1 tablet (5 mg total) by mouth 2 (two) times daily. 180 tablet 1   predniSONE (DELTASONE) 20 MG tablet Take 1 tablet (20 mg total) by mouth daily with breakfast. 5 tablet 0   tiZANidine (ZANAFLEX) 4 MG tablet TAKE 1 TABLET BY MOUTH EVERY 12 HOURS AS NEEDED FOR MUSCLE SPASM 60 tablet 2   traMADol (ULTRAM) 50 MG tablet Take 1 tablet (50 mg total) by mouth at bedtime as needed. 30 tablet 1    triamcinolone cream (KENALOG) 0.1 % APPLY 1 APPLICATION TOPICALLY TWICE DAILY 80 g 10   Current Facility-Administered Medications on File Prior to Visit  Medication Dose Route Frequency Provider Last Rate Last Admin   0.9 %  sodium chloride infusion  500 mL Intravenous Continuous Carlean Purl,  Ofilia Neas, MD        BP 138/85 Comment: prior check was done thru thick sweat shirt.  Pulse 75   Resp 18   Ht 5' (1.524 m)   Wt 112 lb 12.8 oz (51.2 kg)   SpO2 96%   BMI 22.03 kg/m        Objective:   Physical Exam  General Mental Status- Alert. General Appearance- Not in acute distress.   Skin Left trapezius. Small 3 mm break in epidermis. No dc. No redness. No swelling. Adjacent to bra.   Neck Carotid Arteries- Normal color. Moisture- Normal Moisture. No carotid bruits. No JVD.  Chest and Lung Exam Auscultation: Breath Sounds:-Normal.  Cardiovascular Auscultation:Rythm- Regular. Murmurs & Other Heart Sounds:Auscultation of the heart reveals- No Murmurs.  Abdomen Inspection:-Inspeection Normal. Palpation/Percussion:Note:No mass. Palpation and Percussion of the abdomen reveal- Non Tender, Non Distended + BS, no rebound or guarding.   Neurologic Cranial Nerve exam:- CN III-XII intact(No nystagmus), symmetric smile. Strength:- 5/5 equal and symmetric strength both upper and lower extremities.       Assessment & Plan:   Patient Instructions  Htn- bp controlled today on recheck. Continue Amlodipine 10 mg daily, coreg 6.25 twice daily.  Urine incontinence- continue ditropan 5 mg twice daily.  Insomnia- on remeron 30 mg h qs.  Osteoporosis- fosomax 70 mg daily.   High cholesterol-  continue atorvastatin.  Memory changes/decreasing MMSE score 22. Age related cognitive decline vs dementia. Rx aricept.  Small abrasion near bra line. Rx mupirocin.  For wheezing, asthma and history of smoking will rx symbicort inhaler and albuterol inhaler.   Follow up in one month or sooner if  needed.        Mackie Pai, PA-C    Time spent with patient today was 40 minutes which consisted of chart review, discussing diagnosis, work up treatment and documentation.

## 2022-05-28 NOTE — Patient Instructions (Addendum)
Htn- bp controlled today on recheck. Continue Amlodipine 10 mg daily, coreg 6.25 twice daily.  Urine incontinence- continue ditropan 5 mg twice daily. On discussion some frequent urination so will get urine culture.(Pt will bring back urine today. Gave sterile cup)  Insomnia- on remeron 30 mg h qs.  Osteoporosis- fosomax 70 mg daily.   High cholesterol-  continue atorvastatin.  Memory changes/decreasing MMSE score 22. Age related cognitive decline vs dementia. Rx aricept.  Small abrasion near bra line. Rx mupirocin.  For wheezing, asthma and history of smoking will rx symbicort inhaler and albuterol inhaler.   Follow up in one month or sooner if needed.

## 2022-09-03 ENCOUNTER — Other Ambulatory Visit: Payer: Self-pay | Admitting: Medical

## 2022-09-03 DIAGNOSIS — M818 Other osteoporosis without current pathological fracture: Secondary | ICD-10-CM

## 2022-09-18 ENCOUNTER — Ambulatory Visit: Payer: Medicare Other | Admitting: Medical

## 2022-09-21 ENCOUNTER — Ambulatory Visit (INDEPENDENT_AMBULATORY_CARE_PROVIDER_SITE_OTHER): Payer: Medicare Other | Admitting: Medical

## 2022-09-21 VITALS — BP 134/72 | HR 72 | Temp 98.0°F | Resp 18 | Ht 60.0 in | Wt 106.2 lb

## 2022-09-21 DIAGNOSIS — S50811A Abrasion of right forearm, initial encounter: Secondary | ICD-10-CM

## 2022-09-21 DIAGNOSIS — T148XXA Other injury of unspecified body region, initial encounter: Secondary | ICD-10-CM

## 2022-09-21 DIAGNOSIS — J45909 Unspecified asthma, uncomplicated: Secondary | ICD-10-CM

## 2022-09-21 DIAGNOSIS — Z87891 Personal history of nicotine dependence: Secondary | ICD-10-CM

## 2022-09-21 DIAGNOSIS — R4189 Other symptoms and signs involving cognitive functions and awareness: Secondary | ICD-10-CM | POA: Diagnosis not present

## 2022-09-21 DIAGNOSIS — M05741 Rheumatoid arthritis with rheumatoid factor of right hand without organ or systems involvement: Secondary | ICD-10-CM

## 2022-09-21 DIAGNOSIS — E785 Hyperlipidemia, unspecified: Secondary | ICD-10-CM

## 2022-09-21 DIAGNOSIS — E876 Hypokalemia: Secondary | ICD-10-CM

## 2022-09-21 DIAGNOSIS — I10 Essential (primary) hypertension: Secondary | ICD-10-CM | POA: Diagnosis not present

## 2022-09-21 DIAGNOSIS — M05742 Rheumatoid arthritis with rheumatoid factor of left hand without organ or systems involvement: Secondary | ICD-10-CM

## 2022-09-21 LAB — COMPREHENSIVE METABOLIC PANEL
ALT: 21 U/L (ref 0–35)
AST: 32 U/L (ref 0–37)
Albumin: 3.6 g/dL (ref 3.5–5.2)
Alkaline Phosphatase: 293 U/L — ABNORMAL HIGH (ref 39–117)
BUN: 8 mg/dL (ref 6–23)
CO2: 26 mEq/L (ref 19–32)
Calcium: 9.3 mg/dL (ref 8.4–10.5)
Chloride: 107 mEq/L (ref 96–112)
Creatinine, Ser: 0.68 mg/dL (ref 0.40–1.20)
GFR: 85.85 mL/min (ref >60.00)
Glucose, Bld: 92 mg/dL (ref 70–99)
Potassium: 3.3 mEq/L — ABNORMAL LOW (ref 3.5–5.1)
Sodium: 142 mEq/L (ref 135–145)
Total Bilirubin: 0.5 mg/dL (ref 0.2–1.2)
Total Protein: 8.1 g/dL (ref 6.0–8.3)

## 2022-09-21 LAB — LIPID PANEL
Cholesterol: 149 mg/dL (ref 0–200)
HDL: 47.8 mg/dL (ref >39.00)
LDL Cholesterol: 88 mg/dL (ref 0–99)
NonHDL: 101.12
Total CHOL/HDL Ratio: 3
Triglycerides: 68 mg/dL (ref 0.0–149.0)
VLDL: 13.6 mg/dL (ref 0.0–40.0)

## 2022-09-21 MED ORDER — MUPIROCIN 2 % EX OINT: 1.0000 | TOPICAL_OINTMENT | Freq: Two times a day (BID) | CUTANEOUS | 0 refills | Status: DC

## 2022-09-21 NOTE — Patient Instructions (Addendum)
1. Rheumatoid arthritis involving both hands with positive rheumatoid factor (HCC) Based on severity of changes on hand decided to use this as primary doses on CAP form and have occupational therapist come out to evaluate functionality.  2. Cognitive decline. MMSE 22.  Continue aricept.   3. Essential hypertension Continue Amlodipine 10 mg daily, coreg 6.25 twice daily.  4. Moderate asthma without complication, unspecified whether persistent Continue symbicort.  5. Hyperlipidemia, unspecified hyperlipidemia type  continue atorvastatin.  6. For hx of smoking placed referral to pulmonologist. They tried calling you months back. Please call there office for appoitment. [phone].  Small abrasion to rt forearm. Use mupirocin cream twice daily. If does not resolve. Then need to recheck.  Follow up 6 months or sooner if needed.

## 2022-09-21 NOTE — Progress Notes (Signed)
Subjective:    Patient ID: Kristy Oliver, female    DOB: January 16, 1946, 77 y.o.   MRN: [id number]  HPI  Last visit 05-28-2022. " Htn- bp controlled today on recheck. Continue Amlodipine 10 mg daily, coreg 6.25 twice daily.   Urine incontinence- continue ditropan 5 mg twice daily. On discussion some frequent urination so will get urine culture.(Pt will bring back urine today. Gave sterile cup)   Insomnia- on remeron 30 mg h qs.  Osteoporosis- fosomax 70 mg daily.     High cholesterol-  continue atorvastatin.   Memory changes/decreasing MMSE score 22. Age related cognitive decline vs dementia. Rx aricept.   Small abrasion near bra line. Rx mupirocin.   For wheezing, asthma and history of smoking will rx symbicort inhaler and albuterol inhaler.    "  On today visit bp controlled.  Insomnia- family member states she sleep well.  Pt family member states he thinks he cogntive function still declining some.   Pt stills wheezes some daily. Symbicort helped per family. Pt smoked from 77 yo to age 19 yo. 3/4 of pack a day.  RA changes to hands. Had changes for year per grandaughter. Per daughter limited use of her hands due to RA changes.   Also new small scab/breakdown of skin present for one day. No dc. No trauma that she knows.  Review of Systems  Constitutional:  Negative for chills, fatigue and fever.  HENT:  Negative for congestion, drooling and ear pain.   Respiratory:  Negative for cough, chest tightness, shortness of breath and wheezing.   Cardiovascular:  Negative for chest pain and palpitations.  Gastrointestinal:  Negative for abdominal pain.  Genitourinary:  Negative for dyspareunia and flank pain.  Musculoskeletal:  Negative for back pain and neck pain.       RA changes to hand. Not reporting severe pain but decreased function.    Skin:  Negative for rash.       New small scab to rt forearm just reported today by pt.  Neurological:  Negative for dizziness,  seizures and light-headedness.  Psychiatric/Behavioral:  Negative for agitation, confusion, dysphoric mood and sleep disturbance. The patient is not nervous/anxious.    Past Medical History:  Diagnosis Date   Allergy    Arthritis    Asthma    as young person, none currently   Facial cellulitis 11/17/2012   Gout    Helicobacter pylori gastritis 12/13/2016   HTN (hypertension), malignant 11/17/2012   Hx of adenomatous colonic polyps 12/29/2016   Hyperlipidemia    medicine currently   Hypertension    Osteoporosis    Renal disorder    Tuberculosis    pt took meds for 9 months - finish meds last week      Social History   Socioeconomic History   Marital status: Single    Spouse name: Not on file   Number of children: Not on file   Years of education: Not on file   Highest education level: 12th grade  Occupational History   Not on file  Tobacco Use   Smoking status: Former    Packs/day: 1.00    Years: 50.00    Additional pack years: 0.00    Total pack years: 50.00    Types: Cigarettes    Quit date: 07/07/2017    Years since quitting: 5.2   Smokeless tobacco: Never   Tobacco comments:    4 cigs daily  Vaping Use   Vaping Use: Never used  Substance  and Sexual Activity   Alcohol use: No   Drug use: No    Types: Marijuana    Comment: 04/14/2017   Sexual activity: Never    Birth control/protection: I.U.D.  Other Topics Concern   Not on file  Social History Narrative   Not on file   Social Determinants of Health   Financial Resource Strain: Low Risk  (09/17/2022)   Overall Financial Resource Strain (CARDIA)    Difficulty of Paying Living Expenses: Not very hard  Food Insecurity: No Food Insecurity (09/17/2022)   Hunger Vital Sign    Worried About Running Out of Food in the Last Year: Never true    Ran Out of Food in the Last Year: Never true  Transportation Needs: No Transportation Needs (09/17/2022)   PRAPARE - Administrator, Civil Service (Medical): No     Lack of Transportation (Non-Medical): No  Physical Activity: Insufficiently Active (09/17/2022)   Exercise Vital Sign    Days of Exercise per Week: 7 days    Minutes of Exercise per Session: 10 min  Stress: Stress Concern Present (09/17/2022)   [employer] of Occupational Health - Occupational Stress Questionnaire    Feeling of Stress : To some extent  Social Connections: Socially Isolated (09/17/2022)   Social Connection and Isolation Panel [NHANES]    Frequency of Communication with Friends and Family: Once a week    Frequency of Social Gatherings with Friends and Family: Three times a week    Attends Religious Services: Never    Active Member of Clubs or Organizations: No    Attends Engineer, structural: Not on file    Marital Status: Never married  Catering manager Violence: Not on file    Past Surgical History:  Procedure Laterality Date   BREAST LUMPECTOMY     c-section x 2     COLONOSCOPY     JOINT REPLACEMENT     left knee & left shoulder   left breast cyst removal     UPPER GASTROINTESTINAL ENDOSCOPY     WRIST SURGERY  2018   x2    Family History  Problem Relation Age of Onset   Colon cancer Neg Hx    Esophageal cancer Neg Hx    Rectal cancer Neg Hx    Stomach cancer Neg Hx     Allergies  Allergen Reactions   Clindamycin Hives, Rash and Swelling   Metrizamide     unknown   Motrin [Ibuprofen] Other (See Comments)    blackout States she is only allergic to 800 mg formulation   Iodinated Contrast Media Rash    unknown unknown   Milk-Related Compounds Diarrhea   Shrimp [Shellfish Allergy] Hives, Swelling and Rash    Current Outpatient Medications on File Prior to Visit  Medication Sig Dispense Refill   albuterol (VENTOLIN HFA) 108 (90 Base) MCG/ACT inhaler INHALE 2 PUFFS INTO THE LUNGS BY MOUTH EVERY 6 HOURS AS NEEDED 18 g 10   alendronate (FOSAMAX) 70 MG tablet Take 1 tablet (70 mg total) by mouth once a week. Take with full glass of  water on empty stomach 12 tablet 0   amLODipine (NORVASC) 10 MG tablet TAKE 1 TABLET BY MOUTH DAILY *MUST HAVE OFFICE VISIT FOR REFILLS* 30 tablet 10   atorvastatin (LIPITOR) 40 MG tablet TAKE 1 TABLET (40MG ) BY MOUTH DAILY *MUST HAVE OFFICE VISIT FOR REFILLS* 30 tablet 10   budesonide-formoterol (SYMBICORT) 160-4.5 MCG/ACT inhaler Inhale 2 puffs into the lungs 2 (two)  times daily. 1 each 12   carvedilol (COREG) 6.25 MG tablet TAKE ONE (1) TABLET BY MOUTH TWICE DAILY WITH A MEAL 60 tablet 10   cetirizine (ZYRTEC) 10 MG tablet TAKE 1 TABLET BY MOUTH ONCE DAILY 28 tablet 3   diclofenac Sodium (VOLTAREN) 1 % GEL APPLY 4 GRAMS TOPICALLY 4 (FOUR) TIMES DAILY. 100 g 1   donepezil (ARICEPT) 5 MG tablet Take 1 tablet (5 mg total) by mouth at bedtime. 90 tablet 1   doxycycline (VIBRA-TABS) 100 MG tablet Take 1 tablet (100 mg total) by mouth 2 (two) times daily. (Patient not taking: Reported on 06/29/2019) 20 tablet 0   fluticasone (FLONASE) 50 MCG/ACT nasal spray Place 2 sprays into both nostrils daily. 16 g 5   folic acid (FOLVITE) 400 MCG tablet Take 1 tablet (400 mcg total) by mouth daily. 90 tablet 1   leflunomide (ARAVA) 10 MG tablet Take 10 mg by mouth daily.     methotrexate (RHEUMATREX) 2.5 MG tablet Take 3 tablets by mouth weekly.     mirtazapine (REMERON) 30 MG tablet Take 1 tablet (30 mg total) by mouth at bedtime. 30 tablet 10   mupirocin ointment (BACTROBAN) 2 % Apply 1 application topically 2 (two) times daily. 30 g 2   mupirocin ointment (BACTROBAN) 2 % Apply 1 Application topically 2 (two) times daily. 22 g 1   oxybutynin (DITROPAN) 5 MG tablet Take 1 tablet (5 mg total) by mouth 2 (two) times daily. 180 tablet 1   predniSONE (DELTASONE) 20 MG tablet Take 1 tablet (20 mg total) by mouth daily with breakfast. 5 tablet 0   tiZANidine (ZANAFLEX) 4 MG tablet TAKE 1 TABLET BY MOUTH EVERY 12 HOURS AS NEEDED FOR MUSCLE SPASM 60 tablet 2   traMADol (ULTRAM) 50 MG tablet Take 1 tablet (50 mg total) by  mouth at bedtime as needed. 30 tablet 1   triamcinolone cream (KENALOG) 0.1 % APPLY 1 APPLICATION TOPICALLY TWICE DAILY 80 g 10   Current Facility-Administered Medications on File Prior to Visit  Medication Dose Route Frequency Provider Last Rate Last Admin   0.9 %  sodium chloride infusion  500 mL Intravenous Continuous Iva Boop, MD        BP 134/72   Pulse 72   Temp 98 F (36.7 C)   Resp 18   Ht 5' (1.524 m)   Wt 106 lb 3.2 oz (48.2 kg)   SpO2 98%   BMI 20.74 kg/m        Objective:   Physical Exam  General Mental Status- Alert. General Appearance- Not in acute distress.   Skin General: Color- Normal Color. Moisture- Normal Moisture.  Neck Carotid Arteries- Normal color. Moisture- Normal Moisture. No carotid bruits. No JVD.  Chest and Lung Exam Auscultation: Breath Sounds:-Normal.  Cardiovascular Auscultation:Rythm- Regular. Murmurs & Other Heart Sounds:Auscultation of the heart reveals- No Murmurs.  Abdomen Inspection:-Inspeection Normal. Palpation/Percussion:Note:No mass. Palpation and Percussion of the abdomen reveal- Non Tender, Non Distended + BS, no rebound or guarding.   Neurologic Cranial Nerve exam:- CN III-XII intact(No nystagmus), symmetric smile. Strength:- 5/5 equal and symmetric strength both upper and lower extremities.   Hands- severe arthritis changes to both hands.    Assessment & Plan:   Patient Instructions  1. Rheumatoid arthritis involving both hands with positive rheumatoid factor (HCC) Based on severity of changes on hand decided to use this as primary doses on CAP form and have occupational therapist come out to evaluate functionality.  2. Cognitive  decline. MMSE 22.  Continue aricept.   3. Essential hypertension Continue Amlodipine 10 mg daily, coreg 6.25 twice daily.  4. Moderate asthma without complication, unspecified whether persistent Continue symbicort.  5. Hyperlipidemia, unspecified hyperlipidemia type   continue atorvastatin.  6. For hx of smoking placed referral to pulmonologist. They tried calling you months back. Please call there office for appoitment. [phone].  Small abrasion to rt forearm. Use mupirocin cream twice daily. If does not resolve. Then need to recheck.  Follow up 6 months or sooner if needed.   Esperanza Richters, PA-C   Time spent with patient today was 42  minutes which consisted of chart review, discussing diagnosis, work up, treatment, filling out form during visit  and documentation.

## 2022-09-22 MED ORDER — POTASSIUM CHLORIDE CRYS ER 10 MEQ PO TBCR: 10.0000 meq | EXTENDED_RELEASE_TABLET | Freq: Every day | ORAL | 0 refills | Status: DC

## 2022-09-22 NOTE — Addendum Note (Signed)
Addended by: Gwenevere Abbot on: 09/22/2022 07:40 PM   Modules accepted: Orders

## 2022-09-26 ENCOUNTER — Other Ambulatory Visit: Payer: Self-pay | Admitting: Medical

## 2022-10-11 ENCOUNTER — Other Ambulatory Visit: Payer: Self-pay | Admitting: Medical

## 2022-10-15 ENCOUNTER — Encounter: Payer: Self-pay | Admitting: Medical

## 2022-10-18 ENCOUNTER — Other Ambulatory Visit: Payer: Self-pay | Admitting: Medical

## 2022-10-18 DIAGNOSIS — J328 Other chronic sinusitis: Secondary | ICD-10-CM

## 2022-10-18 DIAGNOSIS — N3941 Urge incontinence: Secondary | ICD-10-CM

## 2022-10-30 ENCOUNTER — Other Ambulatory Visit: Payer: Self-pay

## 2022-10-30 DIAGNOSIS — R4189 Other symptoms and signs involving cognitive functions and awareness: Secondary | ICD-10-CM

## 2022-10-30 DIAGNOSIS — M05741 Rheumatoid arthritis with rheumatoid factor of right hand without organ or systems involvement: Secondary | ICD-10-CM

## 2022-10-30 DIAGNOSIS — I10 Essential (primary) hypertension: Secondary | ICD-10-CM

## 2022-10-30 NOTE — Telephone Encounter (Signed)
Copy of Caregiver forms that were faxed to [place] on 09/21/22 are placed in red basket for reference for home health order

## 2022-10-30 NOTE — Telephone Encounter (Signed)
Patient stated they are requesting    "he can make an order for her so we can go directly to a home health company. They are [facility] in [place]. They told me to have order faxed over and they would be able to help [place]. If so the fax # is [phone] "   Medication issue has been resolved

## 2022-10-30 NOTE — Telephone Encounter (Signed)
Referral to social work placed for pt and family member to get assistance with cognitive decline and care giving resources

## 2022-11-04 ENCOUNTER — Other Ambulatory Visit: Payer: Medicare Other | Admitting: Licensed Clinical Social Worker

## 2022-11-04 NOTE — Patient Instructions (Signed)
Visit Information  Ms. Mehan 's family was given information about Medicaid Managed Care team care coordination services and verbally consented to engagement with the [facility] Managed Care team.   Following is a copy of your plan of care:  Care Plan : LCSW Plan of Care  Updates made by Gustavus Bryant, LCSW since 11/04/2022 12:00 AM     Problem: Cognitive Function (Wellness)      Goal: Cognitive Function Enhanced   Start Date: 11/04/2022  Note:   Priority: High  Timeframe:  Long-Range Goal Priority:  High Start Date:  11/04/22       Expected End Date:  ongoing                     Follow Up Date--11/23/22 at 2:45 pm  Current barriers:   Level of care concerns and need for in home support Physical and mental health concerns  Need for Financial Assistance and connection to available community resources Need for education on PCS process  Clinical Goals: Patient and grandchildren will work with agencies/resources/PCP/[facility] LCSW to address needs related to gaining in home support to help promote the safety and health of patient.  Patient Goals/Self-Care Activities: Over the next 30 days Call PCS and schedule initial assessment Call PCP office to gain PT/Home Health referral if interested in those services       24- Hour Availability:    [facility]  [address] [place], [place] [phone] Crisis [phone]   [facility] [phone]  [place] [facility]  [phone]    [facility]  [phone] (after hours)   Therapeutic Alternative/Mobile Crisis   [phone]   [place] National Suicide Hotline  724-229-4586 [place]) [place] 062   Call [phone] for mental health emergencies   [facility]  [phone]);  [place] and [employer]  [phone]); [place], [place], [place], [place], [place], [place], [place]    [facility] for  [facility] Residents For 24/7 walk-up access to mental health services for [facility] children (4+), adolescents and adults, please visit the [facility] located at [address] in [place], [place].  *[facility] also provides comprehensive outpatient behavioral health services in a variety of locations around the [place].  Connect With [place] [address] [place], [place] [address] HelpLine: [phone] or 1-770-440-5723  Get Directions  Find Help 24/7 By Phone Call our 24-hour HelpLine at [phone] or 7317190055 for immediate assistance for mental health and substance abuse issues.  Walk-In Help [place]: [facility] (Ages 4 and Up) [place]: Emergency Dept., [facility] Additional Resources National Hopeline Network: 1-800-SUICIDE The National Suicide Prevention Lifeline: 1-800-273-TALK      Dickie La, BSW, MSW, LCSW Managed Medicaid LCSW [facility]  [facility] [email or web address] Phone: [phone]

## 2022-11-04 NOTE — Patient Outreach (Signed)
Medicaid Managed Care Social Work Note  11/04/2022 Name:  Kristy Oliver MRN:  [id number] DOB:  1945-08-27  Kristy Oliver is an 77 y.o. year old female who is a primary patient of Saguier, Ramon Dredge, [place].  The [facility] Managed Care Coordination team was consulted for assistance with:  Level of Care Concerns  Kristy Oliver's granddaughter was given information about Medicaid Managed Care Coordination team services today. Kristy Oliver granddaughter reports that patient has issues answering the phone due to cognitive/physical decline. Primary Caregiver agreed to services and verbal consent obtained.  Engaged with patient  for by telephone forinitial visit in response to referral for case management and/or care coordination services.   Assessments/Interventions:  Review of past medical history, allergies, medications, health status, including review of consultants reports, laboratory and other test data, was performed as part of comprehensive evaluation and provision of chronic care management services.  SDOH: (Social Determinant of Health) assessments and interventions performed: SDOH Interventions    Flowsheet Row Patient Outreach Telephone from 11/04/2022 in [place] POPULATION HEALTH DEPARTMENT  SDOH Interventions   Transportation Interventions Intervention Not Indicated  Stress Interventions Offered [employer] Resources, Provide Counseling  [Per Granddaughter]       Advanced Directives Status:  Not addressed in this encounter.  Care Plan                 Allergies  Allergen Reactions   Clindamycin Hives, Rash and Swelling   Metrizamide     unknown   Motrin [Ibuprofen] Other (See Comments)    blackout States she is only allergic to 800 mg formulation   Iodinated Contrast Media Rash    unknown unknown   Milk-Related Compounds Diarrhea   Shrimp [Shellfish Allergy] Hives, Swelling and Rash    Medications Reviewed Today     Reviewed by Kristy Oliver, CMA  (Certified Medical Assistant) on 09/21/22 at [phone]  Med List Status: <None>   Medication Order Taking? Sig Documenting Provider Last Dose Status Informant  0.9 %  sodium chloride infusion [id number]   Iva Boop, MD  Active   albuterol (VENTOLIN HFA) 108 (90 Base) MCG/ACT inhaler [id number]  INHALE 2 PUFFS INTO THE LUNGS BY MOUTH EVERY 6 HOURS AS NEEDED Saguier, Kateri Mc  Active   alendronate (FOSAMAX) 70 MG tablet [id number]  Take 1 tablet (70 mg total) by mouth once a week. Take with full glass of water on empty stomach Saguier, Kateri Mc  Active   amLODipine (NORVASC) 10 MG tablet [id number]  TAKE 1 TABLET BY MOUTH DAILY *MUST HAVE OFFICE VISIT FOR REFILLS* Saguier, Kateri Mc  Active   atorvastatin (LIPITOR) 40 MG tablet [id number]  TAKE 1 TABLET (40MG ) BY MOUTH DAILY *MUST HAVE OFFICE VISIT FOR REFILLS* Saguier, Kateri Mc  Active   budesonide-formoterol [facility]) 160-4.5 MCG/ACT inhaler [id number]  Inhale 2 puffs into the lungs 2 (two) times daily. Saguier, Ramon Dredge, PA-C  Active   carvedilol (COREG) 6.25 MG tablet [id number]  TAKE ONE (1) TABLET BY MOUTH TWICE DAILY WITH A MEAL Saguier, Kateri Mc  Active   cetirizine (ZYRTEC) 10 MG tablet [id number] No TAKE 1 TABLET BY MOUTH ONCE DAILY Newlin, Enobong, MD Taking Active   diclofenac Sodium (VOLTAREN) 1 % GEL [id number]  APPLY 4 GRAMS TOPICALLY 4 (FOUR) TIMES DAILY. Saguier, Ramon Dredge, PA-C  Active   donepezil (ARICEPT) 5 MG tablet [id number]  Take 1 tablet (5 mg total) by mouth at bedtime. Saguier, Ramon Dredge, PA-C  Active   doxycycline (VIBRA-TABS) 100 MG tablet [id number] No Take  1 tablet (100 mg total) by mouth 2 (two) times daily.  Patient not taking: Reported on 06/29/2019   Vivi Barrack, DPM Not Taking Active   fluticasone [facility]) 50 MCG/ACT nasal spray [id number]  Place 2 sprays into both nostrils daily. Saguier, Ramon Dredge, PA-C  Active   folic acid (FOLVITE) 400 MCG tablet [id number]  Take 1 tablet (400 mcg total) by mouth daily.  Saguier, Ramon Dredge, PA-C  Active   leflunomide (ARAVA) 10 MG tablet [id number] No Take 10 mg by mouth daily. Isabelle Course, PA-C Taking Active   methotrexate (RHEUMATREX) 2.5 MG tablet [id number] No Take 3 tablets by mouth weekly. [provider] Taking Active   mirtazapine (REMERON) 30 MG tablet [id number]  Take 1 tablet (30 mg total) by mouth at bedtime. Saguier, Ramon Dredge, PA-C  Active   mupirocin ointment (BACTROBAN) 2 % [id number] No Apply 1 application topically 2 (two) times daily. Vivi Barrack, DPM Taking Active   mupirocin ointment (BACTROBAN) 2 % [id number]  Apply 1 Application topically 2 (two) times daily. Saguier, Ramon Dredge, PA-C  Active   oxybutynin (DITROPAN) 5 MG tablet [id number]  Take 1 tablet (5 mg total) by mouth 2 (two) times daily. Saguier, Ramon Dredge, PA-C  Active   predniSONE (DELTASONE) 20 MG tablet [id number] No Take 1 tablet (20 mg total) by mouth daily with breakfast. Hoy Register, MD Taking Active   tiZANidine (ZANAFLEX) 4 MG tablet [id number] No TAKE 1 TABLET BY MOUTH EVERY 12 HOURS AS NEEDED FOR MUSCLE SPASM Hoy Register, MD Taking Active   traMADol (ULTRAM) 50 MG tablet [id number] No Take 1 tablet (50 mg total) by mouth at bedtime as needed. Hoy Register, MD Taking Active   triamcinolone cream (KENALOG) 0.1 % [id number]  APPLY 1 APPLICATION TOPICALLY TWICE DAILY Saguier, Ramon Dredge, [place]  Active             Patient Active Problem List   Diagnosis Date Noted   Impaired mobility and activities of daily living 02/17/2019   Closed fracture of left scapula 04/11/2018   Fracture of unspecified part of neck of left femur, initial encounter for closed fracture (HCC) 04/11/2018   Hx of adenomatous colonic polyps 12/29/2016   Helicobacter pylori gastritis 12/13/2016   Left hand pain 09/22/2016   History of asthma 07/16/2016   History of depression 07/16/2016   Compression fracture of body of thoracic vertebra (HCC) 07/16/2016   Osteoporosis 07/07/2016   Positive  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 06/29/2016   Moderate asthma without complication 05/11/2016   High risk medication use 05/08/2016   History of left shoulder replacement 05/08/2016   Back pain 02/24/2016   Tinea pedis 09/23/2015   Depression 09/23/2015   Hyperlipidemia 07/04/2015   Chronic sinusitis 06/27/2015   HTN (hypertension) 10/12/2013   Smoker 10/12/2013   Bloating symptom 10/12/2013   RA (rheumatoid arthritis) (HCC) 06/30/2013   Tobacco use 06/30/2013   Gout 03/09/2013   Poor dentition 11/17/2012    Care Plan : LCSW Plan of Care  Updates made by Kristy Bryant, LCSW since 11/04/2022 12:00 AM     Problem: Cognitive Function (Wellness)      Goal: Cognitive Function Enhanced   Start Date: 11/04/2022  Note:   Priority: High  Timeframe:  Long-Range Goal Priority:  High Start Date:  11/04/22       Expected End Date:  ongoing                     Follow Up Date--11/23/22 at 2:45 pm  Current  barriers:   Level of care concerns and need for in home support Physical and mental health concerns  Need for Financial Assistance and connection to available community resources Need for education on PCS process  Clinical Goals: Patient and grandchildren will work with agencies/resources/PCP/[facility] LCSW to address needs related to gaining in home support to help promote the safety and health of patient.  Clinical Interventions:  Inter-disciplinary care team collaboration (see longitudinal plan of care) Granddaughter provided patient history. Patient has presented a cognitive and physical decline lately and family wish to increase the care in the home. Resource education provided.  Assessment of needs, progress, barriers , and agencies contacted: [facility]. Family report that patient's PCP has completed PCS paperwork twice already but she has been unable to navigate the health system in order to get patient her needs met. PCS call made together but we were unable to reach anyone so a  detailed message was left. Granddaughter wrote PCS number down.  Advised patient's caregivers to answer all calls from care providers and to keep phone nearby. Advised parents to contact 911 or 988 if a crisis occurs.  Clinical interventions provided:Solution-Focused Strategies, Active listening / Reflection utilized , Problem Solving /Task Center ,  Patient's main support network includes grandchildren.  Patient Goals/Self-Care Activities: Over the next 30 days Call PCS and schedule initial assessment Call PCP office to gain PT/Home Health referral if interested in those services     Follow up:  Patient agrees to Care Plan and Follow-up.  Plan: The Managed Medicaid care management team will reach out to the patient again over the next 30 days.  Kristy Oliver, BSW, MSW, [employer] Managed Medicaid LCSW [facility]  [facility] [email or web address] Phone: [phone]

## 2022-11-11 ENCOUNTER — Other Ambulatory Visit: Payer: Self-pay | Admitting: Medical

## 2022-11-11 DIAGNOSIS — M818 Other osteoporosis without current pathological fracture: Secondary | ICD-10-CM

## 2022-11-21 NOTE — Telephone Encounter (Signed)
Pt has been referred to orthopedic surgery for the infection... does she still need an ED follow up?

## 2022-11-23 ENCOUNTER — Other Ambulatory Visit: Payer: Medicare Other | Admitting: Licensed Clinical Social Worker

## 2022-11-23 NOTE — Patient Outreach (Signed)
  Medicaid Managed Care   Unsuccessful Attempt Note   11/23/2022 Name: Kristy Oliver MRN: [id number] DOB: 1945/12/07  Referred by: Esperanza Richters, PA-C Reason for referral : No chief complaint on file.   An unsuccessful telephone outreach was attempted today. The patient was referred to the case management team for assistance with care management and care coordination.    Follow Up Plan: A HIPAA compliant phone message was left for the patient providing contact information and requesting a return call.   Dickie La, BSW, MSW, [employer] Managed Medicaid LCSW [facility]  [facility] [email or web address] Phone: [phone]

## 2022-11-23 NOTE — Patient Instructions (Signed)
Kristy Oliver ,   The [facility] Managed Care Team is available to provide assistance to you with your healthcare needs at no cost and as a benefit of your [facility] Health plan. I'm sorry I was unable to reach you today for our scheduled appointment. Our care guide will call you to reschedule our telephone appointment. Please call me at the number below. I am available to be of assistance to you regarding your healthcare needs. .   Thank you,    Kristy Oliver, Kristy Oliver, Kristy Oliver, Kristy Oliver Managed Medicaid Kristy Oliver [facility]  [address] [name] Phone: [phone]

## 2022-12-02 ENCOUNTER — Ambulatory Visit: Payer: Medicare Other | Admitting: Medical

## 2022-12-10 ENCOUNTER — Other Ambulatory Visit: Payer: Medicare Other | Admitting: Licensed Clinical Social Worker

## 2022-12-10 NOTE — Patient Instructions (Signed)
Gary Fleet ,   The [facility] Managed Care Team is available to provide assistance to you with your healthcare needs at no cost and as a benefit of your [facility] Health plan. I'm sorry I was unable to reach you today for our scheduled appointment. Our care guide will call you to reschedule our telephone appointment. Please call me at the number below. I am available to be of assistance to you regarding your healthcare needs. .   Thank you,    Dickie La, BSW, MSW, LCSW Managed Medicaid LCSW [facility]  [address] [email or web address] Phone: [phone]

## 2022-12-10 NOTE — Patient Outreach (Signed)
  Medicaid Managed Care   Unsuccessful Attempt Note   12/10/2022 Name: Bayan Hedstrom MRN: [id number] DOB: 1945/12/10  Referred by: Esperanza Richters, PA-C Reason for referral : No chief complaint on file.   A second unsuccessful telephone outreach was attempted today. The patient was referred to the case management team for assistance with care management and care coordination.    Follow Up Plan: The Managed Medicaid care management team will reach out to the patient again over the next 30 days.   Dickie La, BSW, MSW, [employer] Managed Medicaid LCSW [facility]  [facility] [email or web address] Phone: [phone]

## 2022-12-31 ENCOUNTER — Other Ambulatory Visit: Payer: Medicare Other | Admitting: Licensed Clinical Social Worker

## 2022-12-31 ENCOUNTER — Ambulatory Visit: Payer: Medicare Other | Admitting: Licensed Clinical Social Worker

## 2022-12-31 NOTE — Patient Instructions (Signed)
Visit Information  Kristy Oliver was given information about Medicaid Managed Care team care coordination services and verbally consented to engagement with the [facility] Managed Care team.   Following is a copy of your plan of care:  Care Plan : LCSW Plan of Care  Updates made by Gustavus Bryant, LCSW since 12/31/2022 12:00 AM     Problem: Cognitive Function (Wellness)      Goal: Cognitive Function Enhanced   Start Date: 11/04/2022  Note:   Priority: High  Timeframe:  Long-Range Goal Priority:  High Start Date:  11/04/22       Expected End Date:  ongoing                     Follow Up Date--01/15/23 at 9:15 am  Current barriers:   Level of care concerns and need for in home support Physical and mental health concerns  Need for Financial Assistance and connection to available community resources Need for education on PCS process  Clinical Goals: Patient and grandchildren will work with agencies/resources/PCP/[facility] LCSW to address needs related to gaining in home support to help promote the safety and health of patient.  Patient Goals/Self-Care Activities: Over the next 30 days Call PCS and schedule initial assessment Call PCP office to gain PT/Home Health referral if interested in those services       24- Hour Availability:    [facility]  [address] [place], [place] [phone] Crisis [phone]   [facility] [phone]  [place] [facility]  [phone]    [facility]  [phone] (after hours)   Therapeutic Alternative/Mobile Crisis   [phone]   [place] National Suicide Hotline  (435)511-2943 Len Child[place]) [place] [address]   Call (980)536-8221 for mental health emergencies   [facility]  [phone]);  [place] and [employer]  [phone]); [place], [place], [place], [place], [place], [place], [place]    [place] [facility] for [facility] Residents For 24/7 walk-up access to mental health services for [facility] children (4+), adolescents and adults, please visit the [facility] located at [address] in [place], [place].  *[facility] also provides comprehensive outpatient behavioral health services in a variety of locations around the [place].  Connect With [place] [address] [place], [place] [address] HelpLine: 413-099-5103 or 1-949-731-8790  Get Directions  Find Help 24/7 By Phone Call our 24-hour HelpLine at 872-853-3577 or 272-215-0025 for immediate assistance for mental health and substance abuse issues.  Walk-In Help [place]: [facility] (Ages 4 and Up) [place]: Emergency Dept., [facility] Additional Resources National Hopeline Network: 1-800-SUICIDE The National Suicide Prevention Lifeline: 1-800-273-TALK     Dickie La, BSW, MSW, LCSW Managed Medicaid LCSW [facility]  [facility] [email or web address] Phone: [phone]

## 2022-12-31 NOTE — Patient Outreach (Signed)
Medicaid Managed Care Social Work Note  12/31/2022 Name:  Kristy Oliver MRN:  [id number] DOB:  06/20/1945  Kristy Oliver is an 77 y.o. year old female who is a primary patient of Saguier, Ramon Dredge, [place].  The Medicaid Managed Care Coordination team was consulted for assistance with:  Community Resources   Kristy Oliver was given information about Medicaid Managed Care Coordination team services today. Kristy Oliver Patient and Primary Caregiver agreed to services and verbal consent obtained.  Engaged with patient  for by telephone forfollow up visit in response to referral for case management and/or care coordination services.   Assessments/Interventions:  Review of past medical history, allergies, medications, health status, including review of consultants reports, laboratory and other test data, was performed as part of comprehensive evaluation and provision of chronic care management services.  SDOH: (Social Determinant of Health) assessments and interventions performed: SDOH Interventions    Flowsheet Row Patient Outreach Telephone from 12/31/2022 in [place] POPULATION HEALTH DEPARTMENT Patient Outreach Telephone from 11/04/2022 in  POPULATION HEALTH DEPARTMENT  SDOH Interventions    Transportation Interventions -- Intervention Not Indicated  Stress Interventions Offered [employer] Resources, Provide Counseling  [Ongoing health concerns and need for in home aide services] Offered [employer], Provide Counseling  [Per Granddaughter]       Advanced Directives Status:  See Care Plan for related entries.  Care Plan                 Allergies  Allergen Reactions   Clindamycin Hives, Rash and Swelling   Metrizamide     unknown   Motrin [Ibuprofen] Other (See Comments)    blackout States she is only allergic to 800 mg formulation   Iodinated Contrast Media Rash    unknown unknown   Milk-Related Compounds Diarrhea   Shrimp [Shellfish  Allergy] Hives, Swelling and Rash    Medications Reviewed Today   Medications were not reviewed in this encounter     Patient Active Problem List   Diagnosis Date Noted   Impaired mobility and activities of daily living 02/17/2019   Closed fracture of left scapula 04/11/2018   Fracture of unspecified part of neck of left femur, initial encounter for closed fracture (HCC) 04/11/2018   Hx of adenomatous colonic polyps 12/29/2016   Helicobacter pylori gastritis 12/13/2016   Left hand pain 09/22/2016   History of asthma 07/16/2016   History of depression 07/16/2016   Compression fracture of body of thoracic vertebra (HCC) 07/16/2016   Osteoporosis 07/07/2016   Positive 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 06/29/2016   Moderate asthma without complication 05/11/2016   High risk medication use 05/08/2016   History of left shoulder replacement 05/08/2016   Back pain 02/24/2016   Tinea pedis 09/23/2015   Depression 09/23/2015   Hyperlipidemia 07/04/2015   Chronic sinusitis 06/27/2015   HTN (hypertension) 10/12/2013   Smoker 10/12/2013   Bloating symptom 10/12/2013   RA (rheumatoid arthritis) (HCC) 06/30/2013   Tobacco use 06/30/2013   Gout 03/09/2013   Poor dentition 11/17/2012    Conditions to be addressed/monitored per PCP order:   Need for PCS  Care Plan : LCSW Plan of Care  Updates made by Kristy Bryant, LCSW since 12/31/2022 12:00 AM     Problem: Cognitive Function (Wellness)      Goal: Cognitive Function Enhanced   Start Date: 11/04/2022  Note:   Priority: High  Timeframe:  Long-Range Goal Priority:  High Start Date:  11/04/22       Expected End  Date:  ongoing                     Follow Up Date--01/15/23 at 9:15 am  Current barriers:   Level of care concerns and need for in home support Physical and mental health concerns  Need for Financial Assistance and connection to available community resources Need for education on PCS process  Clinical Goals: Patient and  grandchildren will work with agencies/resources/PCP/[facility] LCSW to address needs related to gaining in home support to help promote the safety and health of patient.  Clinical Interventions:  Inter-disciplinary care team collaboration (see longitudinal plan of care) Granddaughter provided patient history. Patient has presented a cognitive and physical decline lately and family wish to increase the care in the home. Resource education provided.  Assessment of needs, progress, barriers , and agencies contacted: [facility]. Family report that patient's PCP has completed PCS paperwork twice already but she has been unable to navigate the health system in order to get patient her needs met. PCS call made together but we were unable to reach anyone so a detailed message was left. Granddaughter wrote PCS number down.  Advised patient's caregivers to answer all calls from care providers and to keep phone nearby. Advised parents to contact 911 or 988 if a crisis occurs.  Clinical interventions provided:Solution-Focused Strategies, Active listening / Reflection utilized , Problem Solving [facility] ,  Brief Advance Directive Education provided Patient's main support network includes grandchildren. 12/31/22- [facility] LCSW spoke to both patient and granddaughter. They have not heard from [facility] regarding patient's PCS application. Granddaughter contacted program on her personal phone and was able to be connected with a caseworker Kristy Oliver who will return her call to follow up on patient's PCS request. Patient is stable but continues to struggle with ongoing health complications so family is eager to gain in home support. [facility] LCSW provided education on PCS enrollment and family is familiar with this process as patient has had it in the past. Family was advised to keep PCP updated on this process. [facility] LCSW will follow up in two weeks to ensure social work need was met.   Patient Goals/Self-Care  Activities: Over the next 30 days Call PCS and schedule initial assessment Call PCP office to gain PT/Home Health referral if interested in those services    Follow up:  Patient agrees to Care Plan and Follow-up.  Plan: The Managed Medicaid care management team will reach out to the patient again over the next 30 days. Follow up date is on 01/15/23 at 9:15 am.   Dickie La, BSW, MSW, LCSW Managed Medicaid LCSW [facility]  [facility] [email or web address] Phone: [phone]

## 2023-01-07 ENCOUNTER — Ambulatory Visit: Payer: Medicare Other | Admitting: Medical

## 2023-01-14 ENCOUNTER — Ambulatory Visit: Payer: Medicare Other | Admitting: Medical

## 2023-01-15 ENCOUNTER — Other Ambulatory Visit: Payer: Medicare Other | Admitting: Licensed Clinical Social Worker

## 2023-01-15 NOTE — Patient Outreach (Signed)
Medicaid Managed Care Social Work Note  01/15/2023 Name:  Kristy Oliver MRN:  [id number] DOB:  16-Mar-1946  Imaan Ripper is an 77 y.o. year old female who is a primary patient of S[place]ier, [place]on [place], [place].  The Medicaid Managed Care Coordination team was consulted for assistance with:  Community Resources   Ms. Swierk was given information about Medicaid Managed Care Coordination team services today. Gary Fleet Primary Caregiver agreed to services and verbal consent obtained.  Engaged with patient  for by telephone forfollow up visit in response to referral for case management and/or care coordination services.   Assessments/Interventions:  Review of past medical history, allergies, medications, health status, including review of consultants reports, laboratory and other test data, was performed as part of comprehensive evaluation and provision of chronic care management services.  SDOH: (Social Determinant of Health) assessments and interventions performed: SDOH Interventions    Flowsheet Row Patient Outreach Telephone from 01/15/2023 in [place] POPULATION HEALTH DEPARTMENT Patient Outreach Telephone from 12/31/2022 in [place] POPULATION HEALTH DEPARTMENT Patient Outreach Telephone from 11/04/2022 in [place] POPULATION HEALTH DEPARTMENT  SDOH Interventions     Transportation Interventions -- -- Intervention Not Indicated  Stress Interventions Offered [employer] Resources, Provide Counseling Offered [employer] Resources, Provide Counseling  [Ongoing health concerns and need for in home aide services] Offered [employer] Resources, Provide Counseling  [Per Granddaughter]       Advanced Directives Status:  See Care Plan for related entries.  Care Plan                 Allergies  Allergen Reactions   Clindamycin Hives, Rash and Swelling   Metrizamide     unknown   Motrin [Ibuprofen] Other (See Comments)    blackout States she is only allergic to  800 mg formulation   Iodinated Contrast Media Rash    unknown unknown   Milk-Related Compounds Diarrhea   Shrimp [Shellfish Allergy] Hives, Swelling and Rash    Medications Reviewed Today   Medications were not reviewed in this encounter     Patient Active Problem List   Diagnosis Date Noted   Impaired mobility and activities of daily living 02/17/2019   Closed fracture of left scapula 04/11/2018   Fracture of unspecified part of neck of left femur, initial encounter for closed fracture (HCC) 04/11/2018   Hx of adenomatous colonic polyps 12/29/2016   Helicobacter pylori gastritis 12/13/2016   Left hand pain 09/22/2016   History of asthma 07/16/2016   History of depression 07/16/2016   Compression fracture of body of thoracic vertebra (HCC) 07/16/2016   Osteoporosis 07/07/2016   Positive 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 06/29/2016   Moderate asthma without complication 05/11/2016   High risk medication use 05/08/2016   History of left shoulder replacement 05/08/2016   Back pain 02/24/2016   Tinea pedis 09/23/2015   Depression 09/23/2015   Hyperlipidemia 07/04/2015   Chronic sinusitis 06/27/2015   HTN (hypertension) 10/12/2013   Smoker 10/12/2013   Bloating symptom 10/12/2013   RA (rheumatoid arthritis) (HCC) 06/30/2013   Tobacco use 06/30/2013   Gout 03/09/2013   Poor dentition 11/17/2012    Care Plan : LCSW Plan of Care  Updates made by Gustavus Bryant, LCSW since 01/15/2023 12:00 AM     Problem: Cognitive Function (Wellness)      Goal: Cognitive Function Enhanced   Start Date: 11/04/2022  Note:   Priority: High  Timeframe:  Long-Range Goal Priority:  High Start Date:  11/04/22  Expected End Date:  ongoing                     Goal ended on 01/15/23- Resource and contact information emailed to family successfully  Current barriers:   Level of care concerns and need for in home support Physical and mental health concerns  Need for Financial Assistance and  connection to available community resources Need for education on PCS process  Clinical Goals: Patient and grandchildren will work with agencies/resources/PCP/[facility] LCSW to address needs related to gaining in home support to help promote the safety and health of patient.  Clinical Interventions:  Inter-disciplinary care team collaboration (see longitudinal plan of care) Granddaughter provided patient history. Patient has presented a cognitive and physical decline lately and family wish to increase the care in the home. Resource education provided.  Assessment of needs, progress, barriers , and agencies contacted: [facility]. Family report that patient's PCP has completed PCS paperwork twice already but she has been unable to navigate the health system in order to get patient her needs met. PCS call made together but we were unable to reach anyone so a detailed message was left. Granddaughter wrote PCS number down.  Brief Advance Directive Education provided Advised patient's caregivers to answer all calls from care providers and to keep phone nearby. Advised parents to contact 911 or [phone] if a crisis occurs.  Clinical interventions provided:Solution-Focused Strategies, Active listening / Reflection utilized , Problem Solving /Task Center ,  Patient's main support network includes grandchildren. 12/31/22- [facility] LCSW spoke to both patient and granddaughter. They have not heard from [facility] regarding patient's PCS application. Granddaughter contacted program on her personal phone and was able to be connected with a caseworker Toma Copier who will return her call to follow up on patient's PCS request. Patient is stable but continues to struggle with ongoing health complications so family is eager to gain in home support. [facility] LCSW provided education on PCS enrollment and family is familiar with this process as patient has had it in the past. Family was advised to keep PCP updated on this process.  [facility] LCSW will follow up in two weeks to ensure social work need was met. Update- Family report no further process with the PCS. [facility] LCSW sent family an email with the following information: To request PCS, contact [phone] or [phone] phone and ask for a PCS assessment and fax to [phone]. Family will need to contact [facility] to coordinate care directly and patient admits it has been hard to reach anyone there so family was advised to set beside a few hours to call about starting PCS before patient's upcoming PCP visit. [facility] LCSW will close case as resources and instructions have been provided successfully.   Patient Goals/Self-Care Activities: Over the next 30 days Call PCS and schedule initial assessment Call PCP office to gain PT/Home Health referral if interested in those services     Follow up:  Patient requests no follow-up at this time.  Plan: The Managed Medicaid care management team is available to follow up with the patient after provider conversation with the patient regarding recommendation for care management engagement and subsequent re-referral to the care management team.   Dickie La, BSW, MSW, LCSW Managed Medicaid LCSW [facility]  [facility] [email or web address] Phone: [phone]

## 2023-01-15 NOTE — Patient Instructions (Signed)
Visit Information  Kristy Oliver's family was given information about Medicaid Managed Care team care coordination services and verbally consented to engagement with the [facility] Managed Care team.   Following is a copy of your plan of care:  Care Plan : LCSW Plan of Care  Updates made by Kristy Bryant, LCSW since 01/15/2023 12:00 AM     Problem: Cognitive Function (Wellness)      Goal: Cognitive Function Enhanced   Start Date: 11/04/2022  Note:   Priority: High  Timeframe:  Long-Range Goal Priority:  High Start Date:  11/04/22       Expected End Date:  ongoing                     Goal ended on 01/15/23- Resource and contact information emailed to family successfully  Current barriers:   Level of care concerns and need for in home support Physical and mental health concerns  Need for Financial Assistance and connection to available community resources Need for education on PCS process  Clinical Goals: Patient and grandchildren will work with agencies/resources/PCP/[facility] LCSW to address needs related to gaining in home support to help promote the safety and health of patient.  [facility] LCSW sent family an email with the following information: To request PCS, contact [phone] or [phone] phone and ask for a PCS assessment and fax to [phone]. Family will need to contact [facility] to coordinate care directly and patient admits it has been hard to reach anyone there so family was advised to set beside a few hours to call about starting PCS before patient's upcoming PCP visit. [facility] LCSW will close case as resources and instructions have been provided successfully.   Patient Goals/Self-Care Activities: Over the next 30 days Call PCS and schedule initial assessment Call PCP office to gain PT/Home Health referral if interested in those services    Kristy Oliver, Kristy Oliver, Kristy Oliver, Kristy Oliver Managed Medicaid LCSW [facility]  [facility] [email or web address] Phone: [phone]

## 2023-01-21 ENCOUNTER — Ambulatory Visit: Payer: Medicare Other | Admitting: Medical

## 2023-02-11 ENCOUNTER — Other Ambulatory Visit: Payer: Self-pay | Admitting: Medical

## 2023-02-11 DIAGNOSIS — R630 Anorexia: Secondary | ICD-10-CM

## 2023-02-11 DIAGNOSIS — I10 Essential (primary) hypertension: Secondary | ICD-10-CM

## 2023-02-17 ENCOUNTER — Other Ambulatory Visit: Payer: Self-pay | Admitting: Medical

## 2023-02-17 DIAGNOSIS — I10 Essential (primary) hypertension: Secondary | ICD-10-CM

## 2023-02-23 ENCOUNTER — Other Ambulatory Visit: Payer: Self-pay | Admitting: Medical

## 2023-02-23 DIAGNOSIS — I10 Essential (primary) hypertension: Secondary | ICD-10-CM

## 2023-03-01 ENCOUNTER — Other Ambulatory Visit: Payer: Self-pay | Admitting: Medical

## 2023-03-01 DIAGNOSIS — I10 Essential (primary) hypertension: Secondary | ICD-10-CM

## 2023-03-05 ENCOUNTER — Telehealth: Payer: Self-pay | Admitting: Medical

## 2023-03-05 DIAGNOSIS — I10 Essential (primary) hypertension: Secondary | ICD-10-CM

## 2023-03-05 MED ORDER — AMLODIPINE BESYLATE 10 MG PO TABS: ORAL_TABLET | ORAL | 10 refills | Status: DC

## 2023-03-05 NOTE — Telephone Encounter (Signed)
Prescription Request  03/05/2023  Is this a "Controlled Substance" medicine? No  LOV: 09/21/2022  What is the name of the medication or equipment?  amLODipine (NORVASC) 10 MG tablet   Have you contacted your pharmacy to request a refill? Yes   Which pharmacy would you like this sent to?    [facility] - [address], [place] - [address] [place] [address] Phone: [phone] Fax: [phone]    Patient notified that their request is being sent to the clinical staff for review and that they should receive a response within 2 business days.   Please advise at [facility] [phone]

## 2023-03-05 NOTE — Telephone Encounter (Signed)
Rx sent 

## 2023-03-05 NOTE — Addendum Note (Signed)
Addended by: Maximino Sarin on: 03/05/2023 04:40 PM   Modules accepted: Orders

## 2023-03-09 ENCOUNTER — Other Ambulatory Visit: Payer: Self-pay | Admitting: Medical

## 2023-03-09 DIAGNOSIS — I10 Essential (primary) hypertension: Secondary | ICD-10-CM

## 2023-03-11 ENCOUNTER — Other Ambulatory Visit: Payer: Self-pay | Admitting: Medical

## 2023-03-11 DIAGNOSIS — R21 Rash and other nonspecific skin eruption: Secondary | ICD-10-CM

## 2023-04-09 ENCOUNTER — Other Ambulatory Visit: Payer: Self-pay | Admitting: Medical

## 2023-06-08 ENCOUNTER — Telehealth: Payer: Self-pay | Admitting: Medical

## 2023-06-08 NOTE — Telephone Encounter (Signed)
 Copied from CRM [id number]. Topic: General - Other >> Jun 08, 2023  4:20 PM Corin V wrote: Reason for CRM: Morrie Sheldon with [employer] is faxing a request for notes to the office. Please call if no fax is received by tomorrow afternoon.

## 2023-06-09 NOTE — Telephone Encounter (Signed)
Form received, sent to medical records.

## 2023-06-29 ENCOUNTER — Telehealth: Payer: Self-pay

## 2023-06-29 NOTE — Telephone Encounter (Unsigned)
 Copied from CRM [id number]. Topic: Clinical - Home Health Verbal Orders >> Jun 29, 2023 11:34 AM Laurier BROCKS wrote: Caller/Agency: [facility] Callback Number: [phone] Service Requested: Physical Therapy Frequency: 1 week 8 Any new concerns about the patient? No

## 2023-06-29 NOTE — Telephone Encounter (Signed)
 Called HH back lvm for Brain Adderation verbal  Order for PT. Copied from CRM [id number]. Topic: Clinical - Home Health Verbal Orders >> Jun 29, 2023 11:34 AM Laurier BROCKS wrote: Caller/Agency: [facility] Callback Number: [phone] Service Requested: Physical Therapy Frequency: 1 week 8 Any new concerns about the patient? No

## 2023-06-30 ENCOUNTER — Inpatient Hospital Stay: Payer: Medicare Other | Admitting: Medical

## 2023-07-01 ENCOUNTER — Telehealth: Payer: Self-pay

## 2023-07-01 NOTE — Telephone Encounter (Signed)
 Copied from CRM [id number]. Topic: Clinical - Home Health Verbal Orders >> Jul 01, 2023 12:28 PM Alfonso ORN wrote: Caller/Agency: [facility]/ Rosealee Rushing Number: [phone] Service Requested: Speech Therapy Frequency: once a week for 9 weeks  Any new concerns about the patient? No

## 2023-07-05 ENCOUNTER — Inpatient Hospital Stay: Payer: Medicare Other | Admitting: Medical

## 2023-07-05 NOTE — Telephone Encounter (Signed)
 Pt has a follow up on 07/08/23

## 2023-07-06 ENCOUNTER — Telehealth: Payer: Self-pay

## 2023-07-06 NOTE — Telephone Encounter (Signed)
Copied from CRM [id number]. Topic: Clinical - Home Health Verbal Orders >> Jul 06, 2023 10:12 AM Myrtice Lauth wrote: Caller/Agency: [facility] Priscille [place]idelberg Number: [phone] Service Requested: Speech Therapy Frequency: 1 week 9  following up on verbal orders.  Please call back with approval. Any new concerns about the patient? Yes

## 2023-07-08 ENCOUNTER — Ambulatory Visit: Payer: Medicare Other | Admitting: Medical

## 2023-07-08 ENCOUNTER — Other Ambulatory Visit: Payer: Self-pay

## 2023-07-08 VITALS — BP 138/66 | HR 60 | Resp 18 | Ht 60.0 in | Wt 130.0 lb

## 2023-07-08 DIAGNOSIS — R911 Solitary pulmonary nodule: Secondary | ICD-10-CM

## 2023-07-08 DIAGNOSIS — N2889 Other specified disorders of kidney and ureter: Secondary | ICD-10-CM

## 2023-07-08 DIAGNOSIS — D649 Anemia, unspecified: Secondary | ICD-10-CM | POA: Diagnosis not present

## 2023-07-08 DIAGNOSIS — N3941 Urge incontinence: Secondary | ICD-10-CM

## 2023-07-08 DIAGNOSIS — T193XXA Foreign body in uterus, initial encounter: Secondary | ICD-10-CM | POA: Diagnosis not present

## 2023-07-08 DIAGNOSIS — E875 Hyperkalemia: Secondary | ICD-10-CM | POA: Diagnosis not present

## 2023-07-08 DIAGNOSIS — M818 Other osteoporosis without current pathological fracture: Secondary | ICD-10-CM

## 2023-07-08 DIAGNOSIS — R93422 Abnormal radiologic findings on diagnostic imaging of left kidney: Secondary | ICD-10-CM

## 2023-07-08 DIAGNOSIS — N289 Disorder of kidney and ureter, unspecified: Secondary | ICD-10-CM

## 2023-07-08 DIAGNOSIS — L02519 Cutaneous abscess of unspecified hand: Secondary | ICD-10-CM | POA: Diagnosis not present

## 2023-07-08 DIAGNOSIS — R32 Unspecified urinary incontinence: Secondary | ICD-10-CM

## 2023-07-08 DIAGNOSIS — J328 Other chronic sinusitis: Secondary | ICD-10-CM

## 2023-07-08 DIAGNOSIS — R630 Anorexia: Secondary | ICD-10-CM

## 2023-07-08 LAB — COMPREHENSIVE METABOLIC PANEL
ALT: 29 U/L (ref 0–35)
AST: 51 U/L — ABNORMAL HIGH (ref 0–37)
Albumin: 3.5 g/dL (ref 3.5–5.2)
Alkaline Phosphatase: 366 U/L — ABNORMAL HIGH (ref 39–117)
BUN: 15 mg/dL (ref 6–23)
CO2: 25 meq/L (ref 19–32)
Calcium: 9 mg/dL (ref 8.4–10.5)
Chloride: 109 meq/L (ref 96–112)
Creatinine, Ser: 0.75 mg/dL (ref 0.40–1.20)
GFR: 78.04 mL/min (ref >60.00)
Glucose, Bld: 92 mg/dL (ref 70–99)
Potassium: 4 meq/L (ref 3.5–5.1)
Sodium: 139 meq/L (ref 135–145)
Total Bilirubin: 0.5 mg/dL (ref 0.2–1.2)
Total Protein: 8.7 g/dL — ABNORMAL HIGH (ref 6.0–8.3)

## 2023-07-08 LAB — CBC WITH DIFFERENTIAL/PLATELET
Basophils Absolute: 0.1 10*3/uL (ref 0.0–0.1)
Basophils Relative: 1.3 % (ref 0.0–3.0)
Eosinophils Absolute: 0.4 10*3/uL (ref 0.0–0.7)
Eosinophils Relative: 7.9 % — ABNORMAL HIGH (ref 0.0–5.0)
HCT: 38.2 % (ref 36.0–46.0)
Hemoglobin: 12.7 g/dL (ref 12.0–15.0)
Lymphocytes Relative: 27.2 % (ref 12.0–46.0)
Lymphs Abs: 1.3 10*3/uL (ref 0.7–4.0)
MCHC: 33.3 g/dL (ref 30.0–36.0)
MCV: 93.8 fL (ref 78.0–100.0)
Monocytes Absolute: 0.6 10*3/uL (ref 0.1–1.0)
Monocytes Relative: 12.7 % — ABNORMAL HIGH (ref 3.0–12.0)
Neutro Abs: 2.5 10*3/uL (ref 1.4–7.7)
Neutrophils Relative %: 50.9 % (ref 43.0–77.0)
Platelets: 160 10*3/uL (ref 150.0–400.0)
RBC: 4.08 Mil/uL (ref 3.87–5.11)
RDW: 14.9 % (ref 11.5–15.5)
WBC: 4.9 10*3/uL (ref 4.0–10.5)

## 2023-07-08 LAB — IBC + FERRITIN
Ferritin: 49.8 ng/mL (ref 10.0–291.0)
Iron: 85 ug/dL (ref 42–145)
Saturation Ratios: 23.4 % (ref 20.0–50.0)
TIBC: 364 ug/dL (ref 250.0–450.0)
Transferrin: 260 mg/dL (ref 212.0–360.0)

## 2023-07-08 MED ORDER — DONEPEZIL HCL 5 MG PO TABS: 5.0000 mg | ORAL_TABLET | Freq: Every day | ORAL | 10 refills | Status: DC

## 2023-07-08 MED ORDER — TROSPIUM CHLORIDE 20 MG PO TABS: 20.0000 mg | ORAL_TABLET | Freq: Two times a day (BID) | ORAL | 0 refills | Status: AC

## 2023-07-08 MED ORDER — FLUTICASONE PROPIONATE 50 MCG/ACT NA SUSP: 1.0000 | Freq: Every day | NASAL | 10 refills | Status: AC

## 2023-07-08 MED ORDER — ALLOPURINOL 100 MG PO TABS: 100.0000 mg | ORAL_TABLET | Freq: Every day | ORAL | 6 refills | Status: AC

## 2023-07-08 MED ORDER — ALENDRONATE SODIUM 70 MG PO TABS: 70.0000 mg | ORAL_TABLET | ORAL | 10 refills | Status: AC

## 2023-07-08 MED ORDER — AMLODIPINE BESYLATE 5 MG PO TABS
5.0000 mg | ORAL_TABLET | Freq: Every day | ORAL | 2 refills | Status: DC
Start: 2023-07-08 — End: 2023-08-25

## 2023-07-08 MED ORDER — ATORVASTATIN CALCIUM 40 MG PO TABS: ORAL_TABLET | ORAL | 10 refills | Status: AC

## 2023-07-08 MED ORDER — ALBUTEROL SULFATE HFA 108 (90 BASE) MCG/ACT IN AERS
1.0000 | INHALATION_SPRAY | Freq: Four times a day (QID) | RESPIRATORY_TRACT | 10 refills | Status: AC | PRN
Start: 2023-07-08 — End: ?

## 2023-07-08 MED ORDER — MIRTAZAPINE 30 MG PO TABS: 30.0000 mg | ORAL_TABLET | Freq: Every day | ORAL | 10 refills | Status: AC

## 2023-07-08 MED ORDER — CARVEDILOL 3.125 MG PO TABS: 3.1250 mg | ORAL_TABLET | Freq: Two times a day (BID) | ORAL | 3 refills | Status: DC

## 2023-07-08 MED ORDER — OMEPRAZOLE 20 MG PO CPDR: 20.0000 mg | DELAYED_RELEASE_CAPSULE | Freq: Every day | ORAL | 3 refills | Status: DC

## 2023-07-08 MED ORDER — OXYBUTYNIN CHLORIDE 5 MG PO TABS
5.0000 mg | ORAL_TABLET | Freq: Two times a day (BID) | ORAL | 10 refills | Status: AC
Start: 2023-07-08 — End: ?

## 2023-07-08 NOTE — Progress Notes (Addendum)
 Subjective:    Patient ID: Kristy Oliver, female    DOB: 1946/02/07, 78 y.o.   MRN: [id number]  HPI  Pt here for follow up post hospitalization during interim got request for speech therapy.   [facility] [place] Number: [phone] Service Requested: Speech Therapy Frequency: 1 week 9  following up on verbal orders.  Discussed the use of AI scribe software for clinical note transcription with the patient, who gave verbal consent to proceed.  History of Present Illness   Kristy Oliver is a 78 year old female who presents for a hospital follow-up after treatment for an abscess on the right hand.  She was hospitalized for approximately three weeks for an abscess on the right hand involving the tendon sheath, during which she underwent incision and drainage and received intravenous antibiotics, transitioning to oral Keflex four times a day for nine days. The stitches were removed yesterday, and the wound is well-healed.  During her hospitalization, a CT scan of the abdomen and pelvis revealed a chronically perforated Lippes loop uterine device positioned externally along the right adnexa. She experiences intermittent low-level abdominal pain.  A CT scan identified left renal lesions, and further evaluation with a renal ultrasound has been recommended. She reports no current symptoms related to these findings.  Incidental findings on a CT scan included lung nodules. She reports no current respiratory symptoms. long hx of smoking 50 or more years. more than a pack a day in past per pt. none currenlty.  Recent lab work showed elevated potassium levels and mild anemia. She was previously on a short course of potassium supplements due to low levels but is not currently taking them.  She has been receiving speech therapy once a week for eight weeks, although she reports no issues with speech or swallowing. She has completed two sessions so far, and there is some confusion  regarding the necessity of this therapy as she has not experienced any speech difficulties.  She has a significant smoking history of at least fifty years as a chain smoker.       Also history of urinary incontinence. Rx ditropan for this.  Review of Systems  Constitutional:  Negative for chills, fatigue and fever.  Respiratory:  Negative for cough, chest tightness, shortness of breath and wheezing.   Cardiovascular:  Negative for chest pain and palpitations.  Gastrointestinal:  Negative for abdominal pain, blood in stool, diarrhea and rectal pain.       None presently.  Musculoskeletal:  Negative for back pain.  Skin:  Negative for rash.  Neurological:  Negative for dizziness, syncope, weakness and light-headedness.  Psychiatric/Behavioral:  Negative for behavioral problems, decreased concentration and dysphoric mood. The patient is not nervous/anxious.     Past Medical History:  Diagnosis Date   Allergy    Arthritis    Asthma    as young person, none currently   Facial cellulitis 11/17/2012   Gout    Helicobacter pylori gastritis 12/13/2016   HTN (hypertension), malignant 11/17/2012   Hx of adenomatous colonic polyps 12/29/2016   Hyperlipidemia    medicine currently   Hypertension    Osteoporosis    Renal disorder    Tuberculosis    pt took meds for 9 months - finish meds last week      Social History   Socioeconomic History   Marital status: Single    Spouse name: Not on file   Number of children: Not on file   Years of  education: Not on file   Highest education level: 12th grade  Occupational History   Not on file  Tobacco Use   Smoking status: Former    Current packs/day: 0.00    Average packs/day: 1 pack/day for 50.0 years (50.0 ttl pk-yrs)    Types: Cigarettes    Start date: 07/08/1967    Quit date: 07/07/2017    Years since quitting: 6.0   Smokeless tobacco: Never   Tobacco comments:    4 cigs daily  Vaping Use   Vaping status: Never Used  Substance and  Sexual Activity   Alcohol use: No   Drug use: No    Types: Marijuana    Comment: 04/14/2017   Sexual activity: Never    Birth control/protection: I.U.D.  Other Topics Concern   Not on file  Social History Narrative   Not on file   Social Drivers of Health   Financial Resource Strain: Low Risk  (09/17/2022)   Overall Financial Resource Strain (CARDIA)    Difficulty of Paying Living Expenses: Not very hard  Food Insecurity: Low Risk  (06/17/2023)   Received from [facility]   Hunger Vital Sign    Worried About Running Out of Food in the Last Year: Never true    Ran Out of Food in the Last Year: Never true  Transportation Needs: No Transportation Needs (06/18/2023)   Received from [facility]   PRAPARE - Transportation    Lack of Transportation (Medical): No    Lack of Transportation (Non-Medical): No  Physical Activity: Insufficiently Active (09/17/2022)   Exercise Vital Sign    Days of Exercise per Week: 7 days    Minutes of Exercise per Session: 10 min  Stress: Stress Concern Present (01/15/2023)   [employer] of Occupational Health - Occupational Stress Questionnaire    Feeling of Stress : To some extent  Social Connections: Socially Isolated (09/17/2022)   Social Connection and Isolation Panel [NHANES]    Frequency of Communication with Friends and Family: Once a week    Frequency of Social Gatherings with Friends and Family: Three times a week    Attends Religious Services: Never    Active Member of Clubs or Organizations: No    Attends Engineer, structural: Not on file    Marital Status: Never married  Catering manager Violence: Not on file    Past Surgical History:  Procedure Laterality Date   BREAST LUMPECTOMY     c-section x 2     COLONOSCOPY     JOINT REPLACEMENT     left knee & left shoulder   left breast cyst removal     UPPER GASTROINTESTINAL ENDOSCOPY     WRIST SURGERY  2018   x2    Family History  Problem Relation Age of Onset    Colon cancer Neg Hx    Esophageal cancer Neg Hx    Rectal cancer Neg Hx    Stomach cancer Neg Hx     Allergies  Allergen Reactions   Clindamycin Hives, Rash and Swelling   Metrizamide     unknown   Motrin [Ibuprofen] Other (See Comments)    blackout States she is only allergic to 800 mg formulation   Iodinated Contrast Media Rash    unknown unknown   Milk-Related Compounds Diarrhea   Shrimp [Shellfish Allergy] Hives, Swelling and Rash    Current Outpatient Medications on File Prior to Visit  Medication Sig Dispense Refill   albuterol (VENTOLIN HFA) 108 (90 Base)  MCG/ACT inhaler INHALE 2 PUFFS INTO THE LUNGS BY MOUTH EVERY 6 HOURS AS NEEDED 18 g 10   alendronate (FOSAMAX) 70 MG tablet TAKE 1 TABLET BY MOUTH ONCE WEEKLY. TAKE WITH A FULL GLASS OF WATER, ON AN EMPTY STOMACH. 4 tablet 10   amLODipine (NORVASC) 10 MG tablet TAKE 1 TABLET BY MOUTH ONCE DAILY *PATIENT NEEDS APPOINTMENT FOR FURTHER REFILLS* 30 tablet 10   atorvastatin (LIPITOR) 40 MG tablet TAKE 1 TABLET BY MOUTH ONCE DAILY *PATIENT NEEDS APPOINTMENT FOR FURTHER REFILLS* 30 tablet 10   budesonide-formoterol (SYMBICORT) 160-4.5 MCG/ACT inhaler Inhale 2 puffs into the lungs 2 (two) times daily. 1 each 12   carvedilol (COREG) 6.25 MG tablet TAKE 1 TABLET BY MOUTH TWICE DAILY WITH MEALS 60 tablet 10   diclofenac Sodium (VOLTAREN) 1 % GEL APPLY 4 GRAMS TOPICALLY 4 (FOUR) TIMES DAILY. 100 g 1   donepezil (ARICEPT) 5 MG tablet TAKE 1 TABLET BY MOUTH DAILY AT BEDTIME 30 tablet 10   fluticasone (FLONASE) 50 MCG/ACT nasal spray INSTILL TWO (2) SPRAYS IN EACH NOSTRIL DAILY 16 g 10   folic acid (FOLVITE) 400 MCG tablet Take 1 tablet (400 mcg total) by mouth daily. 90 tablet 1   leflunomide (ARAVA) 10 MG tablet Take 10 mg by mouth daily.     methotrexate (RHEUMATREX) 2.5 MG tablet Take 3 tablets by mouth weekly.     mirtazapine (REMERON) 30 MG tablet TAKE 1 TABLET BY MOUTH AT BEDTIME 30 tablet 10   mupirocin ointment (BACTROBAN) 2 %  Apply 1 application topically 2 (two) times daily. 30 g 2   mupirocin ointment (BACTROBAN) 2 % Apply 1 Application topically 2 (two) times daily. 22 g 1   mupirocin ointment (BACTROBAN) 2 % APPLY TOPICALLY TWICE DAILY 22 g 10   oxybutynin (DITROPAN) 5 MG tablet TAKE 1 TABLET BY MOUTH TWICE DAILY 60 tablet 10   potassium chloride (KLOR-CON M) 10 MEQ tablet TAKE 1 TABLET BY MOUTH DAILY 7 tablet 10   tiZANidine (ZANAFLEX) 4 MG tablet TAKE 1 TABLET BY MOUTH EVERY 12 HOURS AS NEEDED FOR MUSCLE SPASM 60 tablet 2   triamcinolone cream (KENALOG) 0.1 % APPLY 1 APPLICATION TOPICALLY TWICE DAILY 80 g 10   Current Facility-Administered Medications on File Prior to Visit  Medication Dose Route Frequency Provider Last Rate Last Admin   0.9 %  sodium chloride infusion  500 mL Intravenous Continuous Iva Boop, MD        BP 138/66   Pulse 60   Resp 18   Ht 5' (1.524 m)   Wt 130 lb (59 kg)   SpO2 95%   BMI 25.39 kg/m        Objective:   Physical Exam  General Mental Status- Alert. General Appearance- Not in acute distress.   Skin General: Color- Normal Color. Moisture- Normal Moisture.  Neck Carotid Arteries- Normal color. Moisture- Normal Moisture. No carotid bruits. No JVD.  Chest and Lung Exam Auscultation: Breath Sounds:-CTA  Cardiovascular Auscultation:Rythm- RRR Murmurs & Other Heart Sounds:Auscultation of the heart reveals- No Murmurs.  Abdomen Inspection:-Inspeection Normal. Palpation/Percussion:Note:No mass. Palpation and Percussion of the abdomen reveal- Non Tender, Non Distended + BS, no rebound or guarding.  Neurologic Cranial Nerve exam:- CN III-XII intact(No nystagmus), symmetric smile.   Hand- severe arthritic changes to both hands. Prior abscess area healed. No redness. No warmth     Assessment & Plan:   Patient Instructions  Right Hand Abscess Resolved following incision and drainage, hospitalization, and antibiotic treatment (  IV and oral Keflex). No  further treatment needed. - No further action required.  Speech Therapy Referral Unclear indication for speech therapy as patient has no speech or swallowing issues. Therapy already initiated. - Discontinue speech therapy as it is not necessary.  Renal Lesion Incidental finding on CT scan of abdomen and pelvis. Requires further evaluation. - Order renal ultrasound for further evaluation.  Intrauterine Device (IUD) Incidental finding of chronically perforated IUD on CT scan. No current symptoms. - Refer to gynecologist for further evaluation and potential removal.  Hyperkalemia Elevated potassium level on recent labs. Unclear if patient is currently taking potassium supplement. - Repeat metabolic panel to reassess potassium level.  Anemia Mild anemia noted on recent labs. - Repeat CBC and order iron panel for further evaluation.  Arthritis Patient's arthritis medications (Arava and Methotrexate) were temporarily suspended due to hand abscess. Patient has upcoming appointment with rheumatologist. - Await CBC results before advising to resume arthritis medications.  Lung Nodules Incidental finding on CT scan. Patient has significant smoking history. - Schedule repeat CT scan in June 2025 to monitor nodules.  Follow up in 6 months or sooner if needed   Time spent with patient today was   which consisted of chart revdiew, discussing diagnosis, work up treatment and documentation.

## 2023-07-08 NOTE — Patient Instructions (Addendum)
 Right Hand Abscess Resolved following incision and drainage, hospitalization, and antibiotic treatment (IV and oral Keflex). No further treatment needed. - No further action required.  Speech Therapy Referral Unclear indication for speech therapy as patient has no speech or swallowing issues. Therapy already initiated. - Discontinue speech therapy as it is not necessary.  Renal Lesion Incidental finding on CT scan of abdomen and pelvis. Requires further evaluation. - Order renal ultrasound for further evaluation.  Intrauterine Device (IUD) Incidental finding of chronically perforated IUD on CT scan. No current symptoms. - Refer to gynecologist for further evaluation and potential removal.  Hyperkalemia Elevated potassium level on recent labs. Unclear if patient is currently taking potassium supplement. - Repeat metabolic panel to reassess potassium level.  Anemia Mild anemia noted on recent labs. - Repeat CBC and order iron panel for further evaluation.  Arthritis Patient's arthritis medications (Arava and Methotrexate) were temporarily suspended due to hand abscess. Patient has upcoming appointment with rheumatologist. - Await CBC results before advising to resume arthritis medications.  Lung Nodules Incidental finding on CT scan. Patient has significant smoking history. - Schedule repeat CT scan in June 2025 to monitor nodules.  urinary incontinence. On ditropan 5 mg bid.(Fill out form for urinary incontinence supplies)  Follow up in 6 months or sooner if needed

## 2023-07-08 NOTE — Addendum Note (Signed)
Addended by: Thelma Barge D on: 07/08/2023 09:03 AM   Modules accepted: Orders

## 2023-07-09 ENCOUNTER — Encounter: Payer: Self-pay | Admitting: Medical

## 2023-07-29 ENCOUNTER — Ambulatory Visit (HOSPITAL_BASED_OUTPATIENT_CLINIC_OR_DEPARTMENT_OTHER)
Admission: RE | Admit: 2023-07-29 | Discharge: 2023-07-29 | Disposition: A | Discharge status: Home / Self Care | Payer: Medicare Other | Source: Ambulatory Visit | Attending: Medical | Admitting: Medical

## 2023-07-29 DIAGNOSIS — R93422 Abnormal radiologic findings on diagnostic imaging of left kidney: Secondary | ICD-10-CM | POA: Diagnosis present

## 2023-07-29 DIAGNOSIS — N289 Disorder of kidney and ureter, unspecified: Secondary | ICD-10-CM | POA: Insufficient documentation

## 2023-07-30 ENCOUNTER — Encounter: Payer: Self-pay | Admitting: Medical

## 2023-07-30 NOTE — Addendum Note (Signed)
 Addended by: Gwenevere Abbot on: 07/30/2023 08:59 PM   Modules accepted: Orders

## 2023-08-03 ENCOUNTER — Encounter: Payer: Self-pay | Admitting: Medical

## 2023-08-03 ENCOUNTER — Ambulatory Visit (HOSPITAL_COMMUNITY)
Admission: RE | Admit: 2023-08-03 | Discharge: 2023-08-03 | Disposition: A | Discharge status: Home / Self Care | Source: Ambulatory Visit | Attending: Medical | Admitting: Medical

## 2023-08-03 DIAGNOSIS — N2889 Other specified disorders of kidney and ureter: Secondary | ICD-10-CM | POA: Insufficient documentation

## 2023-08-03 DIAGNOSIS — N289 Disorder of kidney and ureter, unspecified: Secondary | ICD-10-CM | POA: Diagnosis present

## 2023-08-03 MED ORDER — GADOBUTROL 1 MMOL/ML IV SOLN
6.0000 mL | Freq: Once | INTRAVENOUS | Status: AC | PRN
  Administered 2023-08-03: 6 mL via INTRAVENOUS

## 2023-08-25 ENCOUNTER — Telehealth: Payer: Self-pay | Admitting: Emergency Medicine

## 2023-08-25 LAB — COMPREHENSIVE METABOLIC PANEL WITH GFR
Calcium: 9.8
EGFR: 90

## 2023-08-25 MED ORDER — AMLODIPINE BESYLATE 5 MG PO TABS: 5.0000 mg | ORAL_TABLET | Freq: Every day | ORAL | 2 refills | Status: DC

## 2023-08-25 NOTE — Telephone Encounter (Signed)
 Copied from CRM [id number]. Topic: Clinical - Prescription Issue >> Aug 25, 2023  4:46 PM Eunice Blase wrote: Reason for CRM: Pt's granddaughter call stated pt lost or threw away medicationamLODipine (NORVASC) 5 MG tablet by accident. Please call pt at [phone].

## 2023-08-26 NOTE — Telephone Encounter (Signed)
 Rx refilled.

## 2023-09-08 ENCOUNTER — Other Ambulatory Visit: Payer: Self-pay | Admitting: Medical

## 2023-11-04 NOTE — Addendum Note (Signed)
 Addended by: Serafina Damme on: 11/04/2023 04:49 PM   Modules accepted: Orders

## 2023-11-08 ENCOUNTER — Other Ambulatory Visit: Payer: Self-pay | Admitting: Medical

## 2023-11-11 ENCOUNTER — Ambulatory Visit (HOSPITAL_BASED_OUTPATIENT_CLINIC_OR_DEPARTMENT_OTHER)

## 2023-11-14 ENCOUNTER — Ambulatory Visit (HOSPITAL_BASED_OUTPATIENT_CLINIC_OR_DEPARTMENT_OTHER)

## 2023-11-15 ENCOUNTER — Telehealth: Payer: Self-pay | Admitting: Medical

## 2023-11-15 NOTE — Telephone Encounter (Signed)
 Pt missed her repeat ct chest to evaluate nodule. Can she be rescheduled. Not sure what happened?

## 2023-11-15 NOTE — Telephone Encounter (Signed)
 Spoke with grand daughter , transferred her to radiology to r/s

## 2023-11-16 ENCOUNTER — Ambulatory Visit (HOSPITAL_BASED_OUTPATIENT_CLINIC_OR_DEPARTMENT_OTHER)

## 2023-11-16 ENCOUNTER — Ambulatory Visit (HOSPITAL_BASED_OUTPATIENT_CLINIC_OR_DEPARTMENT_OTHER)
Admission: RE | Admit: 2023-11-16 | Discharge: 2023-11-16 | Disposition: A | Discharge status: Home / Self Care | Source: Ambulatory Visit | Attending: Medical | Admitting: Medical

## 2023-11-16 DIAGNOSIS — R911 Solitary pulmonary nodule: Secondary | ICD-10-CM | POA: Insufficient documentation

## 2023-11-17 ENCOUNTER — Ambulatory Visit: Payer: Self-pay | Admitting: Medical

## 2023-11-23 ENCOUNTER — Ambulatory Visit (INDEPENDENT_AMBULATORY_CARE_PROVIDER_SITE_OTHER): Admitting: Medical

## 2023-11-23 ENCOUNTER — Encounter: Payer: Self-pay | Admitting: Medical

## 2023-11-23 VITALS — BP 130/70 | HR 80 | Ht 60.0 in | Wt 132.8 lb

## 2023-11-23 DIAGNOSIS — R748 Abnormal levels of other serum enzymes: Secondary | ICD-10-CM

## 2023-11-23 DIAGNOSIS — K7689 Other specified diseases of liver: Secondary | ICD-10-CM | POA: Diagnosis not present

## 2023-11-23 NOTE — Progress Notes (Signed)
 Subjective:    Patient ID: [name], female    DOB: 1945/06/23, 78 y.o.   MRN: [id number]  HPI Kristy Oliver is a 78 year old female who presents for evaluation of CT findings and liver health.  She has a history of heavy alcohol consumption for approximately 30 to 40 years, which ceased about 10 to 12 years ago. Currently, she consumes alcohol minimally, around her birthday and New Year's, with only one drink on these occasions. Despite her past alcohol use, her liver enzymes have not shown chronic elevation, with the last check being three months ago. A recent CT scan suggested possible cirrhosis of the liver, which is being evaluated further given her history of alcohol use.  The CT scan also revealed several other findings, including a previously noted nodule in the left lower lung that has been followed over time. However, the scan identified bronchiectasis. .' No fever, chest congestion, or cough at present.  Additionally, the CT scan noted descending colon diverticulosis. She has not reported any constant pain in her left flank.  She experiences middle back pain, which has been ongoing. The CT scan indicated compression fractures at T6, T7, and T8, which were also noted in 2019. She has a history of a deviated spine and avoids ibuprofen due to a past adverse reaction in 2016, where she 'blacked out.' She currently uses Aleve for pain management. No adverse side effect when uses.   Review of Systems  Constitutional:  Negative for chills, fatigue and fever.  Respiratory:  Positive for cough. Negative for chest tightness, shortness of breath and wheezing.   Cardiovascular:  Negative for chest pain and palpitations.  Gastrointestinal:  Negative for abdominal distention, anal bleeding, blood in stool, diarrhea and vomiting.       None reported but faint left side pain on exam.  Genitourinary:  Negative for dysuria and frequency.  Musculoskeletal:  Positive for back pain. Negative  for neck pain.  Skin:  Negative for rash.  Neurological:  Negative for dizziness, seizures, weakness and headaches.  Hematological:  Negative for adenopathy. Does not bruise/bleed easily.  Psychiatric/Behavioral:  Negative for behavioral problems and decreased concentration.     Past Medical History:  Diagnosis Date   Allergy    Arthritis    Asthma    as young person, none currently   Facial cellulitis 11/17/2012   Gout    Helicobacter pylori gastritis 12/13/2016   HTN (hypertension), malignant 11/17/2012   Hx of adenomatous colonic polyps 12/29/2016   Hyperlipidemia    medicine currently   Hypertension    Osteoporosis    Renal disorder    Tuberculosis    pt took meds for 9 months - finish meds last week      Social History   Socioeconomic History   Marital status: Single    Spouse name: Not on file   Number of children: Not on file   Years of education: Not on file   Highest education level: 12th grade  Occupational History   Not on file  Tobacco Use   Smoking status: Former    Current packs/day: 0.00    Average packs/day: 1 pack/day for 50.0 years (50.0 ttl pk-yrs)    Types: Cigarettes    Start date: 07/08/1967    Quit date: 07/07/2017    Years since quitting: 6.3   Smokeless tobacco: Never   Tobacco comments:    4 cigs daily  Vaping Use   Vaping status: Never Used  Substance and  Sexual Activity   Alcohol use: No   Drug use: No    Types: Marijuana    Comment: 04/14/2017   Sexual activity: Never    Birth control/protection: I.U.D.  Other Topics Concern   Not on file  Social History Narrative   Not on file   Social Drivers of Health   Financial Resource Strain: Low Risk  (11/22/2023)   Overall Financial Resource Strain (CARDIA)    Difficulty of Paying Living Expenses: Not hard at all  Food Insecurity: No Food Insecurity (11/22/2023)   Hunger Vital Sign    Worried About Running Out of Food in the Last Year: Never true    Ran Out of Food in the Last Year:  Never true  Transportation Needs: No Transportation Needs (11/22/2023)   PRAPARE - Administrator, Civil Service (Medical): No    Lack of Transportation (Non-Medical): No  Physical Activity: Inactive (11/22/2023)   Exercise Vital Sign    Days of Exercise per Week: 0 days    Minutes of Exercise per Session: Not on file  Stress: No Stress Concern Present (11/22/2023)   [employer] of Occupational Health - Occupational Stress Questionnaire    Feeling of Stress: Not at all  Social Connections: Socially Isolated (11/22/2023)   Social Connection and Isolation Panel    Frequency of Communication with Friends and Family: Twice a week    Frequency of Social Gatherings with Friends and Family: Three times a week    Attends Religious Services: Never    Active Member of Clubs or Organizations: No    Attends Engineer, structural: Not on file    Marital Status: Never married  Catering manager Violence: Not on file    Past Surgical History:  Procedure Laterality Date   BREAST LUMPECTOMY     c-section x 2     COLONOSCOPY     JOINT REPLACEMENT     left knee & left shoulder   left breast cyst removal     UPPER GASTROINTESTINAL ENDOSCOPY     WRIST SURGERY  2018   x2    Family History  Problem Relation Age of Onset   Colon cancer Neg Hx    Esophageal cancer Neg Hx    Rectal cancer Neg Hx    Stomach cancer Neg Hx     Allergies  Allergen Reactions   Clindamycin  Hives, Rash and Swelling   Metrizamide     unknown   Motrin [Ibuprofen] Other (See Comments)    blackout States she is only allergic to 800 mg formulation   Iodinated Contrast Media Rash    unknown unknown   Milk-Related Compounds Diarrhea   Shrimp [Shellfish Allergy] Hives, Swelling and Rash    Current Outpatient Medications on File Prior to Visit  Medication Sig Dispense Refill   albuterol  (VENTOLIN  HFA) 108 (90 Base) MCG/ACT inhaler Inhale 1-2 puffs into the lungs every 6 (six) hours as needed  for wheezing or shortness of breath. 18 g 10   alendronate  (FOSAMAX ) 70 MG tablet Take 1 tablet (70 mg total) by mouth once a week. Take with a full glass of water on an empty stomach. 4 tablet 10   allopurinol  (ZYLOPRIM ) 100 MG tablet Take 1 tablet (100 mg total) by mouth daily. 30 tablet 6   amLODipine  (NORVASC ) 5 MG tablet Take 1 tablet (5 mg total) by mouth daily. 90 tablet 2   atorvastatin  (LIPITOR) 40 MG tablet TAKE 1 TABLET BY MOUTH ONCE DAILY *PATIENT NEEDS  APPOINTMENT FOR FURTHER REFILLS* 30 tablet 10   budesonide -formoterol  (SYMBICORT ) 160-4.5 MCG/ACT inhaler Inhale 2 puffs into the lungs 2 (two) times daily. 1 each 12   carvedilol  (COREG ) 3.125 MG tablet TAKE 1 TABLET BY MOUTH TWICE DAILY WITH A MEAL 60 tablet 11   diclofenac  Sodium (VOLTAREN ) 1 % GEL APPLY 4 GRAMS TOPICALLY 4 (FOUR) TIMES DAILY. 100 g 1   donepezil  (ARICEPT ) 5 MG tablet Take 1 tablet (5 mg total) by mouth at bedtime. 30 tablet 10   fluticasone  (FLONASE ) 50 MCG/ACT nasal spray Place 1 spray into both nostrils daily. 16 g 10   folic acid  (FOLVITE ) 400 MCG tablet Take 1 tablet (400 mcg total) by mouth daily. 90 tablet 1   leflunomide (ARAVA) 10 MG tablet Take 10 mg by mouth daily.     methotrexate  (RHEUMATREX) 2.5 MG tablet Take 3 tablets by mouth weekly.     mirtazapine  (REMERON ) 30 MG tablet Take 1 tablet (30 mg total) by mouth at bedtime. 30 tablet 10   mupirocin  ointment (BACTROBAN ) 2 % Apply 1 application topically 2 (two) times daily. 30 g 2   mupirocin  ointment (BACTROBAN ) 2 % Apply 1 Application topically 2 (two) times daily. 22 g 1   mupirocin  ointment (BACTROBAN ) 2 % APPLY TOPICALLY TWICE DAILY 22 g 11   omeprazole  (PRILOSEC) 20 MG capsule Take 1 capsule (20 mg total) by mouth daily. 30 capsule 3   oxybutynin  (DITROPAN ) 5 MG tablet Take 1 tablet (5 mg total) by mouth 2 (two) times daily. 60 tablet 10   potassium chloride  (KLOR-CON  M) 10 MEQ tablet TAKE 1 TABLET BY MOUTH DAILY 7 tablet 11   tiZANidine  (ZANAFLEX )  4 MG tablet TAKE 1 TABLET BY MOUTH EVERY 12 HOURS AS NEEDED FOR MUSCLE SPASM 60 tablet 2   triamcinolone  cream (KENALOG ) 0.1 % APPLY 1 APPLICATION TOPICALLY TWICE DAILY 80 g 10   Current Facility-Administered Medications on File Prior to Visit  Medication Dose Route Frequency Provider Last Rate Last Admin   0.9 %  sodium chloride  infusion  500 mL Intravenous Continuous Avram Lupita BRAVO, MD        BP 130/70   Pulse 80   Ht 5' (1.524 m)   Wt 132 lb 12.8 oz (60.2 kg)   SpO2 96%   BMI 25.94 kg/m        Objective:   Physical Exam  General Mental Status- Alert. General Appearance- Not in acute distress.   Skin General: Color- Normal Color. Moisture- Normal Moisture.  Neck  No JVD.  Chest and Lung Exam Auscultation: Breath Sounds:-Normal.  Cardiovascular Auscultation:Rythm- Regular. Murmurs & Other Heart Sounds:Auscultation of the heart reveals- No Murmurs.  Abdomen Inspection:-Inspeection Normal. Palpation/Percussion:Note:No mass. Palpation and Percussion of the abdomen reveal- faint left side lower abdomenTender(difficult to palpate since she has large pendulous breast that extend over entire abdomen), Non Distended + BS, no rebound or guarding.   Neurologic Cranial Nerve exam:- CN III-XII intact(No nystagmus), symmetric smile. Strength:- 5/5 equal and symmetric strength both upper and lower extremities.       Assessment & Plan:   Liver Cirrhosis(possible) Possible liver cirrhosis suggested by CT scan. History of heavy alcohol use ceased 10-12 years ago. Current liver enzymes not severely elevated. - Order liver ultrasound for detailed assessment. - Check liver enzymes, platelets, and CBC.  Bronchiectasis Bronchiectasis identified on CT scan, increasing risk for lung infections. No current symptoms of infection. - Monitor for symptoms of lung infection such as chest congestion or increasing colored  mucus. - Advise to report any new symptoms for potential  antibiotic treatment.  Descending Colon Diverticulosis Descending colon diverticulosis identified on CT scan. - Monitor for symptoms such as constant left flank pain. - Advise to report any new symptoms for potential antibiotic treatment.  Vertebral Compression Fractures Compression fractures at T6, T7, and T8 noted on CT scan. History of thoracic level compression fractures since 2019 with middle back pain. - Recommend Aleve for pain management, avoiding ibuprofen due to past adverse reaction.  Follow up date to be determined after lab and imaging review.  Marinda Aaleigha Bozza, PA-C

## 2023-11-23 NOTE — Patient Instructions (Addendum)
 Liver Cirrhosis(possible) Possible liver cirrhosis suggested by CT scan. History of heavy alcohol use ceased 10-12 years ago. Current liver enzymes not severely elevated. - Order liver ultrasound for detailed assessment. - Check liver enzymes, platelets, and CBC.  Bronchiectasis Bronchiectasis identified on CT scan, increasing risk for lung infections. No current symptoms of infection. - Monitor for symptoms of lung infection such as chest congestion or increasing colored mucus. - Advise to report any new symptoms for potential antibiotic treatment.  Descending Colon Diverticulosis Descending colon diverticulosis identified on CT scan. - Monitor for symptoms such as constant left flank pain. - Advise to report any new symptoms for potential antibiotic treatment.  Vertebral Compression Fractures Compression fractures at T6, T7, and T8 noted on CT scan. History of thoracic level compression fractures since 2019 with middle back pain. - Recommend Aleve for pain management, avoiding ibuprofen due to past adverse reaction.  Follow up date to be determined after lab and imaging review.

## 2023-11-24 ENCOUNTER — Ambulatory Visit: Payer: Self-pay | Admitting: Medical

## 2023-11-24 LAB — COMPREHENSIVE METABOLIC PANEL WITH GFR
ALT: 19 U/L (ref 0–35)
AST: 45 U/L — ABNORMAL HIGH (ref 0–37)
Albumin: 3.7 g/dL (ref 3.5–5.2)
Alkaline Phosphatase: 287 U/L — ABNORMAL HIGH (ref 39–117)
BUN: 12 mg/dL (ref 6–23)
CO2: 24 meq/L (ref 19–32)
Calcium: 9.4 mg/dL (ref 8.4–10.5)
Chloride: 106 meq/L (ref 96–112)
Creatinine, Ser: 0.73 mg/dL (ref 0.40–1.20)
GFR: 80.4 mL/min (ref >60.00)
Glucose, Bld: 78 mg/dL (ref 70–99)
Potassium: 4 meq/L (ref 3.5–5.1)
Sodium: 138 meq/L (ref 135–145)
Total Bilirubin: 0.6 mg/dL (ref 0.2–1.2)
Total Protein: 9.2 g/dL — ABNORMAL HIGH (ref 6.0–8.3)

## 2023-11-24 LAB — CBC WITH DIFFERENTIAL/PLATELET
Basophils Absolute: 0.1 10*3/uL (ref 0.0–0.1)
Basophils Relative: 1.1 % (ref 0.0–3.0)
Eosinophils Absolute: 0.2 10*3/uL (ref 0.0–0.7)
Eosinophils Relative: 4.1 % (ref 0.0–5.0)
HCT: 41.7 % (ref 36.0–46.0)
Hemoglobin: 13.8 g/dL (ref 12.0–15.0)
Lymphocytes Relative: 30.7 % (ref 12.0–46.0)
Lymphs Abs: 1.6 10*3/uL (ref 0.7–4.0)
MCHC: 33.1 g/dL (ref 30.0–36.0)
MCV: 92.8 fl (ref 78.0–100.0)
Monocytes Absolute: 0.1 10*3/uL (ref 0.1–1.0)
Monocytes Relative: 2.9 % — ABNORMAL LOW (ref 3.0–12.0)
Neutro Abs: 3.1 10*3/uL (ref 1.4–7.7)
Neutrophils Relative %: 61.2 % (ref 43.0–77.0)
Platelets: 195 10*3/uL (ref 150.0–400.0)
RBC: 4.5 Mil/uL (ref 3.87–5.11)
RDW: 15.1 % (ref 11.5–15.5)
WBC: 5.1 10*3/uL (ref 4.0–10.5)

## 2023-11-30 ENCOUNTER — Ambulatory Visit (HOSPITAL_BASED_OUTPATIENT_CLINIC_OR_DEPARTMENT_OTHER)
Admission: RE | Admit: 2023-11-30 | Discharge: 2023-11-30 | Disposition: A | Discharge status: Home / Self Care | Source: Ambulatory Visit | Attending: Medical | Admitting: Medical

## 2023-11-30 DIAGNOSIS — R748 Abnormal levels of other serum enzymes: Secondary | ICD-10-CM | POA: Insufficient documentation

## 2023-12-01 NOTE — Addendum Note (Signed)
 Addended by: [name] HERO on: 12/01/2023 10:34 AM   Modules accepted: Orders

## 2023-12-01 NOTE — Addendum Note (Signed)
 Addended by: [name] HERO on: 12/01/2023 10:37 AM   Modules accepted: Orders

## 2023-12-06 ENCOUNTER — Encounter: Payer: Self-pay | Admitting: Physician Assistant

## 2023-12-08 ENCOUNTER — Other Ambulatory Visit: Payer: Self-pay | Admitting: Medical

## 2024-01-06 ENCOUNTER — Other Ambulatory Visit: Payer: Self-pay | Admitting: Medical

## 2024-01-28 ENCOUNTER — Ambulatory Visit: Admitting: Physician Assistant

## 2024-02-07 ENCOUNTER — Other Ambulatory Visit: Payer: Self-pay | Admitting: Medical

## 2024-02-07 DIAGNOSIS — R21 Rash and other nonspecific skin eruption: Secondary | ICD-10-CM

## 2024-02-08 ENCOUNTER — Telehealth: Payer: Self-pay

## 2024-02-08 ENCOUNTER — Other Ambulatory Visit: Payer: Self-pay | Admitting: Medical

## 2024-02-08 ENCOUNTER — Ambulatory Visit: Admitting: Medical

## 2024-02-08 DIAGNOSIS — R630 Anorexia: Secondary | ICD-10-CM

## 2024-02-08 NOTE — Telephone Encounter (Signed)
 Called to get pt checked in for video visit but received no answer  Rings then busy signal will call again later

## 2024-02-14 ENCOUNTER — Encounter: Payer: Self-pay | Admitting: Internal Medicine

## 2024-03-10 ENCOUNTER — Other Ambulatory Visit: Payer: Self-pay | Admitting: Medical

## 2024-03-10 ENCOUNTER — Other Ambulatory Visit: Payer: Self-pay

## 2024-03-10 MED ORDER — DONEPEZIL HCL 5 MG PO TABS: 5.0000 mg | ORAL_TABLET | Freq: Every day | ORAL | 10 refills | Status: AC

## 2024-03-22 NOTE — Progress Notes (Deleted)
 Kristy Console, PA-C [address] [place], [place]  [address] Phone: [phone]   Gastroenterology Consultation  Referring Provider:     [name] RIGGERS Primary Care Physician:  [name], PA-C Primary Gastroenterologist:  Kristy Console, PA-C / Lupita Commander, MD  Reason for Consultation:     Elevated liver enzymes        HPI:   Discussed the use of AI scribe software for clinical note transcription with the patient, who gave verbal consent to proceed.  New patient.  Here to evaluate elevated liver enzymes.  She has had chronically elevated alkaline phosphatase for many years (since at least 2016 with review of her chart).  Alcohol? Family updated liver disease?  Most recent labs done 11/23/2023 showed elevated alkaline phosphatase 287, AST 45.  Normal ALT 19 and total bilirubin 0.6.  GFR 80.  Normal CBC with WBC 5.1, Hgb 13.8, MCV 92, platelet 195.  11/30/2023 RUQ ultrasound:   1. Small volume biliary sludge with a couple of small gallstones. No changes of acute cholecystitis. 2. Subtle nodular contour of the liver, as can be seen in cirrhosis.  07/2023 abdominal MRI with and without contrast: - Benign right and left kidney cysts.  No follow-up imaging recommended. - No liver lesions.  Small gallstone.  No cirrhosis or ascites was mentioned.  No biliary dilatation.  History of Present Illness   PMH:  hypertension, H. pylori gastritis, peptic ulcer disease, history of adenomatous colon polyps osteoporosis, rheumatoid arthritis, vertebral fractures, hyperlipidemia, asthma, gout, depression, tobacco use disorder.  11/2016 EGD by Dr. Commander: Multiple dispersed diminutive nonbleeding gastric erosions.  A few diffuse duodenal erosions without bleeding.  Erosive gastritis and duodenitis.  Biopsies positive for H. pylori.  No metaplasia.  H. pylori was treated with metronidazole , omeprazole , bismuth  subsalicylate, doxycycline .  I do not see where she had any follow-up  H. pylori testing.  12/2016 screening colonoscopy by Dr. Commander: 2 small (4 mm to 6 mm) tubular adenoma polyps removed.  Excellent prep.  Sigmoid diverticulosis.  Otherwise normal.  7-year repeat (due 12/2023).  Past Medical History:  Diagnosis Date   Allergy    Arthritis    Asthma    as young person, none currently   Facial cellulitis 11/17/2012   Gout    Helicobacter pylori gastritis 12/13/2016   HTN (hypertension), malignant 11/17/2012   Hx of adenomatous colonic polyps 12/29/2016   Hyperlipidemia    medicine currently   Hypertension    Osteoporosis    Renal disorder    Tuberculosis    pt took meds for 9 months - finish meds last week     Past Surgical History:  Procedure Laterality Date   BREAST LUMPECTOMY     c-section x 2     COLONOSCOPY     JOINT REPLACEMENT     left knee & left shoulder   left breast cyst removal     UPPER GASTROINTESTINAL ENDOSCOPY     WRIST SURGERY  2018   x2    Prior to Admission medications   Medication Sig Start Date End Date Taking? Authorizing Provider  albuterol  (VENTOLIN  HFA) 108 (90 Base) MCG/ACT inhaler Inhale 1-2 puffs into the lungs every 6 (six) hours as needed for wheezing or shortness of breath. 07/08/23   Saguier, Dallas, PA-C  alendronate  (FOSAMAX ) 70 MG tablet Take 1 tablet (70 mg total) by mouth once a week. Take with a full glass of water on an empty stomach. 07/08/23  Saguier, Dallas, PA-C  allopurinol  (ZYLOPRIM ) 100 MG tablet Take 1 tablet (100 mg total) by mouth daily. 07/08/23   Saguier, Dallas, PA-C  amLODipine  (NORVASC ) 5 MG tablet Take 1 tablet (5 mg total) by mouth daily. 08/25/23   Saguier, Dallas, PA-C  atorvastatin  (LIPITOR) 40 MG tablet TAKE 1 TABLET BY MOUTH ONCE DAILY *PATIENT NEEDS APPOINTMENT FOR FURTHER REFILLS* 07/08/23   Saguier, Dallas, PA-C  budesonide -formoterol  (SYMBICORT ) 160-4.5 MCG/ACT inhaler Inhale 2 puffs into the lungs 2 (two) times daily. 05/28/22   Saguier, Dallas, PA-C  carvedilol  (COREG ) 3.125 MG tablet  TAKE 1 TABLET BY MOUTH TWICE DAILY WITH A MEAL 11/09/23   Saguier, Dallas, PA-C  diclofenac  Sodium (VOLTAREN ) 1 % GEL APPLY 4 GRAMS TOPICALLY 4 (FOUR) TIMES DAILY. 04/13/22   Saguier, Dallas, PA-C  donepezil  (ARICEPT ) 5 MG tablet Take 1 tablet (5 mg total) by mouth at bedtime. 03/10/24   Saguier, Dallas, PA-C  fluticasone  (FLONASE ) 50 MCG/ACT nasal spray Place 1 spray into both nostrils daily. 07/08/23   Saguier, Dallas, PA-C  folic acid  (FOLVITE ) 400 MCG tablet Take 1 tablet (400 mcg total) by mouth daily. 04/13/22   Saguier, Dallas, PA-C  leflunomide (ARAVA) 10 MG tablet Take 10 mg by mouth daily. 06/07/18   Lenor Rogue, PA-C  methotrexate  (RHEUMATREX) 2.5 MG tablet Take 3 tablets by mouth weekly. 02/14/20   [provider]  mirtazapine  (REMERON ) 30 MG tablet Take 1 tablet (30 mg total) by mouth at bedtime. 07/08/23   Saguier, Dallas, PA-C  mupirocin  ointment (BACTROBAN ) 2 % Apply 1 application topically 2 (two) times daily. 02/17/19   Gershon Donnice SAUNDERS, DPM  mupirocin  ointment (BACTROBAN ) 2 % Apply 1 Application topically 2 (two) times daily. 05/28/22   Saguier, Dallas, PA-C  mupirocin  ointment (BACTROBAN ) 2 % APPLY TOPICALLY TWICE DAILY 09/09/23   Saguier, Dallas, PA-C  omeprazole  (PRILOSEC) 20 MG capsule Take 1 capsule (20 mg total) by mouth daily. 12/09/23   Saguier, Dallas, PA-C  oxybutynin  (DITROPAN ) 5 MG tablet Take 1 tablet (5 mg total) by mouth 2 (two) times daily. 07/08/23   Saguier, Dallas, PA-C  potassium chloride  (KLOR-CON  M) 10 MEQ tablet TAKE 1 TABLET BY MOUTH DAILY 09/09/23   Saguier, Dallas, PA-C  tiZANidine  (ZANAFLEX ) 4 MG tablet TAKE 1 TABLET BY MOUTH EVERY 12 HOURS AS NEEDED FOR MUSCLE SPASM 10/14/19   Newlin, Enobong, MD  triamcinolone  cream (KENALOG ) 0.1 % APPLY 1 APPLICATION TOPICALLY TWICE DAILY 03/12/23   Saguier, Dallas, PA-C    Family History  Problem Relation Age of Onset   Colon cancer Neg Hx    Esophageal cancer Neg Hx    Rectal cancer Neg Hx    Stomach cancer Neg  Hx      Social History   Tobacco Use   Smoking status: Former    Current packs/day: 0.00    Average packs/day: 1 pack/day for 50.0 years (50.0 ttl pk-yrs)    Types: Cigarettes    Start date: 07/08/1967    Quit date: 07/07/2017    Years since quitting: 6.7   Smokeless tobacco: Never   Tobacco comments:    4 cigs daily  Vaping Use   Vaping status: Never Used  Substance Use Topics   Alcohol use: No   Drug use: No    Types: Marijuana    Comment: 04/14/2017    Allergies as of 03/23/2024 - Review Complete 11/23/2023  Allergen Reaction Noted   Clindamycin  Hives, Rash, and Swelling 11/17/2012   Metrizamide  11/17/2012   Motrin [  ibuprofen] Other (See Comments) 11/17/2012   Iodinated contrast media Rash 11/17/2012   Milk-related compounds Diarrhea 11/15/2015   Shrimp [shellfish allergy] Hives, Swelling, and Rash 11/17/2012    Review of Systems:    All systems reviewed and negative except where noted in HPI.   Physical Exam:  There were no vitals taken for this visit. No LMP recorded. Patient is postmenopausal.  General:   Alert,  Well-developed, well-nourished, pleasant and cooperative in NAD Lungs:  Respirations even and unlabored.  Clear throughout to auscultation.   No wheezes, crackles, or rhonchi. No acute distress. Heart:  Regular rate and rhythm; no murmurs, clicks, rubs, or gallops. Abdomen:  Normal bowel sounds.  No bruits.  Soft, and non-distended without masses, hepatosplenomegaly or hernias noted.  No Tenderness.  No guarding or rebound tenderness.    Neurologic:  Alert and oriented x3;  grossly normal neurologically. Psych:  Alert and cooperative. Normal mood and affect.   Imaging Studies: No results found.  Labs: CBC    Component Value Date/Time   WBC 5.1 11/23/2023 1621   RBC 4.50 11/23/2023 1621   HGB 13.8 11/23/2023 1621   HGB 14.9 12/05/2019 1138   HCT 41.7 11/23/2023 1621   HCT 44.3 12/05/2019 1138   PLT 195.0 11/23/2023 1621   PLT 216 12/05/2019  1138   MCV 92.8 11/23/2023 1621   MCV 91 12/05/2019 1138    CMP     Component Value Date/Time   NA 138 11/23/2023 1621   NA 139 07/15/2021 1153   K 4.0 11/23/2023 1621   CL 106 11/23/2023 1621   CO2 24 11/23/2023 1621   GLUCOSE 78 11/23/2023 1621   BUN 12 11/23/2023 1621   BUN 6 (L) 07/15/2021 1153   CREATININE 0.73 11/23/2023 1621   CREATININE 0.68 08/20/2017 1526   CALCIUM  9.4 11/23/2023 1621   CALCIUM  9.8 08/25/2023 0000   PROT 9.2 (H) 11/23/2023 1621   PROT 8.6 (H) 07/15/2021 1153   ALBUMIN 3.7 11/23/2023 1621   ALBUMIN 3.9 07/15/2021 1153   AST 45 (H) 11/23/2023 1621   ALT 19 11/23/2023 1621   ALKPHOS 287 (H) 11/23/2023 1621   BILITOT 0.6 11/23/2023 1621   BILITOT 0.4 07/15/2021 1153   GFRNONAA 63 06/03/2020 1043   GFRNONAA 89 08/20/2017 1526   GFRAA 73 06/03/2020 1043   GFRAA 103 08/20/2017 1526    Assessment and Plan:   Zayana Salvador is a 79 y.o. y/o female has been referred for:  1.  Chronically elevated alkaline phosphatase for many years (since at least 2016).  Mildly elevated AST.  Normal bilirubin and ALT. - Labs: Alkaline phosphatase isoenzymes, GGT, intact PTH and calcium , hepatic panel, ANA, AMA, ASMA, TSH.   - Labs: ANA, AMA, ASMA, ceruloplasmin, viral hepatitis A/B/C, iron panel, ferritin, immunoglobulins, alpha-1 antitrypsin   2.  Subtle nodular contour of the liver, concerning for for cirrhosis on ultrasound, but not mentioned on MRI.  3.  Cholelithiasis without cholecystitis - Low-fat diet. - Refer to surgeon to discuss cholecystectomy if symptomatic.  4.  History of H. pylori infection treated in 2018 with Flagyl , Doxy, PPI, bismuth . - Order follow-up Diatherix H. pylori stool test.  5.  History of erosive gastritis and duodenitis  - Continue PPI: Omeprazole  20 mg daily - Avoid NSAIDs - Order repeat EGD if symptomatic.  6.  History of adenomatous colon polyps - Scheduling a 7-year repeat surveillance colonoscopy Scheduling Colonoscopy  with Dr. Avram I discussed risks of colonoscopy with patient to include risk  of bleeding, colon perforation, and risk of sedation.  Patient expressed understanding and agrees to proceed with colonoscopy.   Assessment and Plan Assessment & Plan       Follow up ***  Kristy Console, PA-C

## 2024-03-23 ENCOUNTER — Ambulatory Visit: Admitting: Physician Assistant

## 2024-03-27 ENCOUNTER — Encounter: Payer: Self-pay | Admitting: Physician Assistant

## 2024-03-27 ENCOUNTER — Other Ambulatory Visit

## 2024-03-27 ENCOUNTER — Ambulatory Visit (INDEPENDENT_AMBULATORY_CARE_PROVIDER_SITE_OTHER): Admitting: Physician Assistant

## 2024-03-27 VITALS — BP 130/78 | HR 67 | Ht 62.0 in | Wt 134.0 lb

## 2024-03-27 DIAGNOSIS — E559 Vitamin D deficiency, unspecified: Secondary | ICD-10-CM | POA: Diagnosis not present

## 2024-03-27 DIAGNOSIS — Z8619 Personal history of other infectious and parasitic diseases: Secondary | ICD-10-CM

## 2024-03-27 DIAGNOSIS — R748 Abnormal levels of other serum enzymes: Secondary | ICD-10-CM

## 2024-03-27 DIAGNOSIS — Z8711 Personal history of peptic ulcer disease: Secondary | ICD-10-CM

## 2024-03-27 DIAGNOSIS — K7460 Unspecified cirrhosis of liver: Secondary | ICD-10-CM

## 2024-03-27 DIAGNOSIS — Z860100 Personal history of colon polyps, unspecified: Secondary | ICD-10-CM

## 2024-03-27 DIAGNOSIS — R7989 Other specified abnormal findings of blood chemistry: Secondary | ICD-10-CM

## 2024-03-27 DIAGNOSIS — K8020 Calculus of gallbladder without cholecystitis without obstruction: Secondary | ICD-10-CM

## 2024-03-27 LAB — CBC WITH DIFFERENTIAL/PLATELET
Basophils Absolute: 0.1 K/uL (ref 0.0–0.1)
Basophils Relative: 2.2 % (ref 0.0–3.0)
Eosinophils Absolute: 0.4 K/uL (ref 0.0–0.7)
Eosinophils Relative: 7.9 % — ABNORMAL HIGH (ref 0.0–5.0)
HCT: 33.8 % — ABNORMAL LOW (ref 36.0–46.0)
Hemoglobin: 11.2 g/dL — ABNORMAL LOW (ref 12.0–15.0)
Lymphocytes Relative: 28.9 % (ref 12.0–46.0)
Lymphs Abs: 1.3 K/uL (ref 0.7–4.0)
MCHC: 33.1 g/dL (ref 30.0–36.0)
MCV: 83.5 fl (ref 78.0–100.0)
Monocytes Absolute: 0.5 K/uL (ref 0.1–1.0)
Monocytes Relative: 11.7 % (ref 3.0–12.0)
Neutro Abs: 2.3 K/uL (ref 1.4–7.7)
Neutrophils Relative %: 49.3 % (ref 43.0–77.0)
Platelets: 177 K/uL (ref 150.0–400.0)
RBC: 4.05 Mil/uL (ref 3.87–5.11)
RDW: 17.1 % — ABNORMAL HIGH (ref 11.5–15.5)
WBC: 4.7 K/uL (ref 4.0–10.5)

## 2024-03-27 LAB — COMPREHENSIVE METABOLIC PANEL WITH GFR
ALT: 13 U/L (ref 0–35)
AST: 44 U/L — ABNORMAL HIGH (ref 0–37)
Albumin: 2.7 g/dL — ABNORMAL LOW (ref 3.5–5.2)
Alkaline Phosphatase: 223 U/L — ABNORMAL HIGH (ref 39–117)
BUN: 13 mg/dL (ref 6–23)
CO2: 26 meq/L (ref 19–32)
Calcium: 8.3 mg/dL — ABNORMAL LOW (ref 8.4–10.5)
Chloride: 103 meq/L (ref 96–112)
Creatinine, Ser: 1.04 mg/dL (ref 0.40–1.20)
GFR: 52.45 mL/min — ABNORMAL LOW (ref >60.00)
Glucose, Bld: 93 mg/dL (ref 70–99)
Potassium: 3 meq/L — ABNORMAL LOW (ref 3.5–5.1)
Sodium: 136 meq/L (ref 135–145)
Total Bilirubin: 0.6 mg/dL (ref 0.2–1.2)
Total Protein: 10 g/dL — ABNORMAL HIGH (ref 6.0–8.3)

## 2024-03-27 LAB — PROTIME-INR
INR: 1.2 ratio — ABNORMAL HIGH (ref 0.8–1.0)
Prothrombin Time: 12.4 s (ref 9.6–13.1)

## 2024-03-27 LAB — VITAMIN D 25 HYDROXY (VIT D DEFICIENCY, FRACTURES): VITD: 14.94 ng/mL — ABNORMAL LOW (ref 30.00–100.00)

## 2024-03-27 LAB — GAMMA GT: GGT: 107 U/L — ABNORMAL HIGH (ref 7–51)

## 2024-03-27 MED ORDER — NA SULFATE-K SULFATE-MG SULF 17.5-3.13-1.6 GM/177ML PO SOLN: 1.0000 | Freq: Once | ORAL | 0 refills | Status: AC

## 2024-03-27 NOTE — Patient Instructions (Addendum)
 Your provider has requested that you go to the basement level for lab work before leaving today. Press B on the elevator. The lab is located at the first door on the left as you exit the elevator.  Your provider has ordered Diathe[facility] stool testing for you. You have received a kit from our office today containing all necessary supplies to complete this test. Please carefully read the stool collection instructions provided in the kit before opening the accompanying materials. In addition, be sure there is a label providing your full name and date of birth on the puritan opti-swab tube that is supplied in the kit (if you do not see a label with this information on your test tube, please make us  aware before test collection!). After completing the test, you should secure the purtian tube into the specimen biohazard bag. The [facility] E-Req sheet (including date and time of specimen collection) should be placed into the outside pocket of the specimen biohazard bag and returned to the [place] lab (basement floor of [facility]) within 3 days of collection. Please make sure to give the specimen to a staff member at the lab. DO NOT leave the specimen on the counter.   If the specimen date and time (can be found in the upper right boxed portion of the sheet) are not filled out on the E-Req sheet, the test will NOT be performed.   You have been scheduled for an Endoscopy and Colonoscopy. Please follow the written instructions given to you at your visit today.  If you use inhalers (even only as needed), please bring them with you on the day of your procedure.  DO NOT TAKE 7 DAYS PRIOR TO TEST- Trulicity (dulaglutide) Ozempic, Wegovy (semaglutide) Mounjaro (tirzepatide) Bydureon Bcise (exanatide extended release)  DO NOT TAKE 1 DAY PRIOR TO YOUR TEST Rybelsus (semaglutide) Adlyxin (lixisenatide) Victoza (liraglutide) Byetta  (exanatide) ___________________________________________________________________________  Please follow up sooner if symptoms increase or worsen __________________________________________________________________________  Due to recent changes in healthcare laws, you may see the results of your imaging and laboratory studies on MyChart before your provider has had a chance to review them.  We understand that in some cases there may be results that are confusing or concerning to you. Not all laboratory results come back in the same time frame and the provider may be waiting for multiple results in order to interpret others.  Please give us  48 hours in order for your provider to thoroughly review all the results before contacting the office for clarification of your results.   Thank you for trusting me with your gastrointestinal care!   Ellouise Console, PA-C _______________________________________________________  If your blood pressure at your visit was 140/90 or greater, please contact your primary care physician to follow up on this.  _______________________________________________________  If you are age 76 or older, your body mass index should be between 23-30. Your Body mass index is 24.51 kg/m. If this is out of the aforementioned range listed, please consider follow up with your Primary Care Provider.  If you are age 76 or younger, your body mass index should be between 19-25. Your Body mass index is 24.51 kg/m. If this is out of the aformentioned range listed, please consider follow up with your Primary Care Provider.   ________________________________________________________  The [facility] providers would like to encourage you to use MYCHART to communicate with providers for non-urgent requests or questions.  Due to long hold times on the telephone, sending your provider a  message by MYCHART may be a faster and more efficient way to get a response.  Please allow 48 business hours for a  response.  Please remember that this is for non-urgent requests.  _______________________________________________________

## 2024-03-27 NOTE — Progress Notes (Addendum)
 Kristy Console, PA-C [address] [place], [place]  [address] Phone: [phone]   Gastroenterology Consultation  Referring Provider:     Dorina Oliver RIGGERS Primary Care Physician:  Kristy Dallas, PA-C Primary Gastroenterologist:  Kristy Console, PA-C / Kristy Commander, MD  Reason for Consultation:     Elevated alkaline phosphatase, history of colon polyps        HPI:   Discussed the use of AI scribe software for clinical note transcription with the patient, who gave verbal consent to proceed.  78 year old female, new patient, is referred to evaluate elevated liver enzymes.  She has had chronically elevated alkaline phosphatase for many years (since at least 2016 with review of her chart).  She drank alcohol heavily many years ago.  She stopped drinking alcohol in 2015 after she started living with her granddaughter.  She has not had any alcohol in 10 years.  She denies family history of liver disease.  Most recent labs done 11/23/2023 showed elevated alkaline phosphatase 287, AST 45.  Normal ALT 19 and total bilirubin 0.6.  GFR 80.  Normal CBC with WBC 5.1, Hgb 13.8, MCV 92, platelet 195.  11/30/2023 RUQ ultrasound:   1. Small volume biliary sludge with a couple of small gallstones. No changes of acute cholecystitis. 2. Subtle nodular contour of the liver, as can be seen in cirrhosis.  07/2023 abdominal MRI with and without contrast: - Benign right and left kidney cysts.  No follow-up imaging recommended. - No liver lesions.  Small gallstone.  No cirrhosis or ascites was mentioned.  No biliary dilatation.  History of Present Illness She has had elevated alkaline phosphatase levels since at least 2016, with stable levels over time. A right upper quadrant ultrasound on November 30, 2023, reported a subtle nodular contour of the liver (concerning for cirrhosis) and some sludge and small gallstones in the gallbladder. An abdominal MRI in March 2025 did not mention evidence of liver  cirrhosis.  She experiences suprapubic abdominal pain that is not increasing in intensity. No pain in the right upper quadrant of the abdomen. No blood in stool, diarrhea, constipation, dysuria or blood in urine.  She denies the use of NSAIDs and only takes Tylenol  occasionally.  PMH:  hypertension, H. pylori gastritis, peptic ulcer disease, history of adenomatous colon polyps osteoporosis, rheumatoid arthritis, vertebral fractures, hyperlipidemia, asthma, gout, depression, tobacco use disorder.  11/2016 EGD by Dr. Commander: Multiple dispersed diminutive nonbleeding gastric erosions.  A few diffuse duodenal erosions without bleeding.  Erosive gastritis and duodenitis.  Biopsies positive for H. pylori.  No metaplasia.  H. pylori was treated with metronidazole , omeprazole , bismuth  subsalicylate, doxycycline .  I do not see where she had any follow-up H. pylori testing.  12/2016 screening colonoscopy by Dr. Commander: 2 small (4 mm to 6 mm) tubular adenoma polyps removed.  Excellent prep.  Sigmoid diverticulosis.  Otherwise normal.  7-year repeat (due 12/2023).  Past Medical History:  Diagnosis Date   Allergy    Arthritis    Asthma    as young person, none currently   Facial cellulitis 11/17/2012   Gout    Helicobacter pylori gastritis 12/13/2016   HTN (hypertension), malignant 11/17/2012   Hx of adenomatous colonic polyps 12/29/2016   Hyperlipidemia    medicine currently   Hypertension    Osteoporosis    Renal disorder    Tuberculosis    pt took meds for 9 months - finish meds last week     Past Surgical  History:  Procedure Laterality Date   BREAST LUMPECTOMY     c-section x 2     COLONOSCOPY     JOINT REPLACEMENT     left knee & left shoulder   left breast cyst removal     UPPER GASTROINTESTINAL ENDOSCOPY     WRIST SURGERY  2018   x2    Prior to Admission medications   Medication Sig Start Date End Date Taking? Authorizing Provider  albuterol  (VENTOLIN  HFA) 108 (90 Base) MCG/ACT  inhaler Inhale 1-2 puffs into the lungs every 6 (six) hours as needed for wheezing or shortness of breath. 07/08/23   Saguier, Dallas, PA-C  alendronate  (FOSAMAX ) 70 MG tablet Take 1 tablet (70 mg total) by mouth once a week. Take with a full glass of water on an empty stomach. 07/08/23   Saguier, Dallas, PA-C  allopurinol  (ZYLOPRIM ) 100 MG tablet Take 1 tablet (100 mg total) by mouth daily. 07/08/23   Saguier, Dallas, PA-C  amLODipine  (NORVASC ) 5 MG tablet Take 1 tablet (5 mg total) by mouth daily. 08/25/23   Saguier, Dallas, PA-C  atorvastatin  (LIPITOR) 40 MG tablet TAKE 1 TABLET BY MOUTH ONCE DAILY *PATIENT NEEDS APPOINTMENT FOR FURTHER REFILLS* 07/08/23   Saguier, Dallas, PA-C  budesonide -formoterol  (SYMBICORT ) 160-4.5 MCG/ACT inhaler Inhale 2 puffs into the lungs 2 (two) times daily. 05/28/22   Saguier, Dallas, PA-C  carvedilol  (COREG ) 3.125 MG tablet TAKE 1 TABLET BY MOUTH TWICE DAILY WITH A MEAL 11/09/23   Saguier, Dallas, PA-C  diclofenac  Sodium (VOLTAREN ) 1 % GEL APPLY 4 GRAMS TOPICALLY 4 (FOUR) TIMES DAILY. 04/13/22   Saguier, Dallas, PA-C  donepezil  (ARICEPT ) 5 MG tablet Take 1 tablet (5 mg total) by mouth at bedtime. 03/10/24   Saguier, Dallas, PA-C  fluticasone  (FLONASE ) 50 MCG/ACT nasal spray Place 1 spray into both nostrils daily. 07/08/23   Saguier, Dallas, PA-C  folic acid  (FOLVITE ) 400 MCG tablet Take 1 tablet (400 mcg total) by mouth daily. 04/13/22   Saguier, Dallas, PA-C  leflunomide (ARAVA) 10 MG tablet Take 10 mg by mouth daily. 06/07/18   Lenor Rogue, PA-C  methotrexate  (RHEUMATREX) 2.5 MG tablet Take 3 tablets by mouth weekly. 02/14/20   [provider]  mirtazapine  (REMERON ) 30 MG tablet Take 1 tablet (30 mg total) by mouth at bedtime. 07/08/23   Saguier, Dallas, PA-C  mupirocin  ointment (BACTROBAN ) 2 % Apply 1 application topically 2 (two) times daily. 02/17/19   Gershon Donnice SAUNDERS, DPM  mupirocin  ointment (BACTROBAN ) 2 % Apply 1 Application topically 2 (two) times daily. 05/28/22    Saguier, Dallas, PA-C  mupirocin  ointment (BACTROBAN ) 2 % APPLY TOPICALLY TWICE DAILY 09/09/23   Saguier, Dallas, PA-C  omeprazole  (PRILOSEC) 20 MG capsule Take 1 capsule (20 mg total) by mouth daily. 12/09/23   Saguier, Dallas, PA-C  oxybutynin  (DITROPAN ) 5 MG tablet Take 1 tablet (5 mg total) by mouth 2 (two) times daily. 07/08/23   Saguier, Dallas, PA-C  potassium chloride  (KLOR-CON  M) 10 MEQ tablet TAKE 1 TABLET BY MOUTH DAILY 09/09/23   Saguier, Dallas, PA-C  tiZANidine  (ZANAFLEX ) 4 MG tablet TAKE 1 TABLET BY MOUTH EVERY 12 HOURS AS NEEDED FOR MUSCLE SPASM 10/14/19   Newlin, Enobong, MD  triamcinolone  cream (KENALOG ) 0.1 % APPLY 1 APPLICATION TOPICALLY TWICE DAILY 03/12/23   Saguier, Dallas, PA-C    Family History  Problem Relation Age of Onset   Colon cancer Neg Hx    Esophageal cancer Neg Hx    Rectal cancer Neg Hx  Stomach cancer Neg Hx      Social History   Tobacco Use   Smoking status: Former    Current packs/day: 0.00    Average packs/day: 1 pack/day for 50.0 years (50.0 ttl pk-yrs)    Types: Cigarettes    Start date: 07/08/1967    Quit date: 07/07/2017    Years since quitting: 6.7   Smokeless tobacco: Never   Tobacco comments:    4 cigs daily  Vaping Use   Vaping status: Never Used  Substance Use Topics   Alcohol use: No   Drug use: No    Types: Marijuana    Comment: 04/14/2017    Allergies as of 03/27/2024 - Review Complete 03/27/2024  Allergen Reaction Noted   Clindamycin  Hives, Rash, and Swelling 11/17/2012   Metrizamide  11/17/2012   Motrin [ibuprofen] Other (See Comments) 11/17/2012   Iodinated contrast media Rash 11/17/2012   Milk-related compounds Diarrhea 11/15/2015   Shrimp [shellfish allergy] Hives, Swelling, and Rash 11/17/2012    Review of Systems:    All systems reviewed and negative except where noted in HPI.   Physical Exam:  BP 130/78   Pulse 67   Ht 5' 2 (1.575 m)   Wt 134 lb (60.8 kg)   SpO2 94%   BMI 24.51 kg/m  No LMP recorded.  Patient is postmenopausal.  General:   Alert,  Well-developed, well-nourished, pleasant and cooperative in NAD Lungs:  Respirations even and unlabored.  Clear throughout to auscultation.   No wheezes, crackles, or rhonchi. No acute distress. Heart:  Regular rate and rhythm; no murmurs, clicks, rubs, or gallops. Abdomen:  Normal bowel sounds.  No bruits.  Soft, and non-distended without masses, hepatosplenomegaly or hernias noted.  There is no abdominal tenderness at all today.  No guarding or rebound tenderness.    Neurologic:  Alert and oriented x3;  grossly normal neurologically.  She walks with no assistive devices.  She is able to get on and off exam table with my help. Psych:  Alert and cooperative. Normal mood and affect. Musculoskeletal: Moderate to severe arthritic deformities of her hands.   Imaging Studies: No results found.  Labs: CBC    Component Value Date/Time   WBC 5.1 11/23/2023 1621   RBC 4.50 11/23/2023 1621   HGB 13.8 11/23/2023 1621   HGB 14.9 12/05/2019 1138   HCT 41.7 11/23/2023 1621   HCT 44.3 12/05/2019 1138   PLT 195.0 11/23/2023 1621   PLT 216 12/05/2019 1138   MCV 92.8 11/23/2023 1621   MCV 91 12/05/2019 1138    CMP     Component Value Date/Time   NA 138 11/23/2023 1621   NA 139 07/15/2021 1153   K 4.0 11/23/2023 1621   CL 106 11/23/2023 1621   CO2 24 11/23/2023 1621   GLUCOSE 78 11/23/2023 1621   BUN 12 11/23/2023 1621   BUN 6 (L) 07/15/2021 1153   CREATININE 0.73 11/23/2023 1621   CREATININE 0.68 08/20/2017 1526   CALCIUM  9.4 11/23/2023 1621   CALCIUM  9.8 08/25/2023 0000   PROT 9.2 (H) 11/23/2023 1621   PROT 8.6 (H) 07/15/2021 1153   ALBUMIN 3.7 11/23/2023 1621   ALBUMIN 3.9 07/15/2021 1153   AST 45 (H) 11/23/2023 1621   ALT 19 11/23/2023 1621   ALKPHOS 287 (H) 11/23/2023 1621   BILITOT 0.6 11/23/2023 1621   BILITOT 0.4 07/15/2021 1153   GFRNONAA 63 06/03/2020 1043   GFRNONAA 89 08/20/2017 1526   GFRAA 73 06/03/2020 1043  GFRAA  103 08/20/2017 1526    Assessment and Plan:   Kristy Oliver is a 78 y.o. y/o female has been referred for:  1.  Chronically elevated alkaline phosphatase for many years (since at least 2016).  Mildly elevated AST.  Normal bilirubin and ALT. - Labs: Alkaline phosphatase isoenzymes, GGT, intact PTH and calcium , hepatic panel, ANA, AMA, ASMA, ceruloplasmin, viral hepatitis A/B/C, iron panel, ferritin, immunoglobulins, alpha-1 antitrypsin   2.  Subtle nodular contour of the liver, concerning for for cirrhosis on ultrasound, but not mentioned on MRI. - Schedule EGD to evaluate for esophageal varices.  3.  Cholelithiasis without cholecystitis: Asymptomatic.  Patient denies RUQ pain, nausea, or vomiting. - Low-fat diet. - Refer to surgeon to discuss cholecystectomy if symptomatic.  4.  History of H. pylori infection treated in 2018 with Flagyl , Doxy, PPI, bismuth . - Ordered follow-up [employer] H. pylori stool test.  5.  History of erosive gastritis and duodenitis  - Continue PPI: Omeprazole  20 mg daily - Avoid NSAIDs  6.  History of adenomatous colon polyps - Scheduling a 7-year repeat surveillance colonoscopy Scheduling Colonoscopy with Dr. Avram I discussed risks of colonoscopy with patient to include risk of bleeding, colon perforation, and risk of sedation.  Patient expressed understanding and agrees to proceed with colonoscopy.    Follow up 4 weeks after EGD and colonoscopy with Dr. Avram.  Kristy Console, PA-C   [facility] Attending    I agree with the Advanced Practitioner's note, impression and recommendations with the following additions:  The patient ended up having laboratory testing showing that she has hepatitis C, iron deficiency anemia and vitamin D deficiency and positive ANA, anti-smooth muscle antibody and an elevated IgG level.  She will come back to me in follow-up before we schedule any endoscopic procedures, we have referred her to hepatology and I have also  ordered a liver biopsy.  Kristy CHARLENA Avram, MD, [facility]OL[facility]

## 2024-04-04 LAB — ANTI-SMOOTH MUSCLE ANTIBODY, IGG: Actin (Smooth Muscle) Antibody (IGG): 53 U — ABNORMAL HIGH (ref <20)

## 2024-04-04 LAB — CERULOPLASMIN: Ceruloplasmin: 50 mg/dL — ABNORMAL HIGH (ref 14–48)

## 2024-04-04 LAB — ALKALINE PHOSPHATASE ISOENZYMES
Alkaline phosphatase (APISO): 224 U/L — ABNORMAL HIGH (ref 37–153)
Bone Isoenzymes: 17 % — ABNORMAL LOW (ref 28–66)
Intestinal Isoenzymes: 23 % (ref 1–24)
Liver Isoenzymes: 59 % (ref 25–69)
Macrohepatic isoenzymes: 0 % (ref <=0)
Placental isoenzymes: 0 % (ref <=0)

## 2024-04-04 LAB — HEPATITIS B CORE ANTIBODY, TOTAL: Hep B Core Total Ab: NONREACTIVE

## 2024-04-04 LAB — MITOCHONDRIAL ANTIBODIES: Mitochondrial M2 Ab, IgG: 23 U — ABNORMAL HIGH (ref <=20.0)

## 2024-04-04 LAB — ALPHA-1-ANTITRYPSIN: A-1 Antitrypsin, Ser: 181 mg/dL (ref 83–199)

## 2024-04-04 LAB — PTH, INTACT AND CALCIUM
Calcium: 8.8 mg/dL (ref 8.6–10.4)
PTH: 63 pg/mL (ref 16–77)

## 2024-04-04 LAB — ANTI-NUCLEAR AB-TITER (ANA TITER): ANA Titer 1: 1:1280 {titer} — ABNORMAL HIGH

## 2024-04-04 LAB — IRON,TIBC AND FERRITIN PANEL
%SAT: 11 % — ABNORMAL LOW (ref 16–45)
Ferritin: 40 ng/mL (ref 16–288)
Iron: 35 ug/dL — ABNORMAL LOW (ref 45–160)
TIBC: 306 ug/dL (ref 250–450)

## 2024-04-04 LAB — HEPATITIS B CORE ANTIBODY, IGM: Hep B C IgM: NONREACTIVE

## 2024-04-04 LAB — HEPATITIS C ANTIBODY: Hepatitis C Ab: REACTIVE — AB

## 2024-04-04 LAB — HEPATITIS A ANTIBODY, TOTAL: Hepatitis A AB,Total: REACTIVE — AB

## 2024-04-04 LAB — HEPATITIS B SURFACE ANTIGEN: Hepatitis B Surface Ag: NONREACTIVE

## 2024-04-04 LAB — HEPATITIS B SURFACE ANTIBODY,QUALITATIVE: Hep B S Ab: NONREACTIVE

## 2024-04-04 LAB — IGG: IgG (Immunoglobin G), Serum: 4694 mg/dL — ABNORMAL HIGH (ref 600–1540)

## 2024-04-04 LAB — HEPATITIS A ANTIBODY, IGM: Hep A IgM: NONREACTIVE

## 2024-04-04 LAB — ANA: Anti Nuclear Antibody (ANA): POSITIVE — AB

## 2024-04-04 LAB — HEPATITIS C RNA QUANTITATIVE
HCV Quantitative Log: 1.23 {Log_IU}/mL — ABNORMAL HIGH
HCV RNA, PCR, QN: 17 [IU]/mL — ABNORMAL HIGH

## 2024-04-05 ENCOUNTER — Other Ambulatory Visit: Payer: Self-pay | Admitting: Medical

## 2024-04-05 ENCOUNTER — Encounter: Payer: Self-pay | Admitting: Medical

## 2024-04-06 ENCOUNTER — Other Ambulatory Visit: Payer: Self-pay | Admitting: Physician Assistant

## 2024-04-06 ENCOUNTER — Ambulatory Visit: Admitting: Medical

## 2024-04-07 ENCOUNTER — Ambulatory Visit: Payer: Self-pay | Admitting: Physician Assistant

## 2024-04-07 ENCOUNTER — Ambulatory Visit: Admitting: Medical

## 2024-04-07 ENCOUNTER — Other Ambulatory Visit: Payer: Self-pay

## 2024-04-07 DIAGNOSIS — R7989 Other specified abnormal findings of blood chemistry: Secondary | ICD-10-CM

## 2024-04-07 DIAGNOSIS — E559 Vitamin D deficiency, unspecified: Secondary | ICD-10-CM

## 2024-04-07 DIAGNOSIS — R7689 Other specified abnormal immunological findings in serum: Secondary | ICD-10-CM

## 2024-04-07 DIAGNOSIS — K7460 Unspecified cirrhosis of liver: Secondary | ICD-10-CM

## 2024-04-07 DIAGNOSIS — B1920 Unspecified viral hepatitis C without hepatic coma: Secondary | ICD-10-CM

## 2024-04-07 DIAGNOSIS — R748 Abnormal levels of other serum enzymes: Secondary | ICD-10-CM

## 2024-04-07 MED ORDER — VITAMIN D (ERGOCALCIFEROL) 1.25 MG (50000 UNIT) PO CAPS: 50000.0000 [IU] | ORAL_CAPSULE | ORAL | 1 refills | Status: AC

## 2024-04-07 MED ORDER — POTASSIUM CHLORIDE CRYS ER 20 MEQ PO TBCR: 20.0000 meq | EXTENDED_RELEASE_TABLET | Freq: Every day | ORAL | 0 refills | Status: AC

## 2024-04-07 MED ORDER — FERROUS SULFATE 325 (65 FE) MG PO TABS: 325.0000 mg | ORAL_TABLET | Freq: Every day | ORAL | 2 refills | Status: AC

## 2024-04-07 NOTE — Progress Notes (Signed)
 Notify patient and her granddaughter (whom she lives with and helps with her care) labs show: 1.  Potassium is low.  It looks like patient has prescription for potassium chloride  10 mEq 1 tablet daily prescribed from her PCP.  Is she taking her potassium?  If she is taking 1 tablet daily, then increase to 2 tablets daily.  Repeat BMP lab in 2 weeks.  She also needs to follow-up with PCP to monitor potassium. 2.  Vitamin D is very low.  Please send prescription for vitamin D to take 50,000 units once per week, #12 with 1 refill.  Recheck vitamin D lab in 3months.  Also follow-up with PCP to monitor. 3.  She has iron deficiency anemia.  Start prescription for ferrous sulfate 325 mg 1 tablet once daily, #30, 2 refills.  Repeat CBC and iron panel labs in 3 months. 4.  Positive hepatitis C labs.  Refer to [facility] hepatology [facility], NP) for further treatment. 5.  Positive ANA, AMA, and ASMA.  Can be positive with autoimmune hepatitis or primary biliary cirrhosis.  Refer to hepatology for further evaluation and treatment. 6.  Positive ANA and elevated IgG levels.  Could be related to autoimmune hepatitis or other autoimmune disease.  I recommend patient see rheumatologist to further evaluate her arthritis and joint pains. 6.  She is immune to hepatitis A.  Not immune to hepatitis B.   7.  At her recent office visit I had discussed scheduling EGD and colonoscopy, however it looks like these procedures have not been scheduled yet.  Please schedule follow-up appointment with Dr. Avram in our office to discuss lab results and decide about when it is safe to do EGD and colonoscopy.  Hold off on these procedures for now due to recent abnormal lab results. 8.  I am sending a copy of all labs to Dr. Avram for review.  I also discussed this patient with Dr. Avram in office. Ellouise Console, PA-C

## 2024-04-13 ENCOUNTER — Ambulatory Visit: Admitting: Medical

## 2024-04-13 ENCOUNTER — Ambulatory Visit: Payer: Self-pay | Admitting: Medical

## 2024-04-13 VITALS — BP 130/84 | HR 77 | Temp 97.6°F | Resp 16 | Ht 62.0 in | Wt 132.4 lb

## 2024-04-13 DIAGNOSIS — E785 Hyperlipidemia, unspecified: Secondary | ICD-10-CM

## 2024-04-13 DIAGNOSIS — M05741 Rheumatoid arthritis with rheumatoid factor of right hand without organ or systems involvement: Secondary | ICD-10-CM

## 2024-04-13 DIAGNOSIS — Z8709 Personal history of other diseases of the respiratory system: Secondary | ICD-10-CM

## 2024-04-13 DIAGNOSIS — D649 Anemia, unspecified: Secondary | ICD-10-CM | POA: Diagnosis not present

## 2024-04-13 DIAGNOSIS — K7689 Other specified diseases of liver: Secondary | ICD-10-CM | POA: Diagnosis not present

## 2024-04-13 DIAGNOSIS — M05742 Rheumatoid arthritis with rheumatoid factor of left hand without organ or systems involvement: Secondary | ICD-10-CM

## 2024-04-13 DIAGNOSIS — K219 Gastro-esophageal reflux disease without esophagitis: Secondary | ICD-10-CM

## 2024-04-13 DIAGNOSIS — R748 Abnormal levels of other serum enzymes: Secondary | ICD-10-CM | POA: Diagnosis not present

## 2024-04-13 DIAGNOSIS — K7469 Other cirrhosis of liver: Secondary | ICD-10-CM | POA: Diagnosis not present

## 2024-04-13 DIAGNOSIS — M1000 Idiopathic gout, unspecified site: Secondary | ICD-10-CM

## 2024-04-13 DIAGNOSIS — I10 Essential (primary) hypertension: Secondary | ICD-10-CM

## 2024-04-13 LAB — CBC WITH DIFFERENTIAL/PLATELET
Basophils Absolute: 0.1 K/uL (ref 0.0–0.1)
Basophils Relative: 1.4 % (ref 0.0–3.0)
Eosinophils Absolute: 0.3 K/uL (ref 0.0–0.7)
Eosinophils Relative: 6.5 % — ABNORMAL HIGH (ref 0.0–5.0)
HCT: 34.1 % — ABNORMAL LOW (ref 36.0–46.0)
Hemoglobin: 11.1 g/dL — ABNORMAL LOW (ref 12.0–15.0)
Lymphocytes Relative: 24.9 % (ref 12.0–46.0)
Lymphs Abs: 1.3 K/uL (ref 0.7–4.0)
MCHC: 32.7 g/dL (ref 30.0–36.0)
MCV: 83 fl (ref 78.0–100.0)
Monocytes Absolute: 0.5 K/uL (ref 0.1–1.0)
Monocytes Relative: 10.3 % (ref 3.0–12.0)
Neutro Abs: 3 K/uL (ref 1.4–7.7)
Neutrophils Relative %: 56.9 % (ref 43.0–77.0)
Platelets: 171 K/uL (ref 150.0–400.0)
RBC: 4.1 Mil/uL (ref 3.87–5.11)
RDW: 17.2 % — ABNORMAL HIGH (ref 11.5–15.5)
WBC: 5.2 K/uL (ref 4.0–10.5)

## 2024-04-13 NOTE — Patient Instructions (Addendum)
 Cirrhosis of liver and  hepatitis reactive antibody Cirrhosis with nodular liver contour and chronic hepatitis C. Hepatitis C antibody reactive, HCV RNA levels stable/very low. No alcohol use. - Continue GI follow-up for liver biopsy and management.  Anemia Recent slight anemia noted on CBC. - Ordered CBC and iron panel to assess anemia status.  Rheumatoid arthritis Managed with methotrexate , leflunomide, and folic acid . - Continue methotrexate , leflunomide, and folic acid . - Follow up with rheumatologist.  Essential hypertension Blood pressure 130/84 mmHg, managed with amlodipine  and carvedilol . - Continue amlodipine  and carvedilol .   Idiopathic gout Managed with allopurinol . - Continue allopurinol .  Hyperlipidemia Managed with atorvastatin . - Continue atorvastatin .  Asthma - Ensure inhaler availability if needed.  General Health Maintenance Vitamin D deficiency noted. - Continue vitamin D supplementation.  Follow up  date to be determined after lab review  FL2 form to be filled out based on today visit and will fill out fmla form for family member since she is helping [place] with various office visits

## 2024-04-13 NOTE — Progress Notes (Signed)
   Subjective:    Patient ID: Kristy Oliver, female    DOB: 25-Sep-1945, 78 y.o.   MRN: [id number]  HPI   Kristy Oliver is a 78 year old female with cirrhosis, hepatitis C + antibody result, and anemia who presents for completion of an FL2 form and follow-up on her medical conditions.  She has cirrhosis with a nodular liver contour and elevated liver enzymes. She abstains from alcohol. Her hepatitis C is confirmed with a reactive antibody test and slightly elevated HCV RNA levels. Anemia was absent in the summer but noted in a recent CBC. She is vitamin D deficient and on supplementation.  Her medical history includes colon polyps, gastric ulcer, and H. pylori infection. Hypertension is monitored with a recent reading of 130/84 mmHg, managed with amlodipine  and carvedilol   Rheumatoid arthritis is treated with methotrexate , leflunomide, and folic acid . She plans to see a new rheumatologist. Gout is managed with allopurinol .  Asthma is stable with an inhaler available. Heartburn is controlled without omeprazole . She takes atorvastatin  for hyperlipidemia. In the past had concern for cognitive decline vs possible dementia. In the past started her on aricept . Her cognitive issues never worsened and on review I don't see that she has seen neurologist.     On review appears is trying to get in with  [employer] care.  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 Adult medicad.  Review of Systems  See hpi     Objective:   Physical Exam   General Mental Status- Alert. General Appearance- Not in acute distress.    Skin General: Color- Normal Color. Moisture- Normal Moisture.   Neck  No JVD.   Chest and Lung Exam Auscultation: Breath Sounds:-Normal.   Cardiovascular Auscultation:Rythm- Regular. Murmurs & Other Heart Sounds:Auscultation of the heart reveals- No Murmurs.   Abdomen Inspection:-Inspeection Normal. Palpation/Percussion:Note:No mass. Non Distended + BS, no rebound or  guarding.     Neurologic Cranial Nerve exam:- CN III-XII intact(No nystagmus), symmetric smile. Strength:- 5/5 equal and symmetric strength both upper and lower extremities.    Hands- severe RA type changes to both hands.    Assessment & Plan:     Patient Instructions  Cirrhosis of liver and  hepatitis reactive antibody Cirrhosis with nodular liver contour and chronic hepatitis C. Hepatitis C antibody reactive, HCV RNA levels stable/very low. No alcohol use. - Continue GI follow-up for liver biopsy and management.  Anemia Recent slight anemia noted on CBC. - Ordered CBC and iron panel to assess anemia status.  Rheumatoid arthritis Managed with methotrexate , leflunomide, and folic acid . - Continue methotrexate , leflunomide, and folic acid . - Follow up with rheumatologist.  Essential hypertension Blood pressure 130/84 mmHg, managed with amlodipine  and carvedilol . - Continue amlodipine  and carvedilol .   Idiopathic gout Managed with allopurinol . - Continue allopurinol .  Hyperlipidemia Managed with atorvastatin . - Continue atorvastatin .  Asthma - Ensure inhaler availability if needed.  General Health Maintenance Vitamin D deficiency noted. - Continue vitamin D supplementation.  Follow up  date to be determined after lab review  FL2 form to be filled out based on today visit and will fill out fmla form for family member since she is helping [place] with various office visits   [employer], PA-C   I personally spent a total of 45 minutes in the care of the patient today including performing a medically appropriate exam/evaluation, counseling and educating, placing orders, and documenting clinical information in the EHR.

## 2024-04-15 LAB — IRON,TIBC AND FERRITIN PANEL
%SAT: 13 % — ABNORMAL LOW (ref 16–45)
Ferritin: 42 ng/mL (ref 16–288)
Iron: 40 ug/dL — ABNORMAL LOW (ref 45–160)
TIBC: 301 ug/dL (ref 250–450)

## 2024-04-16 ENCOUNTER — Telehealth: Payer: Self-pay | Admitting: Medical

## 2024-04-16 NOTE — Telephone Encounter (Signed)
 I filled out patient fl2 form but want you to call and clarify 3 areas. If patient has bladder or bowel incontinence? Also if she needs personal care assistance. She has severe RA changes to her hands and does she need help bathing, dressing or feeing?

## 2024-04-18 ENCOUNTER — Telehealth: Payer: Self-pay

## 2024-04-18 NOTE — Telephone Encounter (Signed)
 Also included fl2 papers with fmla for pt granddaughter to pick up from front office  Called rose ann again she says she will be here tomorrow to pick up forms

## 2024-04-18 NOTE — Telephone Encounter (Signed)
 Granddaughters fmla is ready. I called and notified her that I will place papers at front office

## 2024-04-18 NOTE — Telephone Encounter (Signed)
Forms placed in front office  

## 2024-04-24 ENCOUNTER — Ambulatory Visit: Admitting: Internal Medicine

## 2024-04-25 ENCOUNTER — Other Ambulatory Visit (INDEPENDENT_AMBULATORY_CARE_PROVIDER_SITE_OTHER)

## 2024-04-25 ENCOUNTER — Encounter: Payer: Self-pay | Admitting: Internal Medicine

## 2024-04-25 ENCOUNTER — Ambulatory Visit: Admitting: Internal Medicine

## 2024-04-25 VITALS — BP 142/90 | HR 69 | Ht 60.0 in | Wt 130.8 lb

## 2024-04-25 DIAGNOSIS — B1920 Unspecified viral hepatitis C without hepatic coma: Secondary | ICD-10-CM | POA: Diagnosis not present

## 2024-04-25 DIAGNOSIS — E876 Hypokalemia: Secondary | ICD-10-CM

## 2024-04-25 DIAGNOSIS — R7689 Other specified abnormal immunological findings in serum: Secondary | ICD-10-CM

## 2024-04-25 DIAGNOSIS — R7989 Other specified abnormal findings of blood chemistry: Secondary | ICD-10-CM

## 2024-04-25 DIAGNOSIS — D509 Iron deficiency anemia, unspecified: Secondary | ICD-10-CM | POA: Diagnosis not present

## 2024-04-25 LAB — BASIC METABOLIC PANEL WITH GFR
BUN: 12 mg/dL (ref 6–23)
CO2: 28 meq/L (ref 19–32)
Calcium: 9.1 mg/dL (ref 8.4–10.5)
Chloride: 104 meq/L (ref 96–112)
Creatinine, Ser: 0.85 mg/dL (ref 0.40–1.20)
GFR: 66.78 mL/min (ref >60.00)
Glucose, Bld: 87 mg/dL (ref 70–99)
Potassium: 3.8 meq/L (ref 3.5–5.1)
Sodium: 136 meq/L (ref 135–145)

## 2024-04-25 NOTE — Progress Notes (Signed)
 Kristy Oliver 78 y.o. 01-04-1946 [id number]  Assessment & Plan:   Encounter Diagnoses  Name Primary?   Iron deficiency anemia, unspecified iron deficiency anemia type Yes   Elevated LFTs    Hepatitis C virus infection without hepatic coma, unspecified chronicity    Positive ANA (antinuclear antibody)    Hypokalemia     Proceed with planned EGD and colonoscopy to evaluate iron deficiency anemia.  Proceed with planned liver biopsy to evaluate abnormal transaminases, hepatitis C positivity and autoimmune abnormalities.  Rheumatoid arthritis could be responsible for autoimmune lab abnormalities. Prior ultrasound has suggested cirrhosis due to subtle nodular contour of liver.  Platelets are normal so I doubt cirrhosis.  If so it appears to be compensated.  EGD will help sort out portal hypertension.  Agree with stopping methotrexate  as per rheumatology.  Question relationship to liver problems.  Recheck BMP given hypokalemia  Proceed with [facility] hepatology consult when possible as well, referral in place.  CC: Saguier, [place], PA-C   Subjective:   Chief Complaint: Abnormal liver chemistries multiple abnormal lab tests following up per request  HPI Kristy Oliver is a 78 year old woman here with her granddaughter Kristy Oliver, for a follow-up because of numerous abnormalities detected at a visit with Ellouise Console, PA-C on 04/07/2024.  The patient was found to have hepatitis C antibody positive and hepatitis C RNA PCR positive and has been referred to hepatology.  She also has an ANA positive at 1280 positive AMA and positive anti-smooth muscle antibody.  Liver biopsy is ordered and scheduled.  She has iron deficiency with mild anemia and EGD and colonoscopy have been scheduled.  She is not immune to hepatitis B but is immune to hepatitis A.  Vaccination not undertaken yet.  Methotrexate  discontinued by rheumatology.  Vitamin D  was low and 50,000 units weekly prescribed, potassium was 3  potassium changed to 20 mill equivalents daily from 10 mill colons daily.  Not yet rechecked.  Patient reports no bowel habit changes significant abdominal pain nausea or vomiting or signs of bleeding.  She is aware of these problems and understands them to a degree and is ready to proceed with her workup. MTX  Allergies  Allergen Reactions   Clindamycin  Hives, Rash and Swelling   Metrizamide     unknown   Motrin [Ibuprofen] Other (See Comments)    blackout States she is only allergic to 800 mg formulation   Iodinated Contrast Media Rash    unknown unknown   Milk-Related Compounds Diarrhea   Shrimp [Shellfish Allergy] Hives, Swelling and Rash   Current Meds  Medication Sig   albuterol  (VENTOLIN  HFA) 108 (90 Base) MCG/ACT inhaler Inhale 1-2 puffs into the lungs every 6 (six) hours as needed for wheezing or shortness of breath.   alendronate  (FOSAMAX ) 70 MG tablet Take 1 tablet (70 mg total) by mouth once a week. Take with a full glass of water on an empty stomach.   allopurinol  (ZYLOPRIM ) 100 MG tablet Take 1 tablet (100 mg total) by mouth daily.   amLODipine  (NORVASC ) 5 MG tablet Take 1 tablet (5 mg total) by mouth daily. Needs appt   atorvastatin  (LIPITOR) 40 MG tablet TAKE 1 TABLET BY MOUTH ONCE DAILY *PATIENT NEEDS APPOINTMENT FOR FURTHER REFILLS*   carvedilol  (COREG ) 3.125 MG tablet TAKE 1 TABLET BY MOUTH TWICE DAILY WITH A MEAL   donepezil  (ARICEPT ) 5 MG tablet Take 1 tablet (5 mg total) by mouth at bedtime.   ferrous sulfate  325 (65 FE) MG tablet Take 1 tablet (  325 mg total) by mouth daily with breakfast.   fluticasone  (FLONASE ) 50 MCG/ACT nasal spray Place 1 spray into both nostrils daily.   folic acid  (FOLVITE ) 400 MCG tablet Take 1 tablet (400 mcg total) by mouth daily.   leflunomide (ARAVA) 10 MG tablet Take 10 mg by mouth daily.   mirtazapine  (REMERON ) 30 MG tablet Take 1 tablet (30 mg total) by mouth at bedtime.   mupirocin  ointment (BACTROBAN ) 2 % Apply 1 application  topically 2 (two) times daily.   omeprazole  (PRILOSEC) 20 MG capsule Take 1 capsule (20 mg total) by mouth daily.   oxybutynin  (DITROPAN ) 5 MG tablet Take 1 tablet (5 mg total) by mouth 2 (two) times daily.   potassium chloride  SA (KLOR-CON  M) 20 MEQ tablet Take 1 tablet (20 mEq total) by mouth daily.   tiZANidine  (ZANAFLEX ) 4 MG tablet TAKE 1 TABLET BY MOUTH EVERY 12 HOURS AS NEEDED FOR MUSCLE SPASM   triamcinolone  cream (KENALOG ) 0.1 % APPLY 1 APPLICATION TOPICALLY TWICE DAILY   Vitamin D , Ergocalciferol , (DRISDOL ) 1.25 MG (50000 UNIT) CAPS capsule Take 1 capsule (50,000 Units total) by mouth every 7 (seven) days.   Current Facility-Administered Medications for the 04/25/24 encounter (Office Visit) with Avram Lupita BRAVO, MD  Medication   0.9 %  sodium chloride  infusion   Past Medical History:  Diagnosis Date   Allergy    Arthritis    Asthma    as young person, none currently   Elevated LFTs    Facial cellulitis 11/17/2012   Gout    Helicobacter pylori gastritis 12/13/2016   HTN (hypertension), malignant 11/17/2012   Hx of adenomatous colonic polyps 12/29/2016   Hyperlipidemia    medicine currently   Hypertension    Osteoporosis    Positive ANA (antinuclear antibody)    Renal disorder    Tuberculosis    pt took meds for 9 months - finish meds last week    Vitamin D  deficiency    Past Surgical History:  Procedure Laterality Date   BREAST LUMPECTOMY     c-section x 2     COLONOSCOPY     JOINT REPLACEMENT     left knee & left shoulder   left breast cyst removal     UPPER GASTROINTESTINAL ENDOSCOPY     WRIST SURGERY  2018   x2   Social History   Social History Narrative   Patient reports she is single her granddaughter Kristy Oliver lives with her   Former smoker, no alcohol no current drug use remote history of marijuana use   family history is not on file.   Review of Systems As per HPI  Objective:   Physical Exam @BP  (!) 142/90   Pulse 69   Ht 5' (1.524 m)   Wt  130 lb 12.8 oz (59.3 kg)   BMI 25.55 kg/m @  General:  NAD elderly woman Eyes:   anicteric Lungs:  clear Heart::  S1S2 no rubs, murmurs or gallops Abdomen:  soft and nontender, BS+ Extremities: Marked rheumatoid changes of the hands and wrist with ulnar deviation and nodularity     Data Reviewed:  See HPI, rheumatology notes reviewed primary care notes reviewed labs  I spent 33 minutes of time, including in depth chart review, independent review of results as outlined above, communicating results with the patient directly, face-to-face time with the patient, coordinating care, ordering studies and medications as appropriate, and documentation.

## 2024-04-25 NOTE — Patient Instructions (Addendum)
 Please go to the basement lab for potassium recheck.  I will see you for colonoscopy and endoscopy test next week, 12/9.  I appreciate the opportunity to care for you. [name], MD, [facility]

## 2024-04-26 ENCOUNTER — Ambulatory Visit: Payer: Self-pay | Admitting: Internal Medicine

## 2024-05-02 ENCOUNTER — Other Ambulatory Visit: Payer: Self-pay

## 2024-05-02 ENCOUNTER — Encounter: Payer: Self-pay | Admitting: Internal Medicine

## 2024-05-02 ENCOUNTER — Ambulatory Visit: Admitting: Internal Medicine

## 2024-05-02 ENCOUNTER — Encounter: Payer: Self-pay | Admitting: Medical

## 2024-05-02 VITALS — BP 170/87 | HR 63 | Temp 97.2°F | Resp 16 | Ht 60.0 in | Wt 130.0 lb

## 2024-05-02 DIAGNOSIS — K5520 Angiodysplasia of colon without hemorrhage: Secondary | ICD-10-CM

## 2024-05-02 DIAGNOSIS — K31A11 Gastric intestinal metaplasia without dysplasia, involving the antrum: Secondary | ICD-10-CM

## 2024-05-02 DIAGNOSIS — K2950 Unspecified chronic gastritis without bleeding: Secondary | ICD-10-CM | POA: Diagnosis not present

## 2024-05-02 DIAGNOSIS — D509 Iron deficiency anemia, unspecified: Secondary | ICD-10-CM

## 2024-05-02 MED ORDER — SODIUM CHLORIDE 0.9 % IV SOLN: 500.0000 mL | Freq: Once | INTRAVENOUS | Status: DC

## 2024-05-02 MED ORDER — AMLODIPINE BESYLATE 5 MG PO TABS: 5.0000 mg | ORAL_TABLET | Freq: Every day | ORAL | 0 refills | Status: AC

## 2024-05-02 NOTE — Progress Notes (Deleted)
 Transferred to PACU via stretcher.  Not responding to stimulation at this time.  VSS upon leaving procedure room.

## 2024-05-02 NOTE — Progress Notes (Signed)
 Transferred to PACU via stretcher. Patient arousing to stimulation.  VSS upon leaving procedure room.

## 2024-05-02 NOTE — Progress Notes (Signed)
 History and Physical Interval Note:  05/02/2024 10:39 AM  [name]  has presented today for endoscopic procedure(s), with the diagnosis of  Encounter Diagnosis  Name Primary?   Iron deficiency anemia, unspecified iron deficiency anemia type Yes  .  The various methods of evaluation and treatment have been discussed with the patient and/or family. After consideration of risks, benefits and other options for treatment, the patient has consented to  the endoscopic procedure(s).  BP elevated today had not taken her medicines.  Will treat with metoprolol.  Slight headache otherwise asymptomatic.   The patient's history has been reviewed, patient examined, no change in status, stable for endoscopic procedure(s).  I have reviewed the patient's chart and labs.  Questions were answered to the patient's satisfaction.     [name], MD, [facility]

## 2024-05-02 NOTE — Patient Instructions (Addendum)
 I did not find any bad problems.  There are some superficial blood vessels seen, 1 in the colon and 1 possibly 2 in the stomach that could leak blood slowly.  This is likely why your iron levels have been low.  Please continue to take your iron supplementation (ferrous sulfate ).  I did take some stomach biopsies to check for a possible infection that could be present.  I will let you know about that.  I will follow-up with you again after your liver biopsy is completed.  Please take your blood pressure medicines when you get home.  I appreciate the opportunity to care for you. [name], MD, [facility]  Discharge instructions given. Handout on Diverticulosis. Resume previous medications. YOU HAD AN ENDOSCOPIC PROCEDURE TODAY AT THE [facility]:   Refer to the procedure report that was given to you for any specific questions about what was found during the examination.  If the procedure report does not answer your questions, please call your gastroenterologist to clarify.  If you requested that your care partner not be given the details of your procedure findings, then the procedure report has been included in a sealed envelope for you to review at your convenience later.  YOU SHOULD EXPECT: Some feelings of bloating in the abdomen. Passage of more gas than usual.  Walking can help get rid of the air that was put into your GI tract during the procedure and reduce the bloating. If you had a lower endoscopy (such as a colonoscopy or flexible sigmoidoscopy) you may notice spotting of blood in your stool or on the toilet paper. If you underwent a bowel prep for your procedure, you may not have a normal bowel movement for a few days.  Please Note:  You might notice some irritation and congestion in your nose or some drainage.  This is from the oxygen used during your procedure.  There is no need for concern and it should clear up in a day or so.  SYMPTOMS TO REPORT  IMMEDIATELY:  Following lower endoscopy (colonoscopy or flexible sigmoidoscopy):  Excessive amounts of blood in the stool  Significant tenderness or worsening of abdominal pains  Swelling of the abdomen that is new, acute  Fever of 100F or higher  Following upper endoscopy (EGD)  Vomiting of blood or coffee ground material  New chest pain or pain under the shoulder blades  Painful or persistently difficult swallowing  New shortness of breath  Fever of 100F or higher  Black, tarry-looking stools  For urgent or emergent issues, a gastroenterologist can be reached at any hour by calling [phone]. Do not use MyChart messaging for urgent concerns.    DIET:  We do recommend a small meal at first, but then you may proceed to your regular diet.  Drink plenty of fluids but you should avoid alcoholic beverages for 24 hours.  ACTIVITY:  You should plan to take it easy for the rest of today and you should NOT DRIVE or use heavy machinery until tomorrow (because of the sedation medicines used during the test).    FOLLOW UP: Our staff will call the number listed on your records the next business day following your procedure.  We will call around 7:15- 8:00 am to check on you and address any questions or concerns that you may have regarding the information given to you following your procedure. If we do not reach you, we will leave a message.     If any biopsies  were taken you will be contacted by phone or by letter within the next 1-3 weeks.  Please call us  at [phone] if you have not heard about the biopsies in 3 weeks.    SIGNATURES/CONFIDENTIALITY: You and/or your care partner have signed paperwork which will be entered into your electronic medical record.  These signatures attest to the fact that that the information above on your After Visit Summary has been reviewed and is understood.  Full responsibility of the confidentiality of this discharge information lies with you and/or  your care-partner.

## 2024-05-02 NOTE — Op Note (Signed)
 [facility] Patient Name: Kristy Oliver Procedure Date: 05/02/2024 10:46 AM MRN: [id number] Endoscopist: Lupita FORBES Commander , MD, [id number] Age: 78 Referring MD:  Date of Birth: 1946-01-26 Gender: Female Account #: [id number] Procedure:                Colonoscopy Indications:              Iron deficiency anemia Medicines:                Monitored Anesthesia Care Procedure:                Pre-Anesthesia Assessment:                           - Prior to the procedure, a History and Physical                            was performed, and patient medications and                            allergies were reviewed. The patient's tolerance of                            previous anesthesia was also reviewed. The risks                            and benefits of the procedure and the sedation                            options and risks were discussed with the patient.                            All questions were answered, and informed consent                            was obtained. Prior Anticoagulants: The patient has                            taken no anticoagulant or antiplatelet agents. ASA                            Grade Assessment: III - A patient with severe                            systemic disease. After reviewing the risks and                            benefits, the patient was deemed in satisfactory                            condition to undergo the procedure.                           After obtaining informed consent, the colonoscope  was passed under direct vision. Throughout the                            procedure, the patient's blood pressure, pulse, and                            oxygen saturations were monitored continuously. The                            [id number]-[id number] Colonoscope [id number] was introduced                            through the anus and advanced to the the terminal                            ileum, with identification of  the appendiceal                            orifice and IC valve. The colonoscopy was performed                            without difficulty. The patient tolerated the                            procedure well. The quality of the bowel                            preparation was good. The terminal ileum, ileocecal                            valve, appendiceal orifice, and rectum were                            photographed. The bowel preparation used was SUPREP                            via split dose instruction. Scope In: 11:17:12 AM Scope Out: 11:29:40 AM Scope Withdrawal Time: 0 hours 6 minutes 26 seconds  Total Procedure Duration: 0 hours 12 minutes 28 seconds  Findings:                 The perianal and digital rectal examinations were                            normal.                           A single small angiodysplastic lesion without                            bleeding was found in the cecum.                           The terminal ileum appeared normal.  Multiple small-mouthed diverticula were found in                            the sigmoid colon and descending colon.                           The exam was otherwise without abnormality on                            direct and retroflexion views. Complications:            No immediate complications. Estimated Blood Loss:     Estimated blood loss: none. Impression:               - A single non-bleeding colonic angiodysplastic                            lesion.                           - The examined portion of the ileum was normal.                           - Diverticulosis in the sigmoid colon and in the                            descending colon.                           - The examination was otherwise normal on direct                            and retroflexion views.                           - No specimens collected. Recommendation:           - Patient has a contact number available for                             emergencies. The signs and symptoms of potential                            delayed complications were discussed with the                            patient. Return to normal activities tomorrow.                            Written discharge instructions were provided to the                            patient.                           - Resume previous diet.                           -  Continue present medications. Stay on ferrous                            sulfate. Reserve ablation of angiodysplasia if                            hemorrhage or unable to maintain Hgb w/ iron (oral                            or parenteral)                           - No repeat colonoscopy. Lupita FORBES Commander, MD 05/02/2024 11:46:16 AM This report has been signed electronically.

## 2024-05-02 NOTE — Progress Notes (Signed)
 Patient presents today with markedly elevated BP's.  The nurses describe inability to get readings in the arms, only ankle area.  I went to the bedside, obtained a BP from right arm that was 210/109.  Patient has not taken BP meds. D/W Dr. Avram, I will administer small amount of Metoprolol and we will proceed. He is in agreement.

## 2024-05-02 NOTE — Progress Notes (Signed)
 1055 at bedside for CRNA started IV. 1058 1mg  IV Metoprolol given.

## 2024-05-02 NOTE — Op Note (Signed)
 [facility] Patient Name: Kristy Oliver Procedure Date: 05/02/2024 10:47 AM MRN: [id number] Endoscopist: Lupita FORBES Commander , MD, [id number] Age: 78 Referring MD:  Date of Birth: [birth year] Gender: Female Account #: [id number] Procedure:                Upper GI endoscopy Indications:              Iron deficiency anemia Medicines:                Monitored Anesthesia Care Procedure:                Pre-Anesthesia Assessment:                           - Prior to the procedure, a History and Physical                            was performed, and patient medications and                            allergies were reviewed. The patient's tolerance of                            previous anesthesia was also reviewed. The risks                            and benefits of the procedure and the sedation                            options and risks were discussed with the patient.                            All questions were answered, and informed consent                            was obtained. Prior Anticoagulants: The patient has                            taken no anticoagulant or antiplatelet agents. ASA                            Grade Assessment: III - A patient with severe                            systemic disease. After reviewing the risks and                            benefits, the patient was deemed in satisfactory                            condition to undergo the procedure.                           After obtaining informed consent, the endoscope was  passed under direct vision. Throughout the                            procedure, the patient's blood pressure, pulse, and                            oxygen saturations were monitored continuously. The                            GIF [id number] #[id number] was introduced through the                            mouth, and advanced to the second part of duodenum.                            The upper GI endoscopy  was accomplished without                            difficulty. The patient tolerated the procedure                            well. Scope In: Scope Out: Findings:                 Patchy mildly erythematous and eroded mucosa                            without bleeding was found in the gastric antrum.                            Biopsies were taken with a cold forceps for                            histology. Verification of patient identification                            for the specimen was done. Estimated blood loss was                            minimal.                           Two small angiodysplastic lesions with no bleeding                            were found in the gastric body and in the gastric                            antrum.                           The gastroesophageal flap valve was visualized                            endoscopically and classified as Hill Grade III                            (  minimal fold, loose to endoscope, hiatal hernia                            likely).                           The cardia and gastric fundus were normal on                            retroflexion.                           The exam was otherwise without abnormality. Complications:            No immediate complications. Estimated Blood Loss:     Estimated blood loss was minimal. Impression:               - Erythematous mucosa in the antrum. Biopsied.                           - Two non-bleeding angiodysplastic lesions in the                            stomach.                           - Gastroesophageal flap valve classified as Hill                            Grade III (minimal fold, loose to endoscope, hiatal                            hernia likely).                           - The examination was otherwise normal. Recommendation:           - See the other procedure note for documentation of                            additional recommendations. Colonoscopy next.                            - Await pathology results. Lupita FORBES Commander, MD 05/02/2024 11:41:37 AM This report has been signed electronically.

## 2024-05-02 NOTE — Progress Notes (Signed)
 Erica CMA attempted to measure BP around 6 times and unable to obtain a result. BP 240/124 (R leg) 224/116 (L leg) while lying down in bed. Patient stating her head feels like is pounding and feeling some pressure. Christy CRNA and Dr Avram made aware. Granddaughter Roseann at bedside assisting with patient questionare. Metoprolol 1mg  given by Bari CRNA. BP 151/99. Will proceed with procedure by Dr. Avram.

## 2024-05-02 NOTE — Progress Notes (Signed)
 Called to room to assist during endoscopic procedure.  Patient ID and intended procedure confirmed with present staff. Received instructions for my participation in the procedure from the performing physician.

## 2024-05-03 ENCOUNTER — Telehealth: Payer: Self-pay

## 2024-05-03 NOTE — Telephone Encounter (Signed)
°  Follow up Call-     05/02/2024   10:18 AM  Call back number  Post procedure Call Back phone  # [phone] (granddaughter)  Permission to leave phone message Yes     Patient questions:  Do you have a fever, pain , or abdominal swelling? No. Pain Score  0 *  Have you tolerated food without any problems? Yes.    Have you been able to return to your normal activities? Yes.    Do you have any questions about your discharge instructions: Diet   No. Medications  No. Follow up visit  No.  Do you have questions or concerns about your Care? No.  Actions: * If pain score is 4 or above: No action needed, pain <4.

## 2024-05-04 ENCOUNTER — Ambulatory Visit

## 2024-05-05 LAB — SURGICAL PATHOLOGY

## 2024-05-07 ENCOUNTER — Other Ambulatory Visit: Payer: Self-pay | Admitting: Radiology

## 2024-05-07 DIAGNOSIS — R7989 Other specified abnormal findings of blood chemistry: Secondary | ICD-10-CM

## 2024-05-08 NOTE — H&P (Signed)
 Chief Complaint: Elevated liver function tests, positive ANA, AMA and ASMA, elevated IgG level, hepatitis C, nodular liver on imaging concerning for cirrhosis; referred for image guided random core liver biopsy for further evaluation  Referring Provider(s): Gessner,C  Supervising Physician: Jennefer Rover  Patient Status: [facility] - Out-pt  History of Present Illness: Kristy Oliver is a 78 y.o. female ex smoker with past medical history significant for childhood asthma, anemia, gout, hypertension, hyperlipidemia, osteoporosis, rheumatoid arthritis, prior tuberculosis, vitamin D  deficiency who presents now with elevated liver function tests, positive ANA, AMA and ASMA, elevated IgG level, hepatitis C and subtle nodular liver on prior imaging ?cirrhosis.  She is scheduled today for image guided random core liver biopsy for further evaluation.  *** Patient is Full Code  Past Medical History:  Diagnosis Date   Allergy    Asthma    as young person, none currently   Elevated LFTs    Facial cellulitis 11/17/2012   Gout    Helicobacter pylori gastritis 12/13/2016   HTN (hypertension), malignant 11/17/2012   Hx of adenomatous colonic polyps 12/29/2016   Hyperlipidemia    medicine currently   Hypertension    Osteomyelitis (HCC)    Finger   Osteoporosis    Positive ANA (antinuclear antibody)    Renal disorder    Rheumatoid arthritis (HCC)    Deforming   Tuberculosis    pt took meds for 9 months - finish meds last week    Vitamin D  deficiency     Past Surgical History:  Procedure Laterality Date   BREAST LUMPECTOMY     c-section x 2     COLONOSCOPY     JOINT REPLACEMENT     left knee & left shoulder   left breast cyst removal     UPPER GASTROINTESTINAL ENDOSCOPY     WRIST SURGERY  2018   x2    Allergies: Clindamycin , Motrin [ibuprofen], Iodinated contrast media, Metrizamide, Milk-related compounds, and Shrimp [shellfish allergy]  Medications: Prior to Admission  medications  Medication Sig Start Date End Date Taking? Authorizing Provider  albuterol  (VENTOLIN  HFA) 108 (90 Base) MCG/ACT inhaler Inhale 1-2 puffs into the lungs every 6 (six) hours as needed for wheezing or shortness of breath. 07/08/23   Saguier, [place], PA-C  alendronate  (FOSAMAX ) 70 MG tablet Take 1 tablet (70 mg total) by mouth once a week. Take with a full glass of water on an empty stomach. 07/08/23   Saguier, [place], PA-C  allopurinol  (ZYLOPRIM ) 100 MG tablet Take 1 tablet (100 mg total) by mouth daily. 07/08/23   Saguier, [place], PA-C  amLODipine  (NORVASC ) 5 MG tablet Take 1 tablet (5 mg total) by mouth daily. Needs appt 05/02/24   Saguier, [place], PA-C  atorvastatin  (LIPITOR) 40 MG tablet TAKE 1 TABLET BY MOUTH ONCE DAILY *PATIENT NEEDS APPOINTMENT FOR FURTHER REFILLS* 07/08/23   Saguier, [place], PA-C  carvedilol  (COREG ) 3.125 MG tablet TAKE 1 TABLET BY MOUTH TWICE DAILY WITH A MEAL 11/09/23   Saguier, [place], PA-C  donepezil  (ARICEPT ) 5 MG tablet Take 1 tablet (5 mg total) by mouth at bedtime. 03/10/24   Saguier, [place], PA-C  ferrous sulfate  325 (65 FE) MG tablet Take 1 tablet (325 mg total) by mouth daily with breakfast. 04/07/24   [place], PA-C  fluticasone  (FLONASE ) 50 MCG/ACT nasal spray Place 1 spray into both nostrils daily. 07/08/23   Saguier, [place], PA-C  folic acid  (FOLVITE ) 400 MCG tablet Take 1 tablet (400 mcg total) by mouth daily. 04/13/22   Saguier,  [place], PA-C  leflunomide (ARAVA) 10 MG tablet Take 10 mg by mouth daily. 06/07/18   Lenor Rogue, PA-C  mirtazapine  (REMERON ) 30 MG tablet Take 1 tablet (30 mg total) by mouth at bedtime. 07/08/23   Saguier, [place], PA-C  mupirocin  ointment (BACTROBAN ) 2 % Apply 1 application topically 2 (two) times daily. 02/17/19   Gershon Donnice SAUNDERS, DPM  omeprazole  (PRILOSEC) 20 MG capsule Take 1 capsule (20 mg total) by mouth daily. 12/09/23   Saguier, [place], PA-C  oxybutynin  (DITROPAN ) 5 MG tablet Take 1 tablet (5 mg total) by mouth 2  (two) times daily. 07/08/23   Saguier, [place], PA-C  potassium chloride  SA (KLOR-CON  M) 20 MEQ tablet Take 1 tablet (20 mEq total) by mouth daily. 04/07/24   [place], PA-C  tiZANidine  (ZANAFLEX ) 4 MG tablet TAKE 1 TABLET BY MOUTH EVERY 12 HOURS AS NEEDED FOR MUSCLE SPASM 10/14/19   Newlin, Enobong, MD  triamcinolone  cream (KENALOG ) 0.1 % APPLY 1 APPLICATION TOPICALLY TWICE DAILY 03/12/23   Saguier, [place], PA-C  Vitamin D , Ergocalciferol , (DRISDOL ) 1.25 MG (50000 UNIT) CAPS capsule Take 1 capsule (50,000 Units total) by mouth every 7 (seven) days. 04/07/24   [place], PA-C     Family History  Problem Relation Age of Onset   Colon cancer Neg Hx    Esophageal cancer Neg Hx    Rectal cancer Neg Hx    Stomach cancer Neg Hx     Social History   Socioeconomic History   Marital status: Single    Spouse name: Not on file   Number of children: Not on file   Years of education: Not on file   Highest education level: 12th grade  Occupational History   Not on file  Tobacco Use   Smoking status: Former    Current packs/day: 0.00    Average packs/day: 1 pack/day for 50.0 years (50.0 ttl pk-yrs)    Types: Cigarettes    Start date: 07/08/1967    Quit date: 07/07/2017    Years since quitting: 6.8   Smokeless tobacco: Never   Tobacco comments:    4 cigs daily  Vaping Use   Vaping status: Never Used  Substance and Sexual Activity   Alcohol use: No   Drug use: No    Types: Marijuana    Comment: 04/14/2017   Sexual activity: Not Currently    Birth control/protection: I.U.D.  Other Topics Concern   Not on file  Social History Narrative   Patient reports she is single her granddaughter [name] lives with her   Former smoker, no alcohol no current drug use remote history of marijuana use   Social Drivers of Health   Tobacco Use: Medium Risk (05/02/2024)   Patient History    Smoking Tobacco Use: Former    Smokeless Tobacco Use: Never    Passive Exposure: Not on Surveyor, Minerals Strain: Low Risk (04/13/2024)   Overall Financial Resource Strain (CARDIA)    Difficulty of Paying Living Expenses: Not very hard  Food Insecurity: No Food Insecurity (04/13/2024)   Epic    Worried About Programme Researcher, Broadcasting/film/video in the Last Year: Never true    Ran Out of Food in the Last Year: Never true  Transportation Needs: No Transportation Needs (04/13/2024)   Epic    Lack of Transportation (Medical): No    Lack of Transportation (Non-Medical): No  Physical Activity: Inactive (04/13/2024)   Exercise Vital Sign    Days of Exercise per Week: 0 days  Minutes of Exercise per Session: Not on file  Stress: Stress Concern Present (04/13/2024)   [employer] of Occupational Health - Occupational Stress Questionnaire    Feeling of Stress: Rather much  Social Connections: Socially Isolated (04/13/2024)   Social Connection and Isolation Panel    Frequency of Communication with Friends and Family: Once a week    Frequency of Social Gatherings with Friends and Family: More than three times a week    Attends Religious Services: Never    Database Administrator or Organizations: No    Attends Engineer, Structural: Not on file    Marital Status: Never married  Depression (PHQ2-9): High Risk (04/13/2024)   Depression (PHQ2-9)    PHQ-2 Score: 11  Alcohol Screen: Low Risk (11/22/2023)   Alcohol Screen    Last Alcohol Screening Score (AUDIT): 1  Housing: Low Risk (04/13/2024)   Epic    Unable to Pay for Housing in the Last Year: No    Number of Times Moved in the Last Year: 0    Homeless in the Last Year: No  Utilities: Low Risk (06/11/2023)   Received from [facility]   Utilities    In the past 12 months has the electric, gas, oil, or water company threatened to shut off services in your home? : No  Health Literacy: Not on file     Review of Systems: A 12 point ROS discussed and pertinent positives are indicated in the HPI above.  All other systems are  negative.  Review of Systems  Vital Signs: There were no vitals taken for this visit.  Advance Care Plan: The advanced care place/surrogate decision maker was discussed at the time of visit and the patient did not wish to discuss or was not able to name a surrogate decision maker or provide an advance care plan.  Physical Exam  Imaging: No results found.  Labs:  CBC: Recent Labs    07/08/23 0903 11/23/23 1621 03/27/24 1455 04/13/24 1114  WBC 4.9 5.1 4.7 5.2  HGB 12.7 13.8 11.2* 11.1*  HCT 38.2 41.7 33.8* 34.1*  PLT 160.0 195.0 177.0 171.0    COAGS: Recent Labs    03/27/24 1455  INR 1.2*    BMP: Recent Labs    07/08/23 0903 08/25/23 0000 11/23/23 1621 03/27/24 1455 04/25/24 1124  NA 139  --  138 136 136  K 4.0  --  4.0 3.0* 3.8  CL 109  --  106 103 104  CO2 25  --  24 26 28   GLUCOSE 92  --  78 93 87  BUN 15  --  12 13 12   CALCIUM  9.0 9.8 9.4 8.8  8.3* 9.1  CREATININE 0.75  --  0.73 1.04 0.85    LIVER FUNCTION TESTS: Recent Labs    07/08/23 0903 11/23/23 1621 03/27/24 1455  BILITOT 0.5 0.6 0.6  AST 51* 45* 44*  ALT 29 19 13   ALKPHOS 366* 287* 223*  PROT 8.7* 9.2* 10.0*  ALBUMIN 3.5 3.7 2.7*    TUMOR MARKERS: No results for input(s): AFPTM, CEA, CA199, CHROMGRNA in the last 8760 hours.  Assessment and Plan: 78 y.o. female ex smoker with past medical history significant for childhood asthma, anemia, gout, hypertension, hyperlipidemia, osteoporosis, rheumatoid arthritis, prior tuberculosis, vitamin D  deficiency who presents now with elevated liver function tests, positive ANA, AMA and ASMA, elevated IgG level, hepatitis C and subtle nodular liver on prior imaging ?cirrhosis.  She is scheduled today for image guided  random core liver biopsy for further evaluation.Risks and benefits of procedure was discussed with the patient  including, but not limited to bleeding, infection, damage to adjacent structures or low yield requiring additional  tests.  All of the questions were answered and there is agreement to proceed.  Consent signed and in chart.    Thank you for allowing our service to participate in Ianna Salmela 's care.  Electronically Signed: D. Franky Rakers, PA-C   05/08/2024, 12:52 PM      I spent a total of  25 minutes   in face to face in clinical consultation, greater than 50% of which was counseling/coordinating care for image guided random core liver biopsy

## 2024-05-09 ENCOUNTER — Ambulatory Visit (HOSPITAL_COMMUNITY)
Admission: RE | Admit: 2024-05-09 | Discharge: 2024-05-09 | Disposition: A | Source: Ambulatory Visit | Attending: Internal Medicine

## 2024-05-09 ENCOUNTER — Ambulatory Visit: Payer: Self-pay | Admitting: Internal Medicine

## 2024-05-09 ENCOUNTER — Ambulatory Visit (HOSPITAL_COMMUNITY)
Admission: RE | Admit: 2024-05-09 | Discharge: 2024-05-09 | Disposition: A | Source: Ambulatory Visit | Attending: Internal Medicine | Admitting: Internal Medicine

## 2024-05-09 ENCOUNTER — Encounter (HOSPITAL_COMMUNITY): Payer: Self-pay

## 2024-05-09 ENCOUNTER — Other Ambulatory Visit: Payer: Self-pay

## 2024-05-09 DIAGNOSIS — K739 Chronic hepatitis, unspecified: Secondary | ICD-10-CM | POA: Insufficient documentation

## 2024-05-09 DIAGNOSIS — R7989 Other specified abnormal findings of blood chemistry: Secondary | ICD-10-CM | POA: Insufficient documentation

## 2024-05-09 DIAGNOSIS — I10 Essential (primary) hypertension: Secondary | ICD-10-CM | POA: Diagnosis not present

## 2024-05-09 DIAGNOSIS — R748 Abnormal levels of other serum enzymes: Secondary | ICD-10-CM | POA: Insufficient documentation

## 2024-05-09 DIAGNOSIS — M069 Rheumatoid arthritis, unspecified: Secondary | ICD-10-CM | POA: Diagnosis not present

## 2024-05-09 DIAGNOSIS — Z8611 Personal history of tuberculosis: Secondary | ICD-10-CM | POA: Insufficient documentation

## 2024-05-09 DIAGNOSIS — E785 Hyperlipidemia, unspecified: Secondary | ICD-10-CM | POA: Diagnosis not present

## 2024-05-09 DIAGNOSIS — M810 Age-related osteoporosis without current pathological fracture: Secondary | ICD-10-CM | POA: Insufficient documentation

## 2024-05-09 DIAGNOSIS — Z87891 Personal history of nicotine dependence: Secondary | ICD-10-CM | POA: Insufficient documentation

## 2024-05-09 DIAGNOSIS — B1920 Unspecified viral hepatitis C without hepatic coma: Secondary | ICD-10-CM

## 2024-05-09 LAB — CBC WITH DIFFERENTIAL/PLATELET
Abs Immature Granulocytes: 0.01 K/uL (ref 0.00–0.07)
Basophils Absolute: 0.1 K/uL (ref 0.0–0.1)
Basophils Relative: 1 %
Eosinophils Absolute: 0.2 K/uL (ref 0.0–0.5)
Eosinophils Relative: 4 %
HCT: 36.4 % (ref 36.0–46.0)
Hemoglobin: 11.7 g/dL — ABNORMAL LOW (ref 12.0–15.0)
Immature Granulocytes: 0 %
Lymphocytes Relative: 26 %
Lymphs Abs: 1.4 K/uL (ref 0.7–4.0)
MCH: 27 pg (ref 26.0–34.0)
MCHC: 32.1 g/dL (ref 30.0–36.0)
MCV: 84.1 fL (ref 80.0–100.0)
Monocytes Absolute: 0.8 K/uL (ref 0.1–1.0)
Monocytes Relative: 15 %
Neutro Abs: 2.8 K/uL (ref 1.7–7.7)
Neutrophils Relative %: 54 %
Platelets: 180 K/uL (ref 150–400)
RBC: 4.33 MIL/uL (ref 3.87–5.11)
RDW: 18.5 % — ABNORMAL HIGH (ref 11.5–15.5)
WBC: 5.3 K/uL (ref 4.0–10.5)
nRBC: 0 % (ref 0.0–0.2)

## 2024-05-09 LAB — COMPREHENSIVE METABOLIC PANEL WITH GFR
ALT: 17 U/L (ref 0–44)
AST: 60 U/L — ABNORMAL HIGH (ref 15–41)
Albumin: 2.9 g/dL — ABNORMAL LOW (ref 3.5–5.0)
Alkaline Phosphatase: 250 U/L — ABNORMAL HIGH (ref 38–126)
Anion gap: 9 (ref 5–15)
BUN: 18 mg/dL (ref 8–23)
CO2: 20 mmol/L — ABNORMAL LOW (ref 22–32)
Calcium: 8.9 mg/dL (ref 8.9–10.3)
Chloride: 107 mmol/L (ref 98–111)
Creatinine, Ser: 1.12 mg/dL — ABNORMAL HIGH (ref 0.44–1.00)
GFR, Estimated: 51 mL/min — ABNORMAL LOW (ref >60)
Glucose, Bld: 99 mg/dL (ref 70–99)
Potassium: 5.5 mmol/L — ABNORMAL HIGH (ref 3.5–5.1)
Sodium: 135 mmol/L (ref 135–145)
Total Bilirubin: 1.1 mg/dL (ref 0.0–1.2)
Total Protein: 10 g/dL — ABNORMAL HIGH (ref 6.5–8.1)

## 2024-05-09 LAB — PROTIME-INR
INR: 1.1 (ref 0.8–1.2)
Prothrombin Time: 14.8 s (ref 11.4–15.2)

## 2024-05-09 MED ORDER — FENTANYL CITRATE (PF) 100 MCG/2ML IJ SOLN
INTRAMUSCULAR | Status: AC
  Filled 2024-05-09: qty 2

## 2024-05-09 MED ORDER — MIDAZOLAM HCL 2 MG/2ML IJ SOLN
INTRAMUSCULAR | Status: AC
  Filled 2024-05-09: qty 2

## 2024-05-09 MED ORDER — MIDAZOLAM HCL (PF) 2 MG/2ML IJ SOLN
INTRAMUSCULAR | Status: AC | PRN
  Administered 2024-05-09: 13:00:00 1 mg via INTRAVENOUS

## 2024-05-09 MED ORDER — GELATIN ABSORBABLE 12-7 MM EX MISC
CUTANEOUS | Status: AC
  Filled 2024-05-09: qty 1

## 2024-05-09 MED ORDER — LIDOCAINE HCL 1 % IJ SOLN
INTRAMUSCULAR | Status: AC
  Filled 2024-05-09: qty 20

## 2024-05-09 MED ORDER — FENTANYL CITRATE (PF) 100 MCG/2ML IJ SOLN
INTRAMUSCULAR | Status: AC | PRN
  Administered 2024-05-09: 13:00:00 50 ug via INTRAVENOUS

## 2024-05-09 MED ORDER — SODIUM CHLORIDE 0.9 % IV SOLN: INTRAVENOUS | Status: DC

## 2024-05-09 NOTE — Discharge Instructions (Signed)
Please call Interventional Radiology clinic [phone] with any questions or concerns.  You may remove your dressing and shower tomorrow.  After the procedure, it is common to have: Soreness Bruising Mild pain You may also feel tired for a few days  Follow these instructions at home:  Medication: Do not use Aspirin or ibuprofen products, such as Advil or Motrin, as it may increase bleeding.  You may resume your usual medications as ordered by your doctor If your doctor prescribed antibiotics, take them as directed. Do not stop taking them just because you feel better. You need to take the full course of antibiotics.  Eating and drinking: Drink plenty of liquids to keep your urine pale yellow You can resume your regular diet as directed by your doctor  Care of the procedure site Follow instructions from your doctor about how to take care of your cut from surgery (incisions). Make sure you: Wash your hands with soap and water for at least 20 seconds before and after you change your bandage. If you cannot use soap and water, use hand sanitizer Change your bandage Leave stitches or skin glue in place for at least two weeks Leave tape strips alone unless you are told to take them off. You may trim the edges of the tape strips if they curl up. Check your incision every day for signs of infection. Check for: Redness, swelling, or more pain Fluid or blood Warmth Pus or a bad smell  Activity Rest at home for 1-2 days, or as told by your doctor Get up to take short walks every 1 to 2 hours. Ask for help if you feel weak or unsteady Do not lift anything that is heavier than 10 lb (4.5 kg), or the limit that you are told Do not play contact sports for 2 weeks after the procedure Return to your normal activities as told by your doctor Do not take baths, swim, or use a hot tub until your health care provider approves. Take showers only. Keep all follow-up visits as told by your  doctor  Contact a doctor if: You have more bleeding in your incision Your incision swells, or is red and more painful You have fluid that comes from your incision You develop a rash You have fever or chills  Get help right away if: You have swelling, bloating, or pain in your belly (abdomen) You get dizzy or faint You vomit or you feel like vomiting You have trouble breathing or feel short of breath You have chest pain You have problems talking or seeing You have trouble with your balance or moving your arms or legs  Moderate Conscious Sedation-Care After  This sheet gives you information about how to care for yourself after your procedure. Your health care provider may also give you more specific instructions. If you have problems or questions, contact your health care provider.  After the procedure, it is common to have: Sleepiness for several hours. Impaired judgment for several hours. Difficulty with balance. Vomiting if you eat too soon.  Follow these instructions at home:  Rest. Do not participate in activities where you could fall or become injured. Do not drive or use machinery. Do not drink alcohol. Do not take sleeping pills or medicines that cause drowsiness. Do not make important decisions or sign legal documents. Do not take care of children on your own.  Eating and drinking Follow the diet recommended by your health care provider. Drink enough fluid to keep your urine pale yellow.  If you vomit: Drink water, juice, or soup when you can drink without vomiting. Make sure you have little or no nausea before eating solid foods.  General instructions Take over-the-counter and prescription medicines only as told by your health care provider. Have a responsible adult stay with you for the time you are told. It is important to have someone help care for you until you are awake and alert. Do not smoke. Keep all follow-up visits as told by your health care  provider. This is important.  Contact a health care provider if: You are still sleepy or having trouble with balance after 24 hours. You feel light-headed. You keep feeling nauseous or you keep vomiting. You develop a rash. You have a fever. You have redness or swelling around the IV site.  Get help right away if: You have trouble breathing. You have new-onset confusion at home.  This information is not intended to replace advice given to you by your health care provider. Make sure you discuss any questions you have with your healthcare provider.

## 2024-05-09 NOTE — Procedures (Signed)
Interventional Radiology Procedure Note  Procedure: Ultrasound guided liver biopsy   Findings: Please refer to procedural dictation for full description. 18 ga core x2 from right lobe.  Gelfoam slurry needle track embolization.  Complications: None immediate  Estimated Blood Loss: < 5 ml  Recommendations: Strict 3 hour bedrest. Follow Pathology results.   Marliss Coots, MD

## 2024-05-09 NOTE — Sedation Documentation (Signed)
 RN Brittain Hosie pulled 2 mg Versed  and 100 mcg Fentanyl  in Us  pysix. Pt. Received 1 mg Versed  and 50 mcg Fentanyl  throughout the procedure. RN Tayen Narang wasted  1 mg Versed  and  50 mcg Fentanyl  with Rolan RN in IR med room.

## 2024-05-12 ENCOUNTER — Ambulatory Visit: Admitting: Physician Assistant

## 2024-05-13 LAB — SURGICAL PATHOLOGY

## 2024-05-16 ENCOUNTER — Ambulatory Visit: Payer: Self-pay | Admitting: Internal Medicine

## 2024-05-16 DIAGNOSIS — B1920 Unspecified viral hepatitis C without hepatic coma: Secondary | ICD-10-CM | POA: Insufficient documentation

## 2024-05-16 NOTE — Telephone Encounter (Signed)
 Reviewed liver biopsy results with granddaughter [name].  Active inflammation and some fibrosis.  I have reviewed this with [facility] NP and it is thought that the autoimmune features are most likely coming from the hepatitis C.  The plan will be to treat the hepatitis C and reassess with labs.  I explained to [facility] that we have some time and that of February appointment is okay.  That is when she is scheduled to be seen at [facility].   I will have them come the week of January 5 and recheck LFTs and also get a hepatitis C genotype.

## 2024-06-07 ENCOUNTER — Other Ambulatory Visit: Payer: Self-pay

## 2024-06-07 DIAGNOSIS — D509 Iron deficiency anemia, unspecified: Secondary | ICD-10-CM

## 2024-06-07 NOTE — Telephone Encounter (Signed)
 Pts granddaughter aware and will bring her for labs, orders in epic.

## 2024-06-07 NOTE — Telephone Encounter (Signed)
-----   Message from Nurse Rock DEL, RN sent at 04/07/2024  3:23 PM EST ----- Regarding: Labs Pt needs CBC and IBCF, need to enter orders

## 2024-08-08 ENCOUNTER — Ambulatory Visit: Admitting: Internal Medicine
